# Patient Record
Sex: Male | Born: 1970 | Race: White | Hispanic: No | Marital: Married | Smoking: Never smoker
Health system: Southern US, Community
[De-identification: ages and names within clinical notes are randomized; demographics above are authoritative.]

## PROBLEM LIST (undated history)

## (undated) DIAGNOSIS — F909 Attention-deficit hyperactivity disorder, unspecified type: Secondary | ICD-10-CM

## (undated) DIAGNOSIS — I10 Essential (primary) hypertension: Secondary | ICD-10-CM

## (undated) DIAGNOSIS — I639 Cerebral infarction, unspecified: Secondary | ICD-10-CM

## (undated) DIAGNOSIS — E785 Hyperlipidemia, unspecified: Secondary | ICD-10-CM

## (undated) HISTORY — DX: Cerebral infarction, unspecified: I63.9

## (undated) HISTORY — DX: Hyperlipidemia, unspecified: E78.5

## (undated) HISTORY — PX: TONSILECTOMY/ADENOIDECTOMY WITH MYRINGOTOMY: SHX6125

---

## 2018-01-13 MED FILL — VYVANSE 50 MG CAPSULE: 50 | 30 days supply | Qty: 30 | Fill #0

## 2018-01-26 DIAGNOSIS — Z638 Other specified problems related to primary support group: Secondary | ICD-10-CM | POA: Diagnosis not present

## 2018-01-26 DIAGNOSIS — Z6911 Encounter for mental health services for victim of spousal or partner abuse: Secondary | ICD-10-CM | POA: Diagnosis not present

## 2018-01-26 DIAGNOSIS — F411 Generalized anxiety disorder: Secondary | ICD-10-CM | POA: Diagnosis not present

## 2018-02-01 DIAGNOSIS — Z6911 Encounter for mental health services for victim of spousal or partner abuse: Secondary | ICD-10-CM | POA: Diagnosis not present

## 2018-02-01 DIAGNOSIS — F411 Generalized anxiety disorder: Secondary | ICD-10-CM | POA: Diagnosis not present

## 2018-02-01 DIAGNOSIS — Z638 Other specified problems related to primary support group: Secondary | ICD-10-CM | POA: Diagnosis not present

## 2018-02-10 MED FILL — valACYclovir HCL 1 GM TABS: 1 | 10 days supply | Qty: 30 | Fill #0

## 2018-02-10 MED FILL — DOXYCYCLINE HYCLATE 100 MG: 100 | 10 days supply | Qty: 20 | Fill #0

## 2018-02-10 MED FILL — VYVANSE 50 MG CAPSULE: 50 | 30 days supply | Qty: 30 | Fill #0

## 2018-02-15 DIAGNOSIS — Z6911 Encounter for mental health services for victim of spousal or partner abuse: Secondary | ICD-10-CM | POA: Diagnosis not present

## 2018-02-15 DIAGNOSIS — Z638 Other specified problems related to primary support group: Secondary | ICD-10-CM | POA: Diagnosis not present

## 2018-02-15 DIAGNOSIS — F411 Generalized anxiety disorder: Secondary | ICD-10-CM | POA: Diagnosis not present

## 2018-02-22 DIAGNOSIS — Z638 Other specified problems related to primary support group: Secondary | ICD-10-CM | POA: Diagnosis not present

## 2018-02-22 DIAGNOSIS — Z6911 Encounter for mental health services for victim of spousal or partner abuse: Secondary | ICD-10-CM | POA: Diagnosis not present

## 2018-02-22 DIAGNOSIS — F411 Generalized anxiety disorder: Secondary | ICD-10-CM | POA: Diagnosis not present

## 2018-02-24 DIAGNOSIS — R030 Elevated blood-pressure reading, without diagnosis of hypertension: Secondary | ICD-10-CM | POA: Diagnosis not present

## 2018-02-24 DIAGNOSIS — R079 Chest pain, unspecified: Secondary | ICD-10-CM | POA: Diagnosis not present

## 2018-02-24 DIAGNOSIS — R0609 Other forms of dyspnea: Secondary | ICD-10-CM | POA: Diagnosis not present

## 2018-02-24 DIAGNOSIS — E782 Mixed hyperlipidemia: Secondary | ICD-10-CM | POA: Diagnosis not present

## 2018-03-08 DIAGNOSIS — Z6911 Encounter for mental health services for victim of spousal or partner abuse: Secondary | ICD-10-CM | POA: Diagnosis not present

## 2018-03-08 DIAGNOSIS — Z638 Other specified problems related to primary support group: Secondary | ICD-10-CM | POA: Diagnosis not present

## 2018-03-08 DIAGNOSIS — F411 Generalized anxiety disorder: Secondary | ICD-10-CM | POA: Diagnosis not present

## 2018-03-11 MED FILL — ROSUVASTATIN CALCIUM 20 MG: 20 | 30 days supply | Qty: 30 | Fill #0

## 2018-03-15 MED FILL — VYVANSE 50 MG CAPSULE: 50 | 30 days supply | Qty: 30 | Fill #0

## 2018-03-16 DIAGNOSIS — Z6911 Encounter for mental health services for victim of spousal or partner abuse: Secondary | ICD-10-CM | POA: Diagnosis not present

## 2018-03-16 DIAGNOSIS — F411 Generalized anxiety disorder: Secondary | ICD-10-CM | POA: Diagnosis not present

## 2018-03-16 DIAGNOSIS — Z638 Other specified problems related to primary support group: Secondary | ICD-10-CM | POA: Diagnosis not present

## 2018-03-22 DIAGNOSIS — R0789 Other chest pain: Secondary | ICD-10-CM | POA: Diagnosis not present

## 2018-03-22 DIAGNOSIS — Z6911 Encounter for mental health services for victim of spousal or partner abuse: Secondary | ICD-10-CM | POA: Diagnosis not present

## 2018-03-22 DIAGNOSIS — Z638 Other specified problems related to primary support group: Secondary | ICD-10-CM | POA: Diagnosis not present

## 2018-03-22 DIAGNOSIS — F411 Generalized anxiety disorder: Secondary | ICD-10-CM | POA: Diagnosis not present

## 2018-03-29 MED FILL — JORNAY PM 20 MG CP24: 20 | 30 days supply | Qty: 30 | Fill #0

## 2018-04-05 DIAGNOSIS — I10 Essential (primary) hypertension: Secondary | ICD-10-CM | POA: Diagnosis not present

## 2018-04-05 DIAGNOSIS — R079 Chest pain, unspecified: Secondary | ICD-10-CM | POA: Diagnosis not present

## 2018-04-06 DIAGNOSIS — R030 Elevated blood-pressure reading, without diagnosis of hypertension: Secondary | ICD-10-CM | POA: Diagnosis not present

## 2018-04-06 DIAGNOSIS — E782 Mixed hyperlipidemia: Secondary | ICD-10-CM | POA: Diagnosis not present

## 2018-04-08 DIAGNOSIS — R079 Chest pain, unspecified: Secondary | ICD-10-CM | POA: Diagnosis not present

## 2018-04-08 DIAGNOSIS — E782 Mixed hyperlipidemia: Secondary | ICD-10-CM | POA: Diagnosis not present

## 2018-04-08 DIAGNOSIS — R0609 Other forms of dyspnea: Secondary | ICD-10-CM | POA: Diagnosis not present

## 2018-04-08 DIAGNOSIS — I10 Essential (primary) hypertension: Secondary | ICD-10-CM | POA: Diagnosis not present

## 2018-04-08 MED FILL — VALSARTAN 160 MG TABLET: 160 | 30 days supply | Qty: 30 | Fill #0

## 2018-04-13 MED FILL — VYVANSE 50 MG CAPSULE: 50 | 30 days supply | Qty: 30 | Fill #0

## 2018-04-19 DIAGNOSIS — I10 Essential (primary) hypertension: Secondary | ICD-10-CM | POA: Diagnosis not present

## 2018-04-30 DIAGNOSIS — R079 Chest pain, unspecified: Secondary | ICD-10-CM | POA: Diagnosis not present

## 2018-04-30 DIAGNOSIS — E782 Mixed hyperlipidemia: Secondary | ICD-10-CM | POA: Diagnosis not present

## 2018-05-03 DIAGNOSIS — Z638 Other specified problems related to primary support group: Secondary | ICD-10-CM | POA: Diagnosis not present

## 2018-05-03 DIAGNOSIS — F411 Generalized anxiety disorder: Secondary | ICD-10-CM | POA: Diagnosis not present

## 2018-05-03 DIAGNOSIS — Z6911 Encounter for mental health services for victim of spousal or partner abuse: Secondary | ICD-10-CM | POA: Diagnosis not present

## 2018-05-04 MED FILL — ROSUVASTATIN CALCIUM 20 MG: 20 | 30 days supply | Qty: 30 | Fill #1

## 2018-05-05 MED FILL — VALSARTAN 160 MG TABLET: 160 | 30 days supply | Qty: 30 | Fill #1

## 2018-05-10 MED FILL — VYVANSE 50 MG CAPSULE: 50 | 30 days supply | Qty: 30 | Fill #0

## 2018-06-08 MED FILL — VYVANSE 50 MG CAPSULE: 50 | 30 days supply | Qty: 30 | Fill #0

## 2018-07-02 MED FILL — ROSUVASTATIN CALCIUM 20 MG: 20 | 90 days supply | Qty: 90 | Fill #0

## 2018-07-02 MED FILL — VALSARTAN 160 MG TABLET: 160 | 90 days supply | Qty: 90 | Fill #0

## 2018-07-06 MED FILL — VYVANSE 50 MG CAPSULE: 50 | 30 days supply | Qty: 30 | Fill #0

## 2018-07-20 DIAGNOSIS — Z6911 Encounter for mental health services for victim of spousal or partner abuse: Secondary | ICD-10-CM | POA: Diagnosis not present

## 2018-07-20 DIAGNOSIS — F411 Generalized anxiety disorder: Secondary | ICD-10-CM | POA: Diagnosis not present

## 2018-07-20 DIAGNOSIS — Z638 Other specified problems related to primary support group: Secondary | ICD-10-CM | POA: Diagnosis not present

## 2018-08-03 MED FILL — VYVANSE 50 MG CAPSULE: 50 | 30 days supply | Qty: 30 | Fill #0

## 2018-09-06 MED FILL — VYVANSE 50 MG CAPSULE: 50 | 30 days supply | Qty: 30 | Fill #0

## 2018-10-06 MED FILL — VYVANSE 50 MG CAPSULE: 50 | 30 days supply | Qty: 30 | Fill #0

## 2018-11-03 MED FILL — VYVANSE 60 MG CAPSULE: 60 | 30 days supply | Qty: 30 | Fill #0

## 2018-12-07 MED FILL — VYVANSE 60 MG CAPSULE: 60 | 30 days supply | Qty: 30 | Fill #0

## 2018-12-17 DIAGNOSIS — E785 Hyperlipidemia, unspecified: Secondary | ICD-10-CM | POA: Diagnosis not present

## 2018-12-17 DIAGNOSIS — I10 Essential (primary) hypertension: Secondary | ICD-10-CM | POA: Diagnosis not present

## 2018-12-17 DIAGNOSIS — Z0000 Encounter for general adult medical examination without abnormal findings: Secondary | ICD-10-CM | POA: Diagnosis not present

## 2018-12-24 ENCOUNTER — Other Ambulatory Visit: Payer: Self-pay

## 2018-12-24 ENCOUNTER — Other Ambulatory Visit: Payer: Self-pay | Admitting: Nurse Practitioner

## 2018-12-24 ENCOUNTER — Ambulatory Visit
Admission: RE | Admit: 2018-12-24 | Discharge: 2018-12-24 | Disposition: A | Discharge status: Home / Self Care | Payer: 59 | Source: Ambulatory Visit | Attending: Nurse Practitioner | Admitting: Nurse Practitioner

## 2018-12-24 DIAGNOSIS — M25522 Pain in left elbow: Secondary | ICD-10-CM

## 2018-12-24 DIAGNOSIS — S59902A Unspecified injury of left elbow, initial encounter: Secondary | ICD-10-CM | POA: Diagnosis not present

## 2019-01-08 MED FILL — VYVANSE 50 MG CAPSULE: 50 | 30 days supply | Qty: 30 | Fill #0

## 2019-02-08 MED FILL — VYVANSE 50 MG CAPSULE: 50 | 30 days supply | Qty: 30 | Fill #0

## 2019-03-01 DIAGNOSIS — M25522 Pain in left elbow: Secondary | ICD-10-CM | POA: Diagnosis not present

## 2019-03-01 DIAGNOSIS — M7712 Lateral epicondylitis, left elbow: Secondary | ICD-10-CM | POA: Diagnosis not present

## 2019-03-11 MED FILL — VYVANSE 50 MG CAPSULE: 50 | 30 days supply | Qty: 30 | Fill #0

## 2019-04-11 MED FILL — VYVANSE 50 MG CAPSULE: 50 | 30 days supply | Qty: 30 | Fill #0

## 2019-05-10 MED FILL — VYVANSE 50 MG CAPSULE: 50 | 30 days supply | Qty: 30 | Fill #0

## 2019-06-09 MED FILL — VYVANSE 50 MG CAPSULE: 50 | 30 days supply | Qty: 30 | Fill #0

## 2019-07-08 MED FILL — VYVANSE 50 MG CAPSULE: 50 | 30 days supply | Qty: 30 | Fill #0

## 2019-08-06 MED FILL — VYVANSE 50 MG CAPSULE: 50 | 30 days supply | Qty: 30 | Fill #0

## 2019-09-03 MED FILL — VYVANSE 50 MG CAPSULE: 50 | 30 days supply | Qty: 30 | Fill #0

## 2019-09-15 DIAGNOSIS — R079 Chest pain, unspecified: Secondary | ICD-10-CM | POA: Diagnosis not present

## 2019-09-15 DIAGNOSIS — I10 Essential (primary) hypertension: Secondary | ICD-10-CM | POA: Diagnosis not present

## 2019-09-15 DIAGNOSIS — R41840 Attention and concentration deficit: Secondary | ICD-10-CM | POA: Diagnosis not present

## 2019-09-15 DIAGNOSIS — R1084 Generalized abdominal pain: Secondary | ICD-10-CM | POA: Diagnosis not present

## 2019-09-19 ENCOUNTER — Other Ambulatory Visit: Payer: Self-pay | Admitting: Primary Care

## 2019-09-19 DIAGNOSIS — R1084 Generalized abdominal pain: Secondary | ICD-10-CM

## 2019-10-03 MED FILL — VYVANSE 50 MG CAPSULE: 50 | 30 days supply | Qty: 30 | Fill #0

## 2019-11-04 MED FILL — VYVANSE 50 MG CAPSULE: 50 | 30 days supply | Qty: 30 | Fill #0

## 2019-12-02 MED FILL — VYVANSE 50 MG CAPSULE: 50 | 30 days supply | Qty: 30 | Fill #0

## 2019-12-26 MED FILL — POLYMYXIN B/TMP EYE DROPS: 10000-0.1 | 7 days supply | Qty: 10 | Fill #0

## 2019-12-31 MED FILL — VYVANSE 50 MG CAPSULE: 50 | 30 days supply | Qty: 30 | Fill #0

## 2020-01-31 MED FILL — VYVANSE 50 MG CAPSULE: 50 | 30 days supply | Qty: 30 | Fill #0

## 2020-02-11 ENCOUNTER — Other Ambulatory Visit: Payer: Self-pay

## 2020-02-11 ENCOUNTER — Emergency Department (HOSPITAL_COMMUNITY)
Admission: EM | Admit: 2020-02-11 | Discharge: 2020-02-12 | Disposition: A | Discharge status: Home / Self Care | Payer: 59 | Attending: Emergency Medicine | Admitting: Emergency Medicine

## 2020-02-11 ENCOUNTER — Encounter (HOSPITAL_COMMUNITY): Payer: Self-pay | Admitting: Emergency Medicine

## 2020-02-11 DIAGNOSIS — Z01818 Encounter for other preprocedural examination: Secondary | ICD-10-CM | POA: Diagnosis present

## 2020-02-11 DIAGNOSIS — I10 Essential (primary) hypertension: Secondary | ICD-10-CM | POA: Insufficient documentation

## 2020-02-11 DIAGNOSIS — F4324 Adjustment disorder with disturbance of conduct: Secondary | ICD-10-CM | POA: Insufficient documentation

## 2020-02-11 DIAGNOSIS — R000 Tachycardia, unspecified: Secondary | ICD-10-CM | POA: Insufficient documentation

## 2020-02-11 DIAGNOSIS — R45851 Suicidal ideations: Secondary | ICD-10-CM

## 2020-02-11 DIAGNOSIS — Z046 Encounter for general psychiatric examination, requested by authority: Secondary | ICD-10-CM | POA: Diagnosis not present

## 2020-02-11 DIAGNOSIS — F909 Attention-deficit hyperactivity disorder, unspecified type: Secondary | ICD-10-CM | POA: Diagnosis not present

## 2020-02-11 DIAGNOSIS — R259 Unspecified abnormal involuntary movements: Secondary | ICD-10-CM | POA: Diagnosis not present

## 2020-02-11 DIAGNOSIS — F4320 Adjustment disorder, unspecified: Secondary | ICD-10-CM

## 2020-02-11 HISTORY — DX: Attention-deficit hyperactivity disorder, unspecified type: F90.9

## 2020-02-11 HISTORY — DX: Essential (primary) hypertension: I10

## 2020-02-11 LAB — RAPID URINE DRUG SCREEN, HOSP PERFORMED
Amphetamines: POSITIVE — AB
Barbiturates: NOT DETECTED
Benzodiazepines: NOT DETECTED
Cocaine: NOT DETECTED
Opiates: NOT DETECTED
Tetrahydrocannabinol: NOT DETECTED

## 2020-02-11 LAB — COMPREHENSIVE METABOLIC PANEL
ALT: 20 U/L (ref 0–44)
AST: 17 U/L (ref 15–41)
Albumin: 4.9 g/dL (ref 3.5–5.0)
Alkaline Phosphatase: 59 U/L (ref 38–126)
Anion gap: 11 (ref 5–15)
BUN: 12 mg/dL (ref 6–20)
CO2: 26 mmol/L (ref 22–32)
Calcium: 8.9 mg/dL (ref 8.9–10.3)
Chloride: 100 mmol/L (ref 98–111)
Creatinine, Ser: 1.06 mg/dL (ref 0.61–1.24)
GFR calc Af Amer: >60 mL/min (ref >60)
GFR calc non Af Amer: >60 mL/min (ref >60)
Glucose, Bld: 98 mg/dL (ref 70–99)
Potassium: 2.9 mmol/L — ABNORMAL LOW (ref 3.5–5.1)
Sodium: 137 mmol/L (ref 135–145)
Total Bilirubin: 0.9 mg/dL (ref 0.3–1.2)
Total Protein: 8 g/dL (ref 6.5–8.1)

## 2020-02-11 LAB — SALICYLATE LEVEL: Salicylate Lvl: <7 mg/dL — ABNORMAL LOW (ref 7.0–30.0)

## 2020-02-11 LAB — ETHANOL: Alcohol, Ethyl (B): <10 mg/dL (ref <10)

## 2020-02-11 LAB — ACETAMINOPHEN LEVEL: Acetaminophen (Tylenol), Serum: <10 ug/mL — ABNORMAL LOW (ref 10–30)

## 2020-02-11 MED ORDER — POTASSIUM CHLORIDE CRYS ER 20 MEQ PO TBCR
40.0000 meq | EXTENDED_RELEASE_TABLET | Freq: Once | ORAL | Status: AC
  Administered 2020-02-12: 40 meq via ORAL
  Filled 2020-02-11: qty 2

## 2020-02-11 NOTE — ED Notes (Signed)
Patient here with [employer] who states that the patient has threatened to harm himself and his wife, and has destroyed items in his home.  Per IVC paperwork patient wrote a letter to his wife " I hate you and you hate me so let's go kill each other".

## 2020-02-11 NOTE — ED Notes (Signed)
Patient states to this RN that he did not mean the things that he said to his wife, states that he he said them out of frustration.

## 2020-02-11 NOTE — ED Provider Notes (Signed)
[facility]-EMERGENCY DEPT Provider Note   CSN: [id number] Arrival date & time: 02/11/20  2153     History Chief Complaint  Patient presents with  . Medical Clearance    Charles Farley is a 49 y.o. male with past medical history significant for ADHD, hypertension.  HPI Patient presents to emergency department today in [employer] custody after his wife took out IVC orders earlier this evening.  Patient states he has been fighting with his wife frequently.  She travels for work and he is bothered that she is away from the home so much. She returned home from work tonight and started to say things that bothered patient.  He states "she knows what to say to push my buttons." He denies any drug or alcohol use.  IVC paperwork states patient wrote a letter to his wife saying I hate you and you hate me so let's go kill each other.  He denies this.  He states he sent this in a text message x4 days ago.  He denies any current suicidal or homicidal ideations currently.  It was also noted on IVC paperwork that patient had become angry in the home in turned over many items.  He had also threatened to harm family members.      Past Medical History:  Diagnosis Date  . ADHD   . Hypertension     There are no problems to display for this patient.    No family history on file.  Social History   Tobacco Use  . Smoking status: Never Smoker  . Smokeless tobacco: Never Used  Substance Use Topics  . Alcohol use: Not Currently  . Drug use: Never    Home Medications Prior to Admission medications   Not on File    Allergies    Patient has no known allergies.  Review of Systems   Review of Systems  All other systems are reviewed and are negative for acute change except as noted in the HPI.   Physical Exam Updated Vital Signs BP (!) 168/93 (BP Location: Right Arm)   Pulse (!) 110   Temp 98.2 F (36.8 C) (Oral)   Resp 18   Ht 6' (1.829 m)   Wt 129.3 kg   SpO2 98%    BMI 38.65 kg/m   Physical Exam Vitals and nursing note reviewed.  Constitutional:      General: He is not in acute distress.    Appearance: He is not ill-appearing.  HENT:     Head: Normocephalic and atraumatic.     Right Ear: Tympanic membrane and external ear normal.     Left Ear: Tympanic membrane and external ear normal.     Nose: Nose normal.     Mouth/Throat:     Mouth: Mucous membranes are moist.     Pharynx: Oropharynx is clear.  Eyes:     General: No scleral icterus.       Right eye: No discharge.        Left eye: No discharge.     Extraocular Movements: Extraocular movements intact.     Conjunctiva/sclera: Conjunctivae normal.     Pupils: Pupils are equal, round, and reactive to light.  Neck:     Vascular: No JVD.  Cardiovascular:     Rate and Rhythm: Regular rhythm. Tachycardia present.     Pulses: Normal pulses.          Radial pulses are 2+ on the right side and 2+ on the left  side.     Heart sounds: Normal heart sounds.  Pulmonary:     Comments: Lungs clear to auscultation in all fields. Symmetric chest rise. No wheezing, rales, or rhonchi. Abdominal:     Comments: Abdomen is soft, non-distended, and non-tender in all quadrants. No rigidity, no guarding. No peritoneal signs.  Musculoskeletal:        General: Normal range of motion.     Cervical back: Normal range of motion.  Skin:    General: Skin is warm and dry.     Capillary Refill: Capillary refill takes less than 2 seconds.  Neurological:     Mental Status: He is oriented to person, place, and time.     GCS: GCS eye subscore is 4. GCS verbal subscore is 5. GCS motor subscore is 6.     Comments: Fluent speech, no facial droop.  Psychiatric:        Mood and Affect: Affect is inappropriate.        Speech: Speech normal.        Behavior: Behavior normal.        Thought Content: Thought content does not include homicidal or suicidal ideation. Thought content does not include homicidal or suicidal plan.         Judgment: Judgment is impulsive.     ED Results / Procedures / Treatments   Labs (all labs ordered are listed, but only abnormal results are displayed) Labs Reviewed  COMPREHENSIVE METABOLIC PANEL - Abnormal; Notable for the following components:      Result Value   Potassium 2.9 (*)    All other components within normal limits  SALICYLATE LEVEL - Abnormal; Notable for the following components:   Salicylate Lvl <7.0 (*)    All other components within normal limits  ACETAMINOPHEN LEVEL - Abnormal; Notable for the following components:   Acetaminophen (Tylenol), Serum <10 (*)    All other components within normal limits  CBC - Abnormal; Notable for the following components:   WBC 15.7 (*)    All other components within normal limits  RAPID URINE DRUG SCREEN, HOSP PERFORMED - Abnormal; Notable for the following components:   Amphetamines POSITIVE (*)    All other components within normal limits  ETHANOL    EKG None  Radiology No results found.  Procedures Procedures (including critical care time)  Medications Ordered in ED Medications  potassium chloride SA (KLOR-CON) CR tablet 40 mEq (40 mEq Oral Given 02/12/20 0024)    ED Course  I have reviewed the triage vital signs and the nursing notes.  Pertinent labs & imaging results that were available during my care of the patient were reviewed by me and considered in my medical decision making (see chart for details).    MDM Rules/Calculators/A&P                          History provided by patient with additional history obtained from chart review.    No medical complaints today.  Labs ordered and reviewed. CMP shows hypokalemia 2.9, replaced with PO. Nonspecific leukocytosis, 15.7. He is afebrile and denies any infectious symptoms. When vitals rechecked tachycardia has resolved. UDS positive for amphetamines. First exam filed out.  Pt is medically cleared for TTS evaluation, disposition per [facility].   History  provided by patient with additional history obtained from chart review.     Final Clinical Impression(s) / ED Diagnoses Final diagnoses:  Involuntary commitment    Rx / DC  Orders ED Discharge Orders    None       Kathyrn Lass 02/12/20 Lindi Adie, MD 02/12/20 [phone]

## 2020-02-11 NOTE — ED Notes (Signed)
ED Provider at bedside. 

## 2020-02-11 NOTE — ED Triage Notes (Signed)
Patient arrives IVC'd by wife. Patient states that him and his wife have been arguing due to her traveling a lot. Patient states that they got into it today and patient wrote a letter to his wife that stated, "I hate you and you hate me so let's go kill each other." Patient admits to writing this note due to being upset and he did not mean it. Patient denies SI/HI at this time.

## 2020-02-11 NOTE — ED Notes (Signed)
Placed Pt's shirt, pants, shoes, phone, keys, and wallet in a Pt's belongings bag and put away.

## 2020-02-12 DIAGNOSIS — F4324 Adjustment disorder with disturbance of conduct: Secondary | ICD-10-CM | POA: Diagnosis not present

## 2020-02-12 DIAGNOSIS — F909 Attention-deficit hyperactivity disorder, unspecified type: Secondary | ICD-10-CM

## 2020-02-12 DIAGNOSIS — R45851 Suicidal ideations: Secondary | ICD-10-CM

## 2020-02-12 DIAGNOSIS — R000 Tachycardia, unspecified: Secondary | ICD-10-CM | POA: Diagnosis not present

## 2020-02-12 DIAGNOSIS — F4320 Adjustment disorder, unspecified: Secondary | ICD-10-CM | POA: Diagnosis not present

## 2020-02-12 DIAGNOSIS — Z046 Encounter for general psychiatric examination, requested by authority: Secondary | ICD-10-CM | POA: Diagnosis not present

## 2020-02-12 DIAGNOSIS — I10 Essential (primary) hypertension: Secondary | ICD-10-CM | POA: Diagnosis not present

## 2020-02-12 LAB — CBC
HCT: 45.4 % (ref 39.0–52.0)
Hemoglobin: 15.6 g/dL (ref 13.0–17.0)
MCH: 28.8 pg (ref 26.0–34.0)
MCHC: 34.4 g/dL (ref 30.0–36.0)
MCV: 83.9 fL (ref 80.0–100.0)
Platelets: 355 10*3/uL (ref 150–400)
RBC: 5.41 MIL/uL (ref 4.22–5.81)
RDW: 13.3 % (ref 11.5–15.5)
WBC: 15.7 10*3/uL — ABNORMAL HIGH (ref 4.0–10.5)
nRBC: 0 % (ref 0.0–0.2)

## 2020-02-12 NOTE — ED Provider Notes (Signed)
Psychiatry called report that patient has been psychiatrically cleared and they feel he safe for discharge home.  They request hours in the IVC.  I assessed the patient and he denies any complaints and is resting comfortably.  He agrees with discharge home.  Patient was given resources and we discharged after IVC is rescinded.     Charles Farley, Canary Brim, MD 02/12/20 1455

## 2020-02-12 NOTE — BH Assessment (Signed)
Tele Assessment Note   Patient Name: Charles Farley MRN: [id number] Referring Physician: Doug Sou, PA-C Location of Patient: [facility] ED Location of Provider: Behavioral Health TTS Department  Charles Farley is a 49 y.o. male who was brought involuntarily by police to [facility] under an IVC order that was filed by pt's wife. The IVC states:  "Respondent has been diagnosed with ADHD. Today he threatened to kill himself however, he doesn't have an active plan. He wrote a letter to his wife "I hate you and you hate me so let's go kill each other." He destroyed the home by knocking over items. Respondent is verbally aggressive. He threatened to inflict bodily harm to family members."  When asked why he was brought to the hospital, pt states, "My wife and I have been having some issues. She came home an dI could tell she was upset and I asked her what was going on and I started arguing with her." Pt acknowledges he sent her a text message 3-4 days ago stating "I kill you, you kill me, then we're both dead;" he states the believes she chose to go to the magistrate today with this note because they had been arguing again today.  Pt denies SI or a hx of SI. He denies any prior attempts to kill himself or any plan to kill himself. He denies he's ever been hospitalized for mental health reasons. Pt denies HI or a hx of HI. He denies AVH, NSSIB, access to guns/weapons, engagement with the legal system, or SA.  Clinician attempted to make contact w/ pt's wife at the number provided in the IVC paperwork at [phone] but there was no answer and no voicemail available to leave a HIPAA-compliant voicemail message.   Pt is oriented x5. His recent and remote memory is intact. Pt was cooperative throughout the assessment process. Pt's insight and judgement is fair - poor; his impulse control is poor at this time.   Diagnosis: F43.24, Adjustment disorder, With disturbance of conduct   Past Medical History:  Past  Medical History:  Diagnosis Date  . ADHD   . Hypertension     Family History: No family history on file.  Social History:  reports that he has never smoked. He has never used smokeless tobacco. He reports previous alcohol use. He reports that he does not use drugs.  Additional Social History:  Alcohol / Drug Use Pain Medications: Please see MAR Prescriptions: Please see MAR Over the Counter: Please see MAR History of alcohol / drug use?: No history of alcohol / drug abuse Longest period of sobriety (when/how long): Please see MAR  CIWA: CIWA-Ar BP: (!) 193/91 Pulse Rate: 86 COWS:    Allergies: No Known Allergies  Home Medications: (Not in a hospital admission)   OB/GYN Status:  No LMP for male patient.  General Assessment Data Location of Assessment: [facility] ED TTS Assessment: In system Is this a Tele or Face-to-Face Assessment?: Tele Assessment Is this an Initial Assessment or a Re-assessment for this encounter?: Initial Assessment Patient Accompanied by:: N/A Language Other than English: No Living Arrangements: Other (Comment) (Pt lives at home with his wife) What gender do you identify as?: Male Date Telepsych consult ordered in [facility]: 02/11/20 Time Telepsych consult ordered in [facility]: 2328 Marital status: Married Living Arrangements: Spouse/significant other Can pt return to current living arrangement?: Yes Admission Status: Involuntary Petitioner: Family member Is patient capable of signing voluntary admission?: Yes Referral Source: Self/Family/Friend Insurance type: [employer]  Crisis Care Plan Living Arrangements: Spouse/significant other Legal Guardian: Other: (Self) Name of Psychiatrist: None Name of Therapist: None  Education Status Is patient currently in school?: Yes Current Grade: College Highest grade of school patient has completed: College Name of school: N/A Contact person: Self IEP information if applicable: N/A  Risk to self with  the past 6 months Suicidal Ideation:  (Pt denies; pt states made empty threat re: SI/HI w/ wife) Has patient been a risk to self within the past 6 months prior to admission? : No Suicidal Intent:  (Pt denies; pt states made empty threat re: SI/HI w/ wife) Has patient had any suicidal intent within the past 6 months prior to admission? : No Is patient at risk for suicide?:  (Pt denies; pt states made empty threat re: SI/HI w/ wife) Suicidal Plan?:  (Pt denies; pt states made empty threat re: SI/HI w/ wife) Has patient had any suicidal plan within the past 6 months prior to admission? : No Access to Means: No What has been your use of drugs/alcohol within the last 12 months?: Pt denies SA Previous Attempts/Gestures: No How many times?: 0 Other Self Harm Risks: Pt has been having marital difficulties Triggers for Past Attempts: None known Intentional Self Injurious Behavior: None Family Suicide History: No Recent stressful life event(s): Conflict (Comment), Financial Problems (Marital conflict w/ wife) Persecutory voices/beliefs?: No Depression: No Depression Symptoms: Guilt, Feeling worthless/self pity, Feeling angry/irritable Substance abuse history and/or treatment for substance abuse?: No Suicide prevention information given to non-admitted patients: Not applicable  Risk to Others within the past 6 months Homicidal Ideation:  (Pt denies; pt states made empty threat re: SI/HI w/ wife) Does patient have any lifetime risk of violence toward others beyond the six months prior to admission? : No Thoughts of Harm to Others: Yes-Currently Present (Pt acknowledges he & wife have exchanged terrible words) Comment - Thoughts of Harm to Others: Pt acknowledges he and his wife have been saying terrible things to one another due to marrital discord Current Homicidal Intent:  (Pt denies; he & wife have said things they don't mean) Current Homicidal Plan: No Access to Homicidal Means:  (Pt denies  access to guns/weapons) Identified Victim: None noted History of harm to others?:  (Pt denied hx of harm to others) Assessment of Violence: On admission Violent Behavior Description: None noted Does patient have access to weapons?: No (Pt denid having access to guns/weapons) Criminal Charges Pending?: No Does patient have a court date: No Is patient on probation?: No  Psychosis Hallucinations: Auditory Delusions: None noted  Mental Status Report Appearance/Hygiene: Unable to Assess Eye Contact: Unable to Assess Motor Activity: Unable to assess Speech: Logical/coherent Level of Consciousness: Alert Mood: Anxious, Pleasant Affect: Appropriate to circumstance Anxiety Level: Minimal Thought Processes: Coherent, Relevant Judgement: Partial Orientation: Person, Place, Time, Situation Obsessive Compulsive Thoughts/Behaviors: None  Cognitive Functioning Concentration: Fair Memory: Recent Intact, Remote Intact Is patient IDD: No Insight: Fair Impulse Control: Poor Appetite: Good Have you had any weight changes? : No Change Sleep: No Change Total Hours of Sleep: 7 (6-8) Vegetative Symptoms: None  ADLScreening [facility] Assessment Services) Patient's cognitive ability adequate to safely complete daily activities?: Yes Patient able to express need for assistance with ADLs?: Yes Independently performs ADLs?: Yes (appropriate for developmental age)  Prior Inpatient Therapy Prior Inpatient Therapy: No  Prior Outpatient Therapy Prior Outpatient Therapy: Yes Prior Therapy Dates: 3-4 years ago Prior Therapy Facilty/Provider(s): Pt cannot remember; had a therapist Reason for Treatment: Unknown Does  patient have an ACCT team?: No Does patient have Intensive In-House Services?  : No Does patient have [facility] services? : No Does patient have P4CC services?: No  ADL Screening (condition at time of admission) Patient's cognitive ability adequate to safely complete daily activities?:  Yes Is the patient deaf or have difficulty hearing?: No Does the patient have difficulty seeing, even when wearing glasses/contacts?: No Does the patient have difficulty concentrating, remembering, or making decisions?: No Patient able to express need for assistance with ADLs?: Yes Does the patient have difficulty dressing or bathing?: No Independently performs ADLs?: Yes (appropriate for developmental age) Does the patient have difficulty walking or climbing stairs?: No Weakness of Legs: None Weakness of Arms/Hands: None  Home Assistive Devices/Equipment Home Assistive Devices/Equipment: None  Therapy Consults (therapy consults require a physician order) PT Evaluation Needed: No OT Evalulation Needed: No SLP Evaluation Needed: No Abuse/Neglect Assessment (Assessment to be complete while patient is alone) Abuse/Neglect Assessment Can Be Completed: Yes Physical Abuse: Denies Verbal Abuse: Denies Sexual Abuse: Denies Exploitation of patient/patient's resources: Denies Self-Neglect: Denies Values / Beliefs Cultural Requests During Hospitalization: None Spiritual Requests During Hospitalization: None Consults Spiritual Care Consult Needed: No Transition of Care Team Consult Needed: No Advance Directives (For Healthcare) Does Patient Have a Medical Advance Directive?: No Would patient like information on creating a medical advance directive?: No - Patient declined          Disposition: Elenore Paddy, NP, reviewed pt's chart and information and determined pt should be observed overnight for safety and stability and re-assessed by psychiatry in the morning. This information was given to pt's providers at 0207.   Disposition Initial Assessment Completed for this Encounter: Yes Patient referred to: Other (Comment) (Pt will be observed overnight for safety and stability)  This service was provided via telemedicine using a 2-way, interactive audio and video technology.  Names  of all persons participating in this telemedicine service and their role in this encounter. Name: Charles Farley Role: Patient  Name: Elenore Paddy Role: Nurse Practitioner  Name: Kerman Passey Role: Clinician    Ralph Dowdy 02/12/2020 5:45 AM

## 2020-02-12 NOTE — Progress Notes (Signed)
Per Ophelia Shoulder NP, pt is pscyh clear; pt's wife has no safety concerns regarding pt returning home and pt is not endorsing SI/HI. Outpatient marriage counseling, individual therapy, and psychiatry assessment recommended.  CSW provided the following resources for patient (AVS/Discharge Instructions):   Marriage and Couples Counseling Resources that accept UMR insurance:  Zoe Lan, MS, CRC, LPC - [facility] Evelene Croon is one of the leading marriage counselors in the region of [place]. [address], [address], [place], [place] [address] DIRECTIONS Phone:  [phone]  Cell: [phone]  [facility], [facility] (individual and marriage counseling) [address]  [place], [place] [address] Call [facility] [phone]  STEVEN Evelena Peat, M.ED, NCC, LPC, [place], LCAS - [facility] [address] [place], [place] [address] Phone:  [phone]  [facility] health Outpatient (psychiatric medication management AND therapy offerred) [address] [place], [place], [place] [address] Phone: [phone]    Ethel Rana. Alan Ripper, MSW, LCSW Clinical Social Worker 02/12/2020 11:36 AM

## 2020-02-12 NOTE — Discharge Instructions (Signed)
Marriage and Couples Counseling Resources that accept UMR insurance:  Charles Lan, MS, CRC, LPC - [facility] is one of the leading marriage counselors in the region of [place]. [address], [place], [place] [address] DIRECTIONS Phone:  [phone]  Cell: [phone]  [facility], [facility] (individual and marriage counseling) [address]  [place], [place] [address] Call [facility] [phone]  STEVEN Evelena Peat, M.ED, NCC, LPC, [place], LCAS - [facility] [address] [place], [place] [address] Phone:  [phone]  [facility] health Outpatient (psychiatric medication management AND therapy offerred) [address] [place], [place] [address] Phone: [phone]

## 2020-02-12 NOTE — ED Notes (Signed)
Pt DC off unit to home per provider. Pt alert, calm, cooperative, no s/s of distress. DC information and resources given to reviewed with pt, pt knowledged understanding. Pt ambulatory off the unit, escorted by RN. Pt transported by family.
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
## 2020-02-12 NOTE — ED Notes (Signed)
Pt provided with an update on plan of care.  Report given to [facility] and Pt transferred to TCU.  1 belongings bag placed in [place] #30.

## 2020-02-12 NOTE — ED Notes (Signed)
Pt to room 30. Pt cooperative, no s/s of distress.

## 2020-02-13 IMAGING — CR LEFT ELBOW - COMPLETE 3+ VIEW
4 series · 4 of 4 positions shown · non-contrast
Comparison: None.

CLINICAL DATA: 47-year-old male with fall and trauma to the left
elbow. Continued pain in the lateral epicondyle.

EXAM:
LEFT ELBOW - COMPLETE 3+ VIEW

[x elbow ap left]
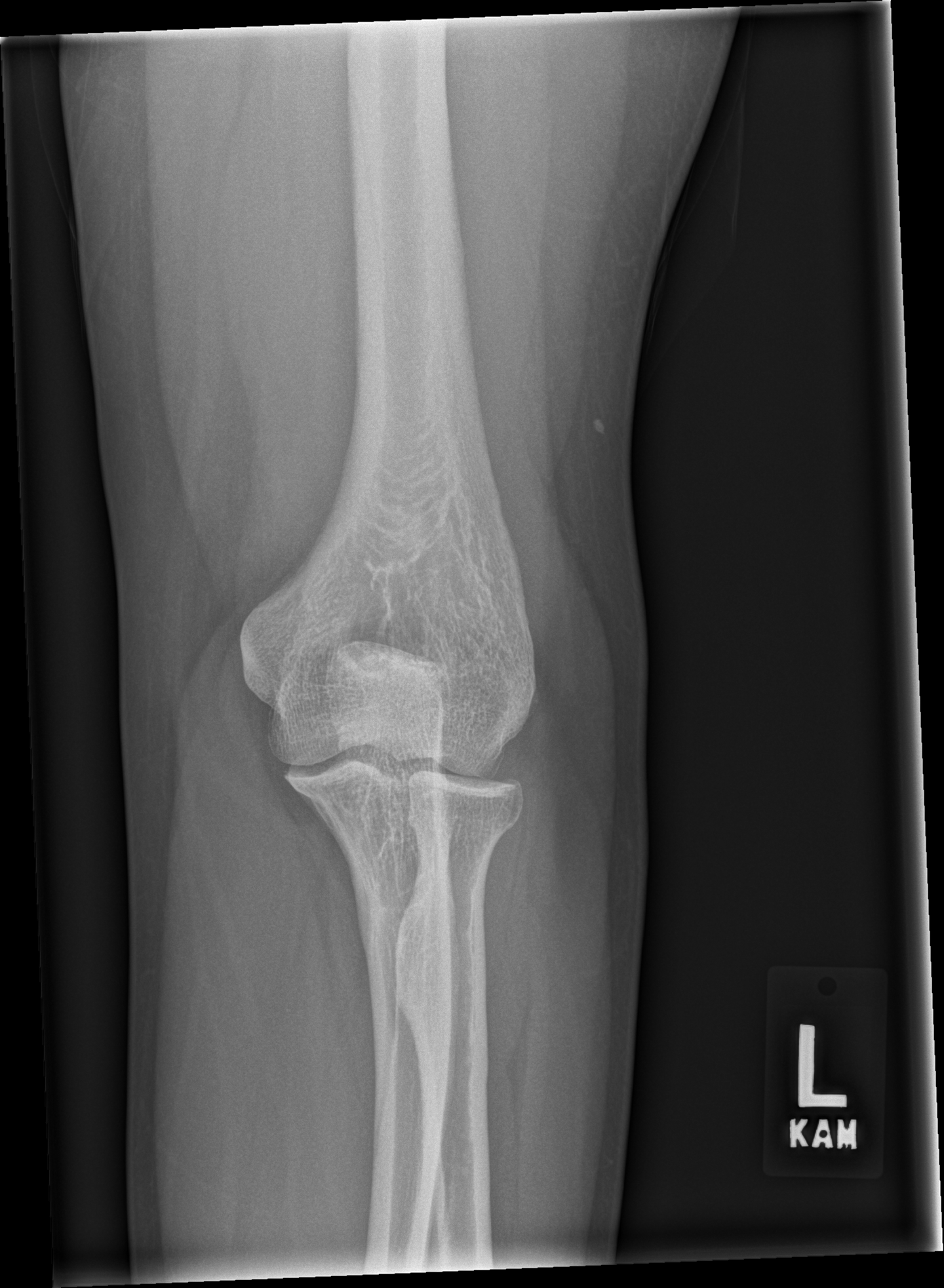

[x elbow obl left (1 of 2)]
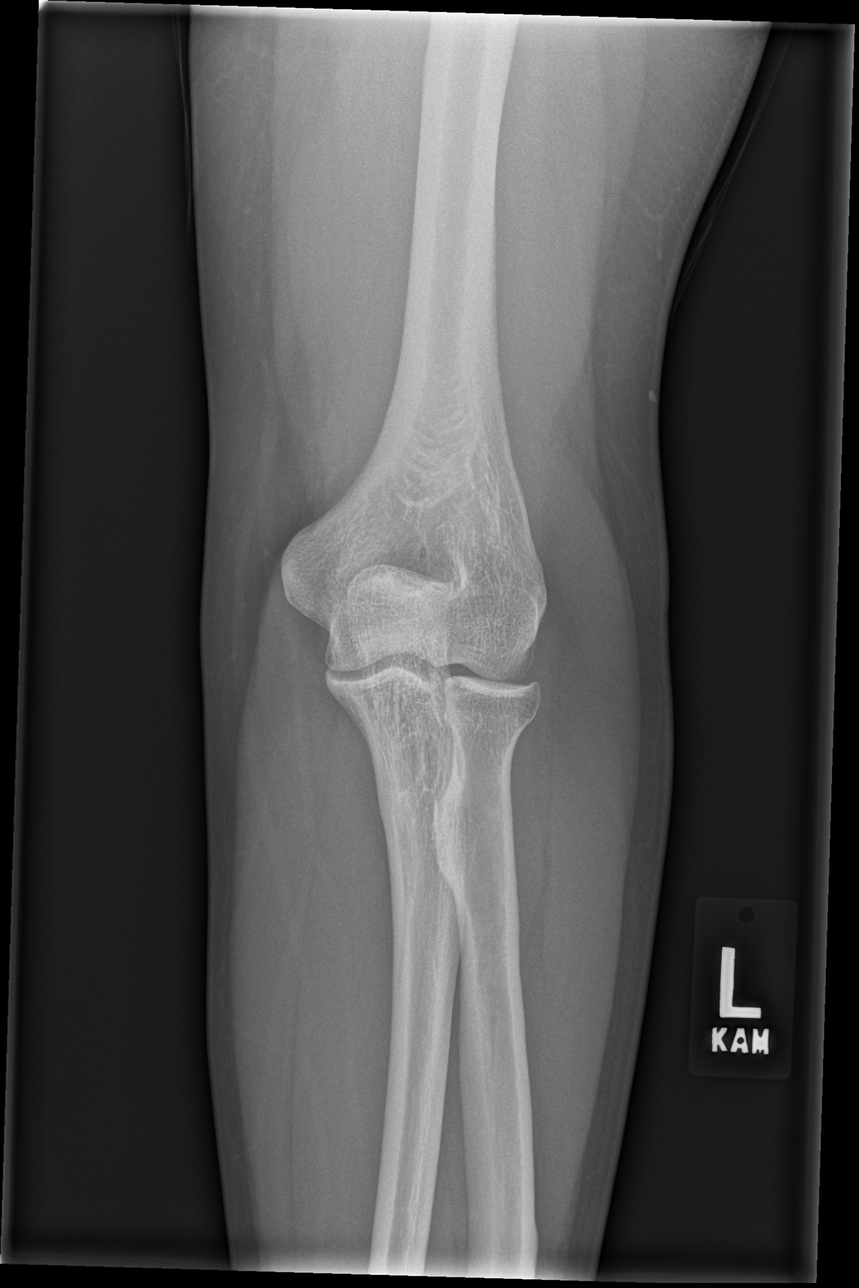

[x elbow lat left]
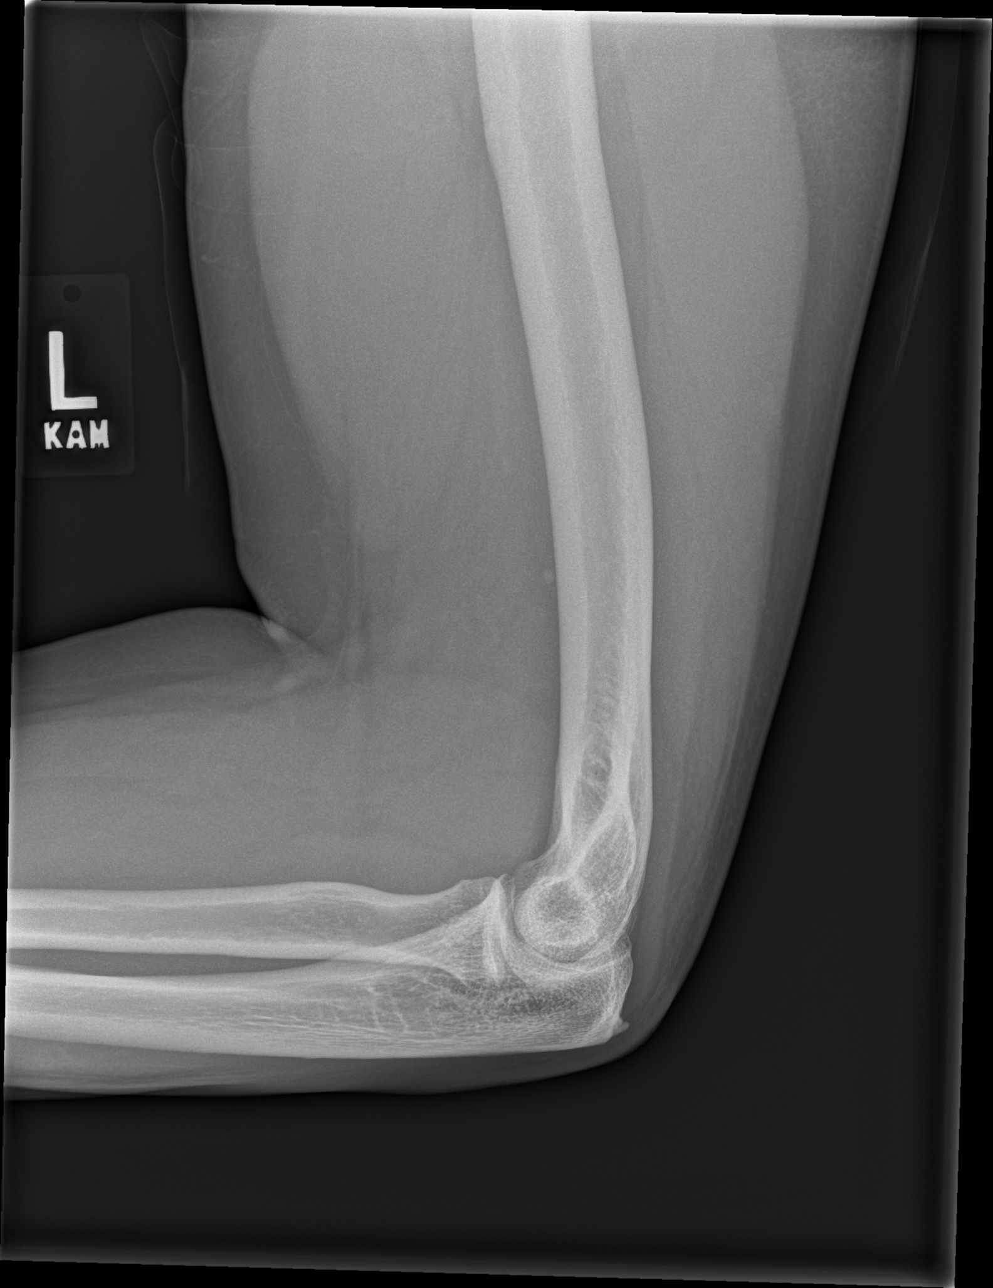

[x elbow obl left (2 of 2)]
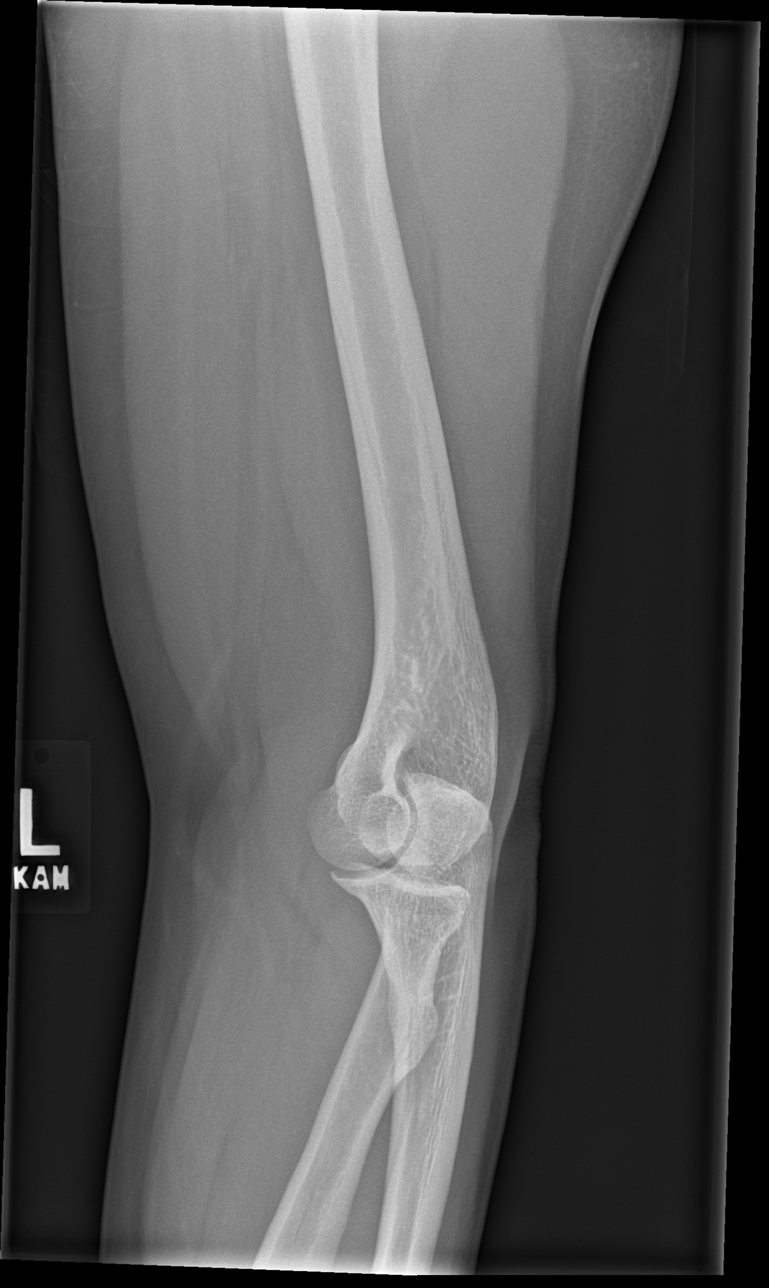

[4 of 4 positions shown; findings below may reference images not displayed]

FINDINGS: There is no acute fracture or dislocation. The bones are well
mineralized. No arthritic changes. No joint effusion. The soft
tissues are unremarkable.
IMPRESSION: Negative.

## 2020-02-16 MED FILL — CIPROFLOXACIN-DEXAMETHASONE: 0.3-0.1 | 9 days supply | Qty: 8 | Fill #0

## 2020-02-16 MED FILL — AMOXICILLIN-CLAV ER 1,000-6: 1000-62.5 | 10 days supply | Qty: 20 | Fill #0

## 2020-02-28 MED FILL — VALSARTAN-HCTZ 160-12.5 MG: 160-12.5 | 90 days supply | Qty: 90 | Fill #0

## 2020-02-28 MED FILL — ROSUVASTATIN CALCIUM 40 MG: 40 | 90 days supply | Qty: 90 | Fill #0

## 2020-03-03 ENCOUNTER — Other Ambulatory Visit (HOSPITAL_COMMUNITY): Payer: Self-pay | Admitting: Primary Care

## 2020-03-03 MED FILL — VYVANSE 50 MG CAPSULE: 50 | 30 days supply | Qty: 30 | Fill #0

## 2020-03-15 ENCOUNTER — Other Ambulatory Visit (HOSPITAL_COMMUNITY): Payer: Self-pay | Admitting: Primary Care

## 2020-03-15 MED FILL — VALSARTAN 160 MG TABS: 160 | 90 days supply | Qty: 90 | Fill #0

## 2020-03-15 MED FILL — ROSUVASTATIN CALCIUM 20 MG: 20 | 90 days supply | Qty: 90 | Fill #0

## 2020-03-28 ENCOUNTER — Other Ambulatory Visit (HOSPITAL_COMMUNITY): Payer: Self-pay

## 2020-03-31 MED FILL — VYVANSE 50 MG CAPSULE: 50 | 30 days supply | Qty: 30 | Fill #0

## 2020-04-09 ENCOUNTER — Other Ambulatory Visit (HOSPITAL_COMMUNITY): Payer: Self-pay | Admitting: Primary Care

## 2020-04-09 MED FILL — POLYMYXIN B/TMP EYE DROPS: 10000-0.1 | 7 days supply | Qty: 10 | Fill #0

## 2020-04-20 ENCOUNTER — Other Ambulatory Visit (HOSPITAL_COMMUNITY): Payer: Self-pay | Admitting: Primary Care

## 2020-04-20 MED FILL — AZITHROMYCIN 250 MG TABLET: 250 | 5 days supply | Qty: 6 | Fill #0

## 2020-04-20 MED FILL — NOREL AD TABLET: 4-10-325 | 5 days supply | Qty: 30 | Fill #0

## 2020-05-01 ENCOUNTER — Other Ambulatory Visit (HOSPITAL_COMMUNITY): Payer: Self-pay | Admitting: Primary Care

## 2020-05-02 MED FILL — VYVANSE 50 MG CAPSULE: 50 | 30 days supply | Qty: 30 | Fill #0

## 2020-06-01 ENCOUNTER — Other Ambulatory Visit (HOSPITAL_COMMUNITY): Payer: Self-pay | Admitting: Primary Care

## 2020-06-01 MED FILL — VYVANSE 50 MG CAPSULE: 50 | 30 days supply | Qty: 30 | Fill #0

## 2020-07-04 MED FILL — VYVANSE 50 MG CAPSULE: 50 | 30 days supply | Qty: 30 | Fill #0

## 2020-08-17 ENCOUNTER — Other Ambulatory Visit (HOSPITAL_COMMUNITY): Payer: Self-pay | Admitting: Unknown Physician Specialty

## 2020-08-18 ENCOUNTER — Other Ambulatory Visit (HOSPITAL_COMMUNITY): Payer: Self-pay

## 2020-08-18 MED FILL — Lisdexamfetamine Dimesylate Cap 50 MG: ORAL | 30 days supply | Qty: 30 | Fill #0 | Status: CN

## 2020-08-21 ENCOUNTER — Other Ambulatory Visit (HOSPITAL_COMMUNITY): Payer: Self-pay

## 2020-08-23 ENCOUNTER — Other Ambulatory Visit (HOSPITAL_COMMUNITY): Payer: Self-pay

## 2020-08-23 MED FILL — Lisdexamfetamine Dimesylate Cap 50 MG: ORAL | 30 days supply | Qty: 30 | Fill #0 | Status: CN

## 2020-08-24 ENCOUNTER — Other Ambulatory Visit (HOSPITAL_COMMUNITY): Payer: Self-pay

## 2020-08-27 ENCOUNTER — Other Ambulatory Visit (HOSPITAL_COMMUNITY): Payer: Self-pay

## 2021-01-17 ENCOUNTER — Other Ambulatory Visit (HOSPITAL_COMMUNITY): Payer: Self-pay

## 2021-01-25 DIAGNOSIS — Z23 Encounter for immunization: Secondary | ICD-10-CM | POA: Diagnosis not present

## 2021-01-25 DIAGNOSIS — Z0000 Encounter for general adult medical examination without abnormal findings: Secondary | ICD-10-CM | POA: Diagnosis not present

## 2021-01-25 DIAGNOSIS — Z1159 Encounter for screening for other viral diseases: Secondary | ICD-10-CM | POA: Diagnosis not present

## 2021-01-25 DIAGNOSIS — Z114 Encounter for screening for human immunodeficiency virus [HIV]: Secondary | ICD-10-CM | POA: Diagnosis not present

## 2021-01-25 DIAGNOSIS — E785 Hyperlipidemia, unspecified: Secondary | ICD-10-CM | POA: Diagnosis not present

## 2021-01-25 DIAGNOSIS — E669 Obesity, unspecified: Secondary | ICD-10-CM | POA: Diagnosis not present

## 2021-01-25 DIAGNOSIS — I10 Essential (primary) hypertension: Secondary | ICD-10-CM | POA: Diagnosis not present

## 2021-01-29 ENCOUNTER — Other Ambulatory Visit (HOSPITAL_COMMUNITY): Payer: Self-pay

## 2021-01-29 MED ORDER — ROSUVASTATIN CALCIUM 40 MG PO TABS
ORAL_TABLET | ORAL | 3 refills | Status: DC
Start: 2021-01-29 — End: 2022-03-05
  Filled 2021-01-29 – 2021-06-21 (×2): qty 90, 90d supply, fill #0

## 2021-01-29 MED ORDER — VALSARTAN 320 MG PO TABS
ORAL_TABLET | ORAL | 3 refills | Status: DC
Start: 2021-01-29 — End: 2022-03-05
  Filled 2021-01-29 – 2021-06-21 (×2): qty 90, 90d supply, fill #0

## 2021-02-06 ENCOUNTER — Other Ambulatory Visit (HOSPITAL_COMMUNITY): Payer: Self-pay

## 2021-02-17 ENCOUNTER — Other Ambulatory Visit: Payer: Self-pay

## 2021-02-17 ENCOUNTER — Ambulatory Visit
Admission: EM | Admit: 2021-02-17 | Discharge: 2021-02-17 | Disposition: A | Discharge status: Home / Self Care | Payer: 59 | Attending: Urgent Care | Admitting: Urgent Care

## 2021-02-17 ENCOUNTER — Encounter: Payer: Self-pay | Admitting: *Deleted

## 2021-02-17 DIAGNOSIS — M62838 Other muscle spasm: Secondary | ICD-10-CM

## 2021-02-17 DIAGNOSIS — M25512 Pain in left shoulder: Secondary | ICD-10-CM

## 2021-02-17 MED ORDER — TIZANIDINE HCL 4 MG PO TABS: 4.0000 mg | ORAL_TABLET | Freq: Every day | ORAL | 0 refills | Status: DC

## 2021-02-17 MED ORDER — NAPROXEN 375 MG PO TABS: 375.0000 mg | ORAL_TABLET | Freq: Two times a day (BID) | ORAL | 0 refills | Status: DC

## 2021-02-17 NOTE — ED Provider Notes (Signed)
[facility]   MRN: [id number] DOB: [birth year]  Subjective:   Charles Farley is a 50 y.o. male presenting for 1 day history of acute onset of left shoulder pain.  States that he woke up with the symptoms.  He can move his shoulder but hurts to rotate over the left hip.  No swelling.  He does feel like the pain can move upwards into his trapezius and base of the neck.  No fall, trauma, weakness, numbness or tingling, rashes.  No history of shoulder issues.  Has previously had a similar problem of the right shoulder and resolved with muscle relaxant.  He did take Aleve yesterday with some relief.  No history of heart disease, MI.  No current facility-administered medications for this encounter.  Current Outpatient Medications:    azithromycin (ZITHROMAX) 250 MG tablet, TAKE 2 TABLETS BY MOUTH ON DAY 1 THEN TAKE 1 TABLET DAILY FOR THE NEXT 4 DAYS, Disp: 6 tablet, Rfl: 0   Chlorphen-PE-Acetaminophen 4-10-325 MG TABS, TAKE 1 TABLET BY MOUTH EVERY 4 TO 6 HOURS AS NEEDED FOR 10 DAYS, Disp: 30 tablet, Rfl: 0   lisdexamfetamine (VYVANSE) 50 MG capsule, TAKE 1 CAPSULE BY MOUTH ONCE A DAY, Disp: 30 capsule, Rfl: 0   lisdexamfetamine (VYVANSE) 50 MG capsule, TAKE 1 CAPSULE BY MOUTH EVERY MORNING FOR ADD, Disp: 30 capsule, Rfl: 0   lisdexamfetamine (VYVANSE) 50 MG capsule, TAKE 1 CAPSULE BY MOUTH EVERY MORNING FOR ADD, Disp: 30 capsule, Rfl: 0   lisdexamfetamine (VYVANSE) 50 MG capsule, TAKE 1 CAPSULE BY MOUTH EVERY MORNING FOR ADD, Disp: 30 capsule, Rfl: 0   lisdexamfetamine (VYVANSE) 50 MG capsule, TAKE 1 CAPSULE BY MOUTH IN THE MORNING FOR ADD FOR 30 DAYS., Disp: 30 capsule, Rfl: 0   rosuvastatin (CRESTOR) 20 MG tablet, TAKE 1 TABLET BY MOUTH ONCE A DAY, Disp: 90 tablet, Rfl: 1   rosuvastatin (CRESTOR) 40 MG tablet, TAKE 1 TABLET BY MOUTH DAILY FOR CHOLESTEROL, Disp: 90 tablet, Rfl: 3   trimethoprim-polymyxin b (POLYTRIM) ophthalmic solution, PLACE 1 DROP INTO EACH EYE FOUR TIMES DAILY FOR 7  DAYS, Disp: 60 mL, Rfl: 1   valsartan (DIOVAN) 160 MG tablet, TAKE 1 TABLET BY MOUTH ONCE A DAY, Disp: 90 tablet, Rfl: 1   valsartan (DIOVAN) 320 MG tablet, TAKE ONE TABLET BY MOUTH DAILY FOR BLOOD PRESSURE., Disp: 90 tablet, Rfl: 3   VYVANSE 50 MG capsule, Take 50 mg by mouth every morning., Disp: , Rfl:    No Known Allergies  Past Medical History:  Diagnosis Date   ADHD    Hypertension      History reviewed. No pertinent surgical history.  History reviewed. No pertinent family history.  Social History   Tobacco Use   Smoking status: Never   Smokeless tobacco: Never  Substance Use Topics   Alcohol use: Not Currently   Drug use: Never    ROS   Objective:   Vitals: BP (!) 156/91   Pulse 71   Temp 98 F (36.7 C)   Resp (!) 185   SpO2 95%   Physical Exam Constitutional:      General: He is not in acute distress.    Appearance: Normal appearance. He is well-developed and normal weight. He is not ill-appearing, toxic-appearing or diaphoretic.  HENT:     Head: Normocephalic and atraumatic.     Right Ear: External ear normal.     Left Ear: External ear normal.     Nose: Nose normal.  Mouth/Throat:     Pharynx: Oropharynx is clear.  Eyes:     General: No scleral icterus.       Right eye: No discharge.        Left eye: No discharge.     Extraocular Movements: Extraocular movements intact.     Pupils: Pupils are equal, round, and reactive to light.  Cardiovascular:     Rate and Rhythm: Normal rate.  Pulmonary:     Effort: Pulmonary effort is normal.  Musculoskeletal:     Left shoulder: Tenderness (+ Hawkins and Neer test, movement pain at the extremes of range of motion) present. No swelling, deformity, effusion, laceration, bony tenderness or crepitus. Normal range of motion. Normal strength.     Cervical back: Normal range of motion. Spasms (left trapezius) present. No swelling, edema, deformity, erythema, signs of trauma, lacerations, rigidity, torticollis,  tenderness, bony tenderness or crepitus. No pain with movement. Normal range of motion.  Neurological:     Mental Status: He is alert and oriented to person, place, and time.  Psychiatric:        Mood and Affect: Mood normal.        Behavior: Behavior normal.        Thought Content: Thought content normal.        Judgment: Judgment normal.    Assessment and Plan :   PDMP not reviewed this encounter.  1. Acute pain of left shoulder   2. Trapezius muscle spasm     Deferred imaging as physical exam findings do not support use of this.  Recommended conservative management with naproxen, tizanidine.  Also discussed shoulder rehab.  Follow-up with orthopedist if symptoms persist. Counseled patient on potential for adverse effects with medications prescribed/recommended today, ER and return-to-clinic precautions discussed, patient verbalized understanding.    Wallis Bamberg, [place] 02/17/21 [phone]

## 2021-02-17 NOTE — ED Triage Notes (Signed)
Lt shoulder pain started yesterday. Pt reports he has had the same pain in past.

## 2021-06-21 ENCOUNTER — Other Ambulatory Visit (HOSPITAL_COMMUNITY): Payer: Self-pay

## 2022-02-20 DIAGNOSIS — R6889 Other general symptoms and signs: Secondary | ICD-10-CM | POA: Diagnosis not present

## 2022-02-20 DIAGNOSIS — U071 COVID-19: Secondary | ICD-10-CM | POA: Diagnosis not present

## 2022-03-01 ENCOUNTER — Encounter (HOSPITAL_BASED_OUTPATIENT_CLINIC_OR_DEPARTMENT_OTHER): Payer: Self-pay

## 2022-03-01 ENCOUNTER — Encounter (HOSPITAL_COMMUNITY): Payer: Self-pay

## 2022-03-01 ENCOUNTER — Emergency Department (HOSPITAL_BASED_OUTPATIENT_CLINIC_OR_DEPARTMENT_OTHER): Payer: 59

## 2022-03-01 ENCOUNTER — Inpatient Hospital Stay (HOSPITAL_BASED_OUTPATIENT_CLINIC_OR_DEPARTMENT_OTHER)
Admission: EM | Admit: 2022-03-01 | Discharge: 2022-03-05 | DRG: 065 | Disposition: A | Discharge status: Rehabilitation Facility | Payer: 59 | Attending: Internal Medicine | Admitting: Internal Medicine

## 2022-03-01 ENCOUNTER — Other Ambulatory Visit: Payer: Self-pay

## 2022-03-01 DIAGNOSIS — G8194 Hemiplegia, unspecified affecting left nondominant side: Secondary | ICD-10-CM | POA: Diagnosis present

## 2022-03-01 DIAGNOSIS — T466X6A Underdosing of antihyperlipidemic and antiarteriosclerotic drugs, initial encounter: Secondary | ICD-10-CM | POA: Diagnosis present

## 2022-03-01 DIAGNOSIS — R4701 Aphasia: Secondary | ICD-10-CM | POA: Diagnosis not present

## 2022-03-01 DIAGNOSIS — Z8616 Personal history of COVID-19: Secondary | ICD-10-CM

## 2022-03-01 DIAGNOSIS — I10 Essential (primary) hypertension: Secondary | ICD-10-CM | POA: Diagnosis not present

## 2022-03-01 DIAGNOSIS — E785 Hyperlipidemia, unspecified: Secondary | ICD-10-CM | POA: Diagnosis present

## 2022-03-01 DIAGNOSIS — I6381 Other cerebral infarction due to occlusion or stenosis of small artery: Principal | ICD-10-CM | POA: Diagnosis present

## 2022-03-01 DIAGNOSIS — Z91128 Patient's intentional underdosing of medication regimen for other reason: Secondary | ICD-10-CM | POA: Diagnosis not present

## 2022-03-01 DIAGNOSIS — T39316A Underdosing of propionic acid derivatives, initial encounter: Secondary | ICD-10-CM | POA: Diagnosis present

## 2022-03-01 DIAGNOSIS — E876 Hypokalemia: Secondary | ICD-10-CM | POA: Diagnosis not present

## 2022-03-01 DIAGNOSIS — R29818 Other symptoms and signs involving the nervous system: Secondary | ICD-10-CM | POA: Diagnosis not present

## 2022-03-01 DIAGNOSIS — I6350 Cerebral infarction due to unspecified occlusion or stenosis of unspecified cerebral artery: Secondary | ICD-10-CM | POA: Diagnosis not present

## 2022-03-01 DIAGNOSIS — T502X6A Underdosing of carbonic-anhydrase inhibitors, benzothiadiazides and other diuretics, initial encounter: Secondary | ICD-10-CM | POA: Diagnosis present

## 2022-03-01 DIAGNOSIS — R29810 Facial weakness: Secondary | ICD-10-CM | POA: Diagnosis present

## 2022-03-01 DIAGNOSIS — Z6841 Body Mass Index (BMI) 40.0 and over, adult: Secondary | ICD-10-CM

## 2022-03-01 DIAGNOSIS — R531 Weakness: Secondary | ICD-10-CM | POA: Diagnosis not present

## 2022-03-01 DIAGNOSIS — R29898 Other symptoms and signs involving the musculoskeletal system: Secondary | ICD-10-CM | POA: Diagnosis not present

## 2022-03-01 DIAGNOSIS — I639 Cerebral infarction, unspecified: Secondary | ICD-10-CM

## 2022-03-01 DIAGNOSIS — T465X6A Underdosing of other antihypertensive drugs, initial encounter: Secondary | ICD-10-CM | POA: Diagnosis present

## 2022-03-01 DIAGNOSIS — F909 Attention-deficit hyperactivity disorder, unspecified type: Secondary | ICD-10-CM | POA: Diagnosis present

## 2022-03-01 DIAGNOSIS — U099 Post covid-19 condition, unspecified: Secondary | ICD-10-CM | POA: Diagnosis not present

## 2022-03-01 DIAGNOSIS — E782 Mixed hyperlipidemia: Secondary | ICD-10-CM | POA: Diagnosis not present

## 2022-03-01 DIAGNOSIS — T428X6A Underdosing of antiparkinsonism drugs and other central muscle-tone depressants, initial encounter: Secondary | ICD-10-CM | POA: Diagnosis present

## 2022-03-01 DIAGNOSIS — R29700 NIHSS score 0: Secondary | ICD-10-CM | POA: Diagnosis present

## 2022-03-01 DIAGNOSIS — I6389 Other cerebral infarction: Secondary | ICD-10-CM | POA: Diagnosis not present

## 2022-03-01 DIAGNOSIS — R2990 Unspecified symptoms and signs involving the nervous system: Secondary | ICD-10-CM | POA: Diagnosis present

## 2022-03-01 DIAGNOSIS — Z79899 Other long term (current) drug therapy: Secondary | ICD-10-CM

## 2022-03-01 LAB — DIFFERENTIAL
Abs Immature Granulocytes: 0.07 10*3/uL (ref 0.00–0.07)
Basophils Absolute: 0.1 10*3/uL (ref 0.0–0.1)
Basophils Relative: 1 %
Eosinophils Absolute: 0.3 10*3/uL (ref 0.0–0.5)
Eosinophils Relative: 4 %
Immature Granulocytes: 1 %
Lymphocytes Relative: 25 %
Lymphs Abs: 2.3 10*3/uL (ref 0.7–4.0)
Monocytes Absolute: 0.9 10*3/uL (ref 0.1–1.0)
Monocytes Relative: 10 %
Neutro Abs: 5.6 10*3/uL (ref 1.7–7.7)
Neutrophils Relative %: 59 %

## 2022-03-01 LAB — COMPREHENSIVE METABOLIC PANEL
ALT: 23 U/L (ref 0–44)
AST: 18 U/L (ref 15–41)
Albumin: 3.9 g/dL (ref 3.5–5.0)
Alkaline Phosphatase: 52 U/L (ref 38–126)
Anion gap: 7 (ref 5–15)
BUN: 16 mg/dL (ref 6–20)
CO2: 27 mmol/L (ref 22–32)
Calcium: 8.4 mg/dL — ABNORMAL LOW (ref 8.9–10.3)
Chloride: 106 mmol/L (ref 98–111)
Creatinine, Ser: 0.98 mg/dL (ref 0.61–1.24)
GFR, Estimated: >60 mL/min (ref >60)
Glucose, Bld: 113 mg/dL — ABNORMAL HIGH (ref 70–99)
Potassium: 3.3 mmol/L — ABNORMAL LOW (ref 3.5–5.1)
Sodium: 140 mmol/L (ref 135–145)
Total Bilirubin: 0.6 mg/dL (ref 0.3–1.2)
Total Protein: 7 g/dL (ref 6.5–8.1)

## 2022-03-01 LAB — PROTIME-INR
INR: 1 (ref 0.8–1.2)
Prothrombin Time: 13.5 seconds (ref 11.4–15.2)

## 2022-03-01 LAB — CBC
HCT: 42.1 % (ref 39.0–52.0)
Hemoglobin: 14.6 g/dL (ref 13.0–17.0)
MCH: 28.8 pg (ref 26.0–34.0)
MCHC: 34.7 g/dL (ref 30.0–36.0)
MCV: 83 fL (ref 80.0–100.0)
Platelets: 297 10*3/uL (ref 150–400)
RBC: 5.07 MIL/uL (ref 4.22–5.81)
RDW: 13.2 % (ref 11.5–15.5)
WBC: 9.2 10*3/uL (ref 4.0–10.5)
nRBC: 0 % (ref 0.0–0.2)

## 2022-03-01 LAB — ETHANOL: Alcohol, Ethyl (B): <10 mg/dL (ref <10)

## 2022-03-01 MED ORDER — CLOPIDOGREL BISULFATE 300 MG PO TABS
300.0000 mg | ORAL_TABLET | Freq: Once | ORAL | Status: AC
  Administered 2022-03-01: 300 mg via ORAL
  Filled 2022-03-01: qty 1

## 2022-03-01 MED ORDER — SODIUM CHLORIDE 0.9 % IV BOLUS
500.0000 mL | Freq: Once | INTRAVENOUS | Status: AC
  Administered 2022-03-01: 500 mL via INTRAVENOUS

## 2022-03-01 MED ORDER — SODIUM CHLORIDE 0.9 % IV SOLN
100.0000 mL/h | INTRAVENOUS | Status: DC
  Administered 2022-03-01 – 2022-03-02 (×2): 100 mL/h via INTRAVENOUS

## 2022-03-01 MED ORDER — ASPIRIN 81 MG PO TBEC
81.0000 mg | DELAYED_RELEASE_TABLET | Freq: Once | ORAL | Status: AC
  Administered 2022-03-01: 81 mg via ORAL
  Filled 2022-03-01: qty 1

## 2022-03-01 MED ORDER — IOHEXOL 350 MG/ML SOLN
75.0000 mL | Freq: Once | INTRAVENOUS | Status: AC | PRN
  Administered 2022-03-01: 75 mL via INTRAVENOUS

## 2022-03-01 NOTE — Progress Notes (Signed)
  [facility] will assume care on arrival to accepting facility. Until arrival, care as per EDP. However, [facility] available 24/7 for questions and assistance.   Nursing staff please page [facility] Admits and Consults ([phone]) as soon as the patient arrives to the hospital.  Catherine Oak, DO [facility]  

## 2022-03-01 NOTE — ED Triage Notes (Signed)
LKW noon today. Pt reports heaviness in left arm and leg, spouse reports patient had expressive aphasia earlier that has since resolved. Pt A&Ox4. (+) Covid 11 days ago. Pt reports having issues with balance since noon today at well.

## 2022-03-01 NOTE — Consult Note (Signed)
NEUROLOGY ADDENDUM;  CTA head/neck showed no LVO or significant stenosis.  No change in recommendations.     Sherri Rad, M.D.  Neurology

## 2022-03-01 NOTE — Consult Note (Signed)
[facility] Tele[facility]urology Consult Services  Stat Consult  Patient Name:   Enos, Muhl Date of Birth:   12-Aug-1970 Identification Number:   MRN - [id number] Date of Service:   03/01/2022 21:04:23  Diagnosis:       R44.8 - Other and unspecified symptoms and signs involving general sensations and perceptions  Impression PT is a 51 yo M with HTN, HLP who presented to the ED with left sided weakness and numbness and word-finding difficulty. CT head was unremarkable. PT was outside the window for iv-thrombolytics. CTA head/neck ordered, will follow-up with results. If no stroke, recommend admission with MRI brain without contrast, TTE, lipid panel hemoglobin AIC. Recommend starting dual antiplatelet therapy with Plavix 300 mg x 1 and ASA 81 mg daily x 1 then ASA 81 mg daily and Plavix 75 mg daily starting tomorrow.   Recommendations: Our recommendations are outlined below.  Diagnostic Studies : MRI head without contrast Please order  Laboratory Studies : Lipid panel I ordered Hemoglobin A1c  Antithrombotic Medication : Bolus with Clopidogrel 300 mg bolus x1 and initiate dual antiplatelet therapy with Aspirin 81 mg daily and Clopidogrel 75 mg daily Please order  Anticoagulant Medication : Not indicated  Nursing Recommendations : IV Fluids, avoid dextrose containing fluids, Maintain euglycemiaNeuro checks q4 hrs x 24 hrs and then per shiftHead of bed 30 degreesContinue with Telemetry  Consultations : Recommend Speech therapy if failed dysphagia screenPhysical therapy/Occupational therapy  DVT Prophylaxis : Lovenox or LMW Heparin  Disposition : Neurology will follow   ----------------------------------------------------------------------------------------------------   Advanced Imaging: Advanced imaging has been ordered. Results pending.    Metrics: [facility] Notification Time: 03/01/2022 21:02:53 Stamp Time: 03/01/2022 21:04:23 Callback  Response Time: 03/01/2022 21:05:05  Primary Provider Notified of Diagnostic Impression and Management Plan on: 03/01/2022 21:39:26   CT HEAD: As Per Radiologist CT Head Showed No Acute Hemorrhage or Acute Core Infarct Reviewed    ----------------------------------------------------------------------------------------------------  Chief Complaint: Left sided numbness.  History of Present Illness: Patient is a 51 year old Male. PT is a 51 yo M with HTN, HLP who presented to the ED with left sided weakness and numbness PT woke up this morning with left arm numbness. At first he did not think much of it because it came and went, but then since noon, it persisted. He later developed some left leg numbness. His wife reports that around noon, he seemed to have some word-finding difficulty and seemed slightly off balance. PT denies any headache, neck pain, dizziness. HE was diagnosed with COVID recently and took Paxlovid and stopped his BP medications.    Past Medical History:      Hypertension      Hyperlipidemia      There is no history of Diabetes Mellitus      There is no history of Atrial Fibrillation      There is no history of Coronary Artery Disease      There is no history of Stroke Other PMH:  Left sided weakness and numbness.  Medications:  No Anticoagulant use  No Antiplatelet use Reviewed EMR for current medications  Allergies:  NKDA  Social History: Smoking: No  Family History:  There is no family history of premature cerebrovascular disease pertinent to this consultation  ROS : 14 Points Review of Systems was performed and was negative except mentioned in HPI.  Past Surgical History: There Is No Surgical History Contributory To Today's Visit    Examination: BP(192/90), Pulse(71), 1A: Level of Consciousness - Alert; keenly responsive +  0 1B: Ask Month and Age - Both Questions Right + 0 1C: Blink Eyes & Squeeze Hands - Performs Both Tasks + 0 2: Test  Horizontal Extraocular Movements - Normal + 0 3: Test Visual Fields - No Visual Loss + 0 4: Test Facial Palsy (Use Grimace if Obtunded) - Normal symmetry + 0 5A: Test Left Arm Motor Drift - No Drift for 10 Seconds + 0 5B: Test Right Arm Motor Drift - No Drift for 10 Seconds + 0 6A: Test Left Leg Motor Drift - No Drift for 5 Seconds + 0 6B: Test Right Leg Motor Drift - No Drift for 5 Seconds + 0 7: Test Limb Ataxia (FNF/Heel-Shin) - No Ataxia + 0 8: Test Sensation - Normal; No sensory loss + 0 9: Test Language/Aphasia - Normal; No aphasia + 0 10: Test Dysarthria - Normal + 0 11: Test Extinction/Inattention - No abnormality + 0  NIHSS Score: 0  Spoke with : Dr. Jacalyn Lefevre    Patient / Family was informed the Neurology Consult would occur via TeleHealth consult by way of interactive audio and video telecommunications and consented to receiving care in this manner.  Patient is being evaluated for possible acute neurologic impairment and high probability of imminent or life - threatening deterioration.I spent total of 35 minutes providing care to this patient, including time for face to face visit via telemedicine, review of medical records, imaging studies and discussion of findings with providers, the patient and / or family.   Dr Jacky Kindle   [facility] For Inpatient follow-up with [facility] physician please call [facility] [phone]. This is not an outpatient service. Post hospital discharge, please contact hospital directly.

## 2022-03-01 NOTE — ED Provider Notes (Signed)
[facility] EMERGENCY DEPARTMENT Provider Note   CSN: [id number] Arrival date & time: 03/01/22  1951     History  Chief Complaint  Patient presents with   Extremity Weakness    Charles Farley is a 51 y.o. male.  Pt is a 51 yo male with a pmhx significant for HTN and ADHD.  Pt said he had some left arm weakness and numbness when he woke up this am.  Pt said it seemed to come and go until about noon when it seems to have stuck around.  His wife said he had some expressive aphasia around noon, but that has resolved.  Pt now has some heaviness in his left leg.  Pt has been having balance issues as well since noon.  Pt did test + for Covid 11 days ago.  Those sx are gone.  He has just moved and his bp meds are in a box somewhere, so he has not taken his bp meds in a few days.  He later admits that it's been months since he's taken any meds.  He is not on blood thinners.       Home Medications Prior to Admission medications   Medication Sig Start Date End Date Taking? Authorizing Provider  lisdexamfetamine (VYVANSE) 50 MG capsule TAKE 1 CAPSULE BY MOUTH ONCE A DAY 08/17/20 02/13/21    lisdexamfetamine (VYVANSE) 50 MG capsule TAKE 1 CAPSULE BY MOUTH EVERY MORNING FOR ADD 06/01/20 11/28/20  Jeronimo Norma, NP  lisdexamfetamine (VYVANSE) 50 MG capsule TAKE 1 CAPSULE BY MOUTH EVERY MORNING FOR ADD 05/01/20 10/28/20  Jeronimo Norma, NP  lisdexamfetamine (VYVANSE) 50 MG capsule TAKE 1 CAPSULE BY MOUTH EVERY MORNING FOR ADD 03/28/20 09/24/20    lisdexamfetamine (VYVANSE) 50 MG capsule TAKE 1 CAPSULE BY MOUTH IN THE MORNING FOR ADD FOR 30 DAYS. 03/03/20 08/30/20  Jeronimo Norma, NP  naproxen (NAPROSYN) 375 MG tablet Take 1 tablet (375 mg total) by mouth 2 (two) times daily with a meal. 02/17/21   Jaynee Eagles, PA-C  rosuvastatin (CRESTOR) 20 MG tablet TAKE 1 TABLET BY MOUTH ONCE A DAY 03/15/20 03/15/21  Jeronimo Norma, NP  rosuvastatin (CRESTOR) 40 MG tablet TAKE 1 TABLET BY MOUTH DAILY FOR  CHOLESTEROL 01/29/21     tiZANidine (ZANAFLEX) 4 MG tablet Take 1 tablet (4 mg total) by mouth at bedtime. 02/17/21   Jaynee Eagles, PA-C  valsartan (DIOVAN) 160 MG tablet TAKE 1 TABLET BY MOUTH ONCE A DAY 03/15/20 03/15/21  Jeronimo Norma, NP  valsartan (DIOVAN) 320 MG tablet TAKE ONE TABLET BY MOUTH DAILY FOR BLOOD PRESSURE. 01/29/21     VYVANSE 50 MG capsule Take 50 mg by mouth every morning. 01/31/20   [provider]      Allergies    Patient has no known allergies.    Review of Systems   Review of Systems  Neurological:  Positive for weakness and numbness.  All other systems reviewed and are negative.   Physical Exam Updated Vital Signs BP (!) 172/79   Pulse 80   Temp 98.5 F (36.9 C) (Oral)   Resp 19   Ht 6' (1.829 m)   Wt (!) 142.9 kg   SpO2 99%   BMI 42.72 kg/m  Physical Exam Vitals and nursing note reviewed.  Constitutional:      Appearance: Normal appearance.  HENT:     Head: Normocephalic and atraumatic.     Right Ear: External ear normal.     Left  Ear: External ear normal.     Nose: Nose normal.     Mouth/Throat:     Mouth: Mucous membranes are moist.     Pharynx: Oropharynx is clear.  Eyes:     Extraocular Movements: Extraocular movements intact.     Conjunctiva/sclera: Conjunctivae normal.     Pupils: Pupils are equal, round, and reactive to light.  Cardiovascular:     Rate and Rhythm: Normal rate and regular rhythm.     Pulses: Normal pulses.     Heart sounds: Normal heart sounds.  Pulmonary:     Effort: Pulmonary effort is normal.     Breath sounds: Normal breath sounds.  Abdominal:     General: Abdomen is flat. Bowel sounds are normal.     Palpations: Abdomen is soft.  Musculoskeletal:        General: Normal range of motion.     Cervical back: Normal range of motion and neck supple.  Skin:    General: Skin is warm.     Capillary Refill: Capillary refill takes less than 2 seconds.  Neurological:     General: No focal deficit present.      Mental Status: He is alert and oriented to person, place, and time.     Comments: Left arm and leg "feel heavy" to patient.  He has good motor strength.  Psychiatric:        Attention and Perception: Attention and perception normal.        Mood and Affect: Mood normal.        Speech: Speech normal.        Behavior: Behavior normal.        Thought Content: Thought content normal.     ED Results / Procedures / Treatments   Labs (all labs ordered are listed, but only abnormal results are displayed) Labs Reviewed  COMPREHENSIVE METABOLIC PANEL - Abnormal; Notable for the following components:      Result Value   Potassium 3.3 (*)    Glucose, Bld 113 (*)    Calcium 8.4 (*)    All other components within normal limits  ETHANOL  PROTIME-INR  CBC  DIFFERENTIAL  RAPID URINE DRUG SCREEN, HOSP PERFORMED  URINALYSIS, ROUTINE W REFLEX MICROSCOPIC  LIPID PANEL    EKG EKG Interpretation  Date/Time:  Saturday March 01 2022 20:06:34 EDT Ventricular Rate:  87 PR Interval:  140 QRS Duration: 102 QT Interval:  374 QTC Calculation: 450 R Axis:   63 Text Interpretation: Sinus rhythm Minimal ST depression, inferior leads No old tracing to compare Confirmed by Isla Pence [phone]) on 03/01/2022 8:36:00 PM  Radiology CT ANGIO HEAD NECK W WO CM  Result Date: 03/01/2022 : Ordering Physician: Sherri Rad, MD. At time of dictation, Ulyses Jarred is erroneously listed in the exam header as the ordering physician. The ordering provider information is correct in Epic. CLINICAL DATA:  Left-sided weakness with expressive aphasia EXAM: CT ANGIOGRAPHY HEAD AND NECK TECHNIQUE: Multidetector CT imaging of the head and neck was performed using the standard protocol during bolus administration of intravenous contrast. Multiplanar CT image reconstructions and MIPs were obtained to evaluate the vascular anatomy. Carotid stenosis measurements (when applicable) are obtained utilizing NASCET criteria,  using the distal internal carotid diameter as the denominator. RADIATION DOSE REDUCTION: This exam was performed according to the departmental dose-optimization program which includes automated exposure control, adjustment of the mA and/or kV according to patient size and/or use of iterative reconstruction technique. CONTRAST:  15mL OMNIPAQUE IOHEXOL 350  MG/ML SOLN COMPARISON:  None Available. FINDINGS: CTA NECK FINDINGS SKELETON: There is no bony spinal canal stenosis. No lytic or blastic lesion. OTHER NECK: Normal pharynx, larynx and major salivary glands. No cervical lymphadenopathy. Unremarkable thyroid gland. UPPER CHEST: No pneumothorax or pleural effusion. No nodules or masses. AORTIC ARCH: There is no calcific atherosclerosis of the aortic arch. There is no aneurysm, dissection or hemodynamically significant stenosis of the visualized portion of the aorta. Conventional 3 vessel aortic branching pattern. The visualized proximal subclavian arteries are widely patent. RIGHT CAROTID SYSTEM: Normal without aneurysm, dissection or stenosis. LEFT CAROTID SYSTEM: Normal without aneurysm, dissection or stenosis. VERTEBRAL ARTERIES: Right dominant configuration. Both origins are clearly patent. There is no dissection, occlusion or flow-limiting stenosis to the skull base (V1-V3 segments). CTA HEAD FINDINGS POSTERIOR CIRCULATION: --Vertebral arteries: Normal V4 segments. --Inferior cerebellar arteries: Normal. --Basilar artery: Normal. --Superior cerebellar arteries: Normal. --Posterior cerebral arteries (PCA): Normal. ANTERIOR CIRCULATION: --Intracranial internal carotid arteries: Normal. --Anterior cerebral arteries (ACA): Normal. Both A1 segments are present. Patent anterior communicating artery (a-comm). --Middle cerebral arteries (MCA): Normal. VENOUS SINUSES: As permitted by contrast timing, patent. ANATOMIC VARIANTS: None Review of the MIP images confirms the above findings. IMPRESSION: Normal CTA of the head  and neck Electronically Signed   By: Ulyses Jarred M.D.   On: 03/01/2022 23:04   CT HEAD WO CONTRAST  Result Date: 03/01/2022 CLINICAL DATA:  Neuro deficit, acute stroke suspected. Last known well noon today. Heaviness in the left arm and leg. EXAM: CT HEAD WITHOUT CONTRAST TECHNIQUE: Contiguous axial images were obtained from the base of the skull through the vertex without intravenous contrast. RADIATION DOSE REDUCTION: This exam was performed according to the departmental dose-optimization program which includes automated exposure control, adjustment of the mA and/or kV according to patient size and/or use of iterative reconstruction technique. COMPARISON:  None Available. FINDINGS: Brain: No evidence of acute infarction, hemorrhage, hydrocephalus, extra-axial collection or mass lesion/mass effect. Vascular: No hyperdense vessel or unexpected calcification. Skull: Normal. Negative for fracture or focal lesion. Sinuses/Orbits: No acute finding. Other: None. IMPRESSION: No acute intracranial pathology. Aspects score of 10. Electronically Signed   By: Keane Police D.O.   On: 03/01/2022 20:35    Procedures Procedures    Medications Ordered in ED Medications  sodium chloride 0.9 % bolus 500 mL (0 mLs Intravenous Stopped 03/01/22 2137)    Followed by  0.9 %  sodium chloride infusion (100 mL/hr Intravenous New Bag/Given 03/01/22 2143)  iohexol (OMNIPAQUE) 350 MG/ML injection 75 mL (75 mLs Intravenous Contrast Given 03/01/22 2212)  clopidogrel (PLAVIX) tablet 300 mg (300 mg Oral Given 03/01/22 2225)  aspirin EC tablet 81 mg (81 mg Oral Given 03/01/22 2225)    ED Course/ Medical Decision Making/ A&P                           Medical Decision Making Amount and/or Complexity of Data Reviewed Labs: ordered. Radiology: ordered.  Risk OTC drugs. Prescription drug management. Decision regarding hospitalization.   This patient presents to the ED for concern of left arm/leg numbness, this  involves an extensive number of treatment options, and is a complaint that carries with it a high risk of complications and morbidity.  The differential diagnosis includes cva, tia,    Co morbidities that complicate the patient evaluation  HTN and ADHD   Additional history obtained:  Additional history obtained from epic chart review External records from outside source obtained and  reviewed including wife   Lab Tests:  I Ordered, and personally interpreted labs.  The pertinent results include:  cbc nl, cmp nl, inr 1.0; etoh <10;    Imaging Studies ordered:  I ordered imaging studies including ct head/cta head/neck I independently visualized and interpreted imaging which showed  CT head: IMPRESSION:  No acute intracranial pathology. Aspects score of 10.    CTA head/neck: IMPRESSION:  Normal CTA of the head and neck    I agree with the radiologist interpretation   Cardiac Monitoring:  The patient was maintained on a cardiac monitor.  I personally viewed and interpreted the cardiac monitored which showed an underlying rhythm of: nsr   Medicines ordered and prescription drug management:  I ordered medication including asa  for cva  Reevaluation of the patient after these medicines showed that the patient improved I have reviewed the patients home medicines and have made adjustments as needed   Test Considered:  Cta/mri   Critical Interventions:  Teleneurology consult   Consultations Obtained:  I requested consultation with the teleneurologist (Dr. Sherri Rad),  and discussed lab and imaging findings as well as pertinent plan - she recommends admission for stroke work up, cta head/neck and asa and plavix. Pt d/w Dr. Bridgett Larsson ([facility]) who will accept him for admission.  Problem List / ED Course:  Left sided weakness + word finding difficulty:  pt is out of the window for tpa, so code stroke was not called.  Teleneurology consulted.  She recommends a CTA and  admission for MRI and stroke work up.  She recommends giving 300 mg plavix and 81 mg asa.   Reevaluation:  After the interventions noted above, I reevaluated the patient and found that they have :improved   Social Determinants of Health:  Lives at home   Dispostion:  After consideration of the diagnostic results and the patients response to treatment, I feel that the patent would benefit from admission.          Final Clinical Impression(s) / ED Diagnoses Final diagnoses:  Left arm weakness    Rx / DC Orders ED Discharge Orders     None         Isla Pence, MD 03/01/22 2319

## 2022-03-02 ENCOUNTER — Encounter (HOSPITAL_COMMUNITY): Payer: Self-pay | Admitting: Internal Medicine

## 2022-03-02 DIAGNOSIS — I6381 Other cerebral infarction due to occlusion or stenosis of small artery: Secondary | ICD-10-CM | POA: Diagnosis present

## 2022-03-02 DIAGNOSIS — E785 Hyperlipidemia, unspecified: Secondary | ICD-10-CM | POA: Diagnosis not present

## 2022-03-02 DIAGNOSIS — T465X6A Underdosing of other antihypertensive drugs, initial encounter: Secondary | ICD-10-CM | POA: Diagnosis not present

## 2022-03-02 DIAGNOSIS — Z8616 Personal history of COVID-19: Secondary | ICD-10-CM | POA: Diagnosis not present

## 2022-03-02 DIAGNOSIS — R2990 Unspecified symptoms and signs involving the nervous system: Secondary | ICD-10-CM

## 2022-03-02 DIAGNOSIS — Z79899 Other long term (current) drug therapy: Secondary | ICD-10-CM | POA: Diagnosis not present

## 2022-03-02 DIAGNOSIS — I10 Essential (primary) hypertension: Secondary | ICD-10-CM | POA: Diagnosis present

## 2022-03-02 DIAGNOSIS — T466X6A Underdosing of antihyperlipidemic and antiarteriosclerotic drugs, initial encounter: Secondary | ICD-10-CM | POA: Diagnosis not present

## 2022-03-02 DIAGNOSIS — T39316A Underdosing of propionic acid derivatives, initial encounter: Secondary | ICD-10-CM | POA: Diagnosis not present

## 2022-03-02 DIAGNOSIS — R29898 Other symptoms and signs involving the musculoskeletal system: Secondary | ICD-10-CM

## 2022-03-02 DIAGNOSIS — E876 Hypokalemia: Secondary | ICD-10-CM | POA: Diagnosis not present

## 2022-03-02 DIAGNOSIS — R29810 Facial weakness: Secondary | ICD-10-CM | POA: Diagnosis not present

## 2022-03-02 DIAGNOSIS — R29700 NIHSS score 0: Secondary | ICD-10-CM | POA: Diagnosis not present

## 2022-03-02 DIAGNOSIS — F909 Attention-deficit hyperactivity disorder, unspecified type: Secondary | ICD-10-CM | POA: Diagnosis not present

## 2022-03-02 DIAGNOSIS — T428X6A Underdosing of antiparkinsonism drugs and other central muscle-tone depressants, initial encounter: Secondary | ICD-10-CM | POA: Diagnosis not present

## 2022-03-02 DIAGNOSIS — Z6841 Body Mass Index (BMI) 40.0 and over, adult: Secondary | ICD-10-CM | POA: Diagnosis not present

## 2022-03-02 DIAGNOSIS — Z91128 Patient's intentional underdosing of medication regimen for other reason: Secondary | ICD-10-CM | POA: Diagnosis not present

## 2022-03-02 DIAGNOSIS — T502X6A Underdosing of carbonic-anhydrase inhibitors, benzothiadiazides and other diuretics, initial encounter: Secondary | ICD-10-CM | POA: Diagnosis not present

## 2022-03-02 DIAGNOSIS — G8194 Hemiplegia, unspecified affecting left nondominant side: Secondary | ICD-10-CM | POA: Diagnosis not present

## 2022-03-02 DIAGNOSIS — R4701 Aphasia: Secondary | ICD-10-CM | POA: Diagnosis not present

## 2022-03-02 LAB — RAPID URINE DRUG SCREEN, HOSP PERFORMED
Amphetamines: NOT DETECTED
Barbiturates: NOT DETECTED
Benzodiazepines: NOT DETECTED
Cocaine: NOT DETECTED
Opiates: NOT DETECTED
Tetrahydrocannabinol: NOT DETECTED

## 2022-03-02 LAB — LIPID PANEL
Cholesterol: 223 mg/dL — ABNORMAL HIGH (ref 0–200)
HDL: 35 mg/dL — ABNORMAL LOW (ref >40)
LDL Cholesterol: 142 mg/dL — ABNORMAL HIGH (ref 0–99)
Total CHOL/HDL Ratio: 6.4 RATIO
Triglycerides: 232 mg/dL — ABNORMAL HIGH (ref <150)
VLDL: 46 mg/dL — ABNORMAL HIGH (ref 0–40)

## 2022-03-02 LAB — URINALYSIS, ROUTINE W REFLEX MICROSCOPIC
Bilirubin Urine: NEGATIVE
Glucose, UA: NEGATIVE mg/dL
Hgb urine dipstick: NEGATIVE
Ketones, ur: NEGATIVE mg/dL
Leukocytes,Ua: NEGATIVE
Nitrite: NEGATIVE
Protein, ur: NEGATIVE mg/dL
Specific Gravity, Urine: 1.015 (ref 1.005–1.030)
pH: 7 (ref 5.0–8.0)

## 2022-03-02 MED ORDER — ACETAMINOPHEN 325 MG PO TABS: 650.0000 mg | ORAL_TABLET | ORAL | Status: DC | PRN

## 2022-03-02 MED ORDER — HYDROCHLOROTHIAZIDE 12.5 MG PO TABS
12.5000 mg | ORAL_TABLET | Freq: Every day | ORAL | Status: DC
  Administered 2022-03-02: 12.5 mg via ORAL
  Filled 2022-03-02: qty 1

## 2022-03-02 MED ORDER — LORAZEPAM 2 MG/ML IJ SOLN: 1.0000 mg | Freq: Once | INTRAMUSCULAR | Status: DC | PRN

## 2022-03-02 MED ORDER — ENOXAPARIN SODIUM 40 MG/0.4ML IJ SOSY
40.0000 mg | PREFILLED_SYRINGE | INTRAMUSCULAR | Status: DC
  Administered 2022-03-03 – 2022-03-05 (×3): 40 mg via SUBCUTANEOUS
  Filled 2022-03-02 (×3): qty 0.4

## 2022-03-02 MED ORDER — ACETAMINOPHEN 650 MG RE SUPP: 650.0000 mg | RECTAL | Status: DC | PRN

## 2022-03-02 MED ORDER — IRBESARTAN 300 MG PO TABS
300.0000 mg | ORAL_TABLET | Freq: Every day | ORAL | Status: DC
  Administered 2022-03-02: 300 mg via ORAL
  Filled 2022-03-02: qty 2

## 2022-03-02 MED ORDER — ACETAMINOPHEN 160 MG/5ML PO SOLN: 650.0000 mg | ORAL | Status: DC | PRN

## 2022-03-02 MED ORDER — ATORVASTATIN CALCIUM 40 MG PO TABS
40.0000 mg | ORAL_TABLET | Freq: Every day | ORAL | Status: DC
  Administered 2022-03-02 – 2022-03-03 (×2): 40 mg via ORAL
  Filled 2022-03-02 (×2): qty 1

## 2022-03-02 MED ORDER — STROKE: EARLY STAGES OF RECOVERY BOOK: Freq: Once | Status: DC

## 2022-03-02 MED ORDER — POTASSIUM CHLORIDE CRYS ER 20 MEQ PO TBCR
40.0000 meq | EXTENDED_RELEASE_TABLET | Freq: Once | ORAL | Status: AC
  Administered 2022-03-02: 40 meq via ORAL
  Filled 2022-03-02: qty 2

## 2022-03-02 MED ORDER — ASPIRIN 81 MG PO TBEC
81.0000 mg | DELAYED_RELEASE_TABLET | Freq: Every day | ORAL | Status: DC
  Administered 2022-03-02 – 2022-03-05 (×4): 81 mg via ORAL
  Filled 2022-03-02 (×4): qty 1

## 2022-03-02 NOTE — Progress Notes (Signed)
Patient received from [facility] to room 31. Patient alert and oriented x 4, ambulates from stretcher to bed with standby assistance. Telemetry applied to patient and verified.

## 2022-03-02 NOTE — ED Provider Notes (Signed)
Charge nurse at [facility] is agreed to EDD ED transfer for this patient that was admitted by hospitalist service and also consulted on by neurology admission was for left-sided weakness possible CVA.  Discussed with Dr. Gilford Raid that she saw the patient here yesterday she is currently at [facility] she will accept the patient in transfer from [place] to the emergency department at [facility].  Patient will need to go by [facility].   Fredia Sorrow, MD 03/02/22 1728

## 2022-03-02 NOTE — Assessment & Plan Note (Signed)
Hasnt taken BP meds in months. Hold BP meds in setting of suspected acute stroke.

## 2022-03-02 NOTE — Plan of Care (Signed)
  Problem: Education: Goal: Knowledge of General Education information will improve Description: Including pain rating scale, medication(s)/side effects and non-pharmacologic comfort measures Outcome: Progressing   Problem: Health Behavior/Discharge Planning: Goal: Ability to manage health-related needs will improve Outcome: Progressing   Problem: Clinical Measurements: Goal: Ability to maintain clinical measurements within normal limits will improve Outcome: Progressing Goal: Will remain free from infection Outcome: Progressing Goal: Diagnostic test results will improve Outcome: Progressing Goal: Respiratory complications will improve Outcome: Progressing Goal: Cardiovascular complication will be avoided Outcome: Progressing   Problem: Activity: Goal: Risk for activity intolerance will decrease Outcome: Progressing   Problem: Nutrition: Goal: Adequate nutrition will be maintained Outcome: Progressing   Problem: Coping: Goal: Level of anxiety will decrease Outcome: Progressing   Problem: Elimination: Goal: Will not experience complications related to bowel motility Outcome: Progressing Goal: Will not experience complications related to urinary retention Outcome: Progressing   Problem: Pain Managment: Goal: General experience of comfort will improve Outcome: Progressing   Problem: Safety: Goal: Ability to remain free from injury will improve Outcome: Progressing   Problem: Skin Integrity: Goal: Risk for impaired skin integrity will decrease Outcome: Progressing   Problem: Education: Goal: Knowledge of disease or condition will improve Outcome: Progressing Goal: Knowledge of secondary prevention will improve (SELECT ALL) Outcome: Progressing Goal: Knowledge of patient specific risk factors will improve (INDIVIDUALIZE FOR PATIENT) Outcome: Progressing Goal: Individualized Educational Video(s) Outcome: Progressing   Problem: Coping: Goal: Will verbalize  positive feelings about self Outcome: Progressing   Problem: Health Behavior/Discharge Planning: Goal: Ability to manage health-related needs will improve Outcome: Progressing   Problem: Self-Care: Goal: Ability to participate in self-care as condition permits will improve Outcome: Progressing   Problem: Nutrition: Goal: Risk of aspiration will decrease Outcome: Progressing

## 2022-03-02 NOTE — H&P (Signed)
History and Physical    Patient: Charles Farley UXL:[id number] DOB: 1970/09/28 DOA: 03/01/2022 DOS: the patient was seen and examined on 03/02/2022 PCP: Lance Bosch, NP  Patient coming from: Home  Chief Complaint:  Chief Complaint  Patient presents with   Extremity Weakness   HPI: Charles Farley is a 51 y.o. male with medical history significant of HTN, obesity.  Pt presents to ED with c/o L arm weakness onset when he woke up in AM 10/14.  Seemed to come and go until noon then became constant.   His wife said he had some expressive aphasia around noon, but that has resolved.  Pt in to ED.  Having heaviness of L leg.  Pt did test + for Covid 11 days ago.  Those sx are gone.  Admits to not taking any meds for several months.  Pt accepted for transfer last evening, unfortunately transfer delayed due to lack of beds at [facility] until this evening.  Review of Systems: As mentioned in the history of present illness. All other systems reviewed and are negative. Past Medical History:  Diagnosis Date   ADHD    Hypertension    No past surgical history on file. Social History:  reports that he has never smoked. He has never used smokeless tobacco. He reports that he does not currently use alcohol. He reports that he does not use drugs.  No Known Allergies  No family history on file.  Prior to Admission medications   Medication Sig Start Date End Date Taking? Authorizing Provider  hydrochlorothiazide (HYDRODIURIL) 12.5 MG tablet Take 12.5 mg by mouth daily.   Yes [provider]  rosuvastatin (CRESTOR) 40 MG tablet TAKE 1 TABLET BY MOUTH DAILY FOR CHOLESTEROL 01/29/21  Yes   valsartan (DIOVAN) 320 MG tablet TAKE ONE TABLET BY MOUTH DAILY FOR BLOOD PRESSURE. 01/29/21  Yes   lisdexamfetamine (VYVANSE) 50 MG capsule TAKE 1 CAPSULE BY MOUTH ONCE A DAY 08/17/20 02/13/21    lisdexamfetamine (VYVANSE) 50 MG capsule TAKE 1 CAPSULE BY MOUTH EVERY MORNING FOR ADD 06/01/20 11/28/20  Dewaine Conger, NP  lisdexamfetamine (VYVANSE) 50 MG capsule TAKE 1 CAPSULE BY MOUTH EVERY MORNING FOR ADD 05/01/20 10/28/20  Dewaine Conger, NP  lisdexamfetamine (VYVANSE) 50 MG capsule TAKE 1 CAPSULE BY MOUTH EVERY MORNING FOR ADD 03/28/20 09/24/20    lisdexamfetamine (VYVANSE) 50 MG capsule TAKE 1 CAPSULE BY MOUTH IN THE MORNING FOR ADD FOR 30 DAYS. 03/03/20 08/30/20  Dewaine Conger, NP  naproxen (NAPROSYN) 375 MG tablet Take 1 tablet (375 mg total) by mouth 2 (two) times daily with a meal. 02/17/21   Wallis Bamberg, PA-C  rosuvastatin (CRESTOR) 20 MG tablet TAKE 1 TABLET BY MOUTH ONCE A DAY 03/15/20 03/15/21  Dewaine Conger, NP  tiZANidine (ZANAFLEX) 4 MG tablet Take 1 tablet (4 mg total) by mouth at bedtime. 02/17/21   Wallis Bamberg, PA-C  valsartan (DIOVAN) 160 MG tablet TAKE 1 TABLET BY MOUTH ONCE A DAY 03/15/20 03/15/21  Dewaine Conger, NP  VYVANSE 50 MG capsule Take 50 mg by mouth every morning. 01/31/20   [provider]    Physical Exam: Vitals:   03/02/22 1607 03/02/22 1803 03/02/22 1852 03/02/22 1940  BP:  (!) 183/77 (!) 168/90 130/79  Pulse:  82 76 73  Resp:  20 19 (!) 22  Temp: 98.4 F (36.9 C) 98.5 F (36.9 C) 98.3 F (36.8 C) 99.2 F (37.3 C)  TempSrc: Oral Oral Oral Axillary  SpO2:  99% 100% 94%  Weight:      Height:       Constitutional: NAD, calm, comfortable Eyes: PERRL, lids and conjunctivae normal ENMT: Mucous membranes are moist. Posterior pharynx clear of any exudate or lesions.Normal dentition.  Neck: normal, supple, no masses, no thyromegaly Respiratory: clear to auscultation bilaterally, no wheezing, no crackles. Normal respiratory effort. No accessory muscle use.  Cardiovascular: Regular rate and rhythm, no murmurs / rubs / gallops. No extremity edema. 2+ pedal pulses. No carotid bruits.  Abdomen: no tenderness, no masses palpated. No hepatosplenomegaly. Bowel sounds positive.  Musculoskeletal: no clubbing / cyanosis. No joint deformity upper and lower  extremities. Good ROM, no contractures. Normal muscle tone.  Skin: no rashes, lesions, ulcers. No induration Neurologic: 4+/5 on LUE, LLE, 5/5 on R. Psychiatric: Normal judgment and insight. Alert and oriented x 3. Normal mood.   Data Reviewed:    CT head neg CTA head and neck: IMPRESSION: Normal CTA of the head and neck   Lipid Panel     Component Value Date/Time   CHOL 223 (H) 03/01/2022 2013   TRIG 232 (H) 03/01/2022 2013   HDL 35 (L) 03/01/2022 2013   CHOLHDL 6.4 03/01/2022 2013   VLDL 46 (H) 03/01/2022 2013   [place] 142 (H) 03/01/2022 2013     Assessment and Plan: * Stroke-like symptoms Stroke pathway MRI brain Consult neurology if positive for stroke Tele monitor 2d echo PT/OT/SLP ASA 81 for the moment, add plavix for 21 days if MRI positive for stroke Starting statin for HLD Check A1C  HLD (hyperlipidemia) Start high intensity statin.  HTN (hypertension) Hasnt taken BP meds in months. Hold BP meds in setting of suspected acute stroke.      Advance Care Planning:   Code Status: Full Code  Consults: None  Family Communication: No family in room  Severity of Illness: The appropriate patient status for this patient is OBSERVATION. Observation status is judged to be reasonable and necessary in order to provide the required intensity of service to ensure the patient's safety. The patient's presenting symptoms, physical exam findings, and initial radiographic and laboratory data in the context of their medical condition is felt to place them at decreased risk for further clinical deterioration. Furthermore, it is anticipated that the patient will be medically stable for discharge from the hospital within 2 midnights of admission.   Author: Etta Quill., DO 03/02/2022 7:49 PM  For on call review [email or web address].

## 2022-03-02 NOTE — Assessment & Plan Note (Signed)
Start high intensity statin.

## 2022-03-02 NOTE — ED Notes (Signed)
Per Reginia Forts, charge at [facility] ED, pt will be going to 3W Rm 8. She states bed is ready, taht she gave up rm so this pt could go directly to a rm instead of having to wait t [place].

## 2022-03-02 NOTE — Assessment & Plan Note (Signed)
1. Stroke pathway 2. MRI brain 1. Consult neurology if positive for stroke 3. Tele monitor 4. 2d echo 5. PT/OT/SLP 6. ASA 81 for the moment, add plavix for 21 days if MRI positive for stroke 7. Starting statin for HLD 8. Check A1C

## 2022-03-03 ENCOUNTER — Observation Stay (HOSPITAL_COMMUNITY): Payer: 59

## 2022-03-03 DIAGNOSIS — F909 Attention-deficit hyperactivity disorder, unspecified type: Secondary | ICD-10-CM | POA: Diagnosis present

## 2022-03-03 DIAGNOSIS — I6381 Other cerebral infarction due to occlusion or stenosis of small artery: Secondary | ICD-10-CM | POA: Diagnosis present

## 2022-03-03 DIAGNOSIS — G8194 Hemiplegia, unspecified affecting left nondominant side: Secondary | ICD-10-CM | POA: Diagnosis present

## 2022-03-03 DIAGNOSIS — U099 Post covid-19 condition, unspecified: Secondary | ICD-10-CM | POA: Diagnosis not present

## 2022-03-03 DIAGNOSIS — I639 Cerebral infarction, unspecified: Secondary | ICD-10-CM | POA: Diagnosis not present

## 2022-03-03 DIAGNOSIS — Z8616 Personal history of COVID-19: Secondary | ICD-10-CM | POA: Diagnosis not present

## 2022-03-03 DIAGNOSIS — T465X6A Underdosing of other antihypertensive drugs, initial encounter: Secondary | ICD-10-CM | POA: Diagnosis present

## 2022-03-03 DIAGNOSIS — T39316A Underdosing of propionic acid derivatives, initial encounter: Secondary | ICD-10-CM | POA: Diagnosis present

## 2022-03-03 DIAGNOSIS — E782 Mixed hyperlipidemia: Secondary | ICD-10-CM

## 2022-03-03 DIAGNOSIS — E785 Hyperlipidemia, unspecified: Secondary | ICD-10-CM | POA: Diagnosis not present

## 2022-03-03 DIAGNOSIS — I10 Essential (primary) hypertension: Secondary | ICD-10-CM | POA: Diagnosis not present

## 2022-03-03 DIAGNOSIS — R4701 Aphasia: Secondary | ICD-10-CM | POA: Diagnosis present

## 2022-03-03 DIAGNOSIS — T428X6A Underdosing of antiparkinsonism drugs and other central muscle-tone depressants, initial encounter: Secondary | ICD-10-CM | POA: Diagnosis present

## 2022-03-03 DIAGNOSIS — E876 Hypokalemia: Secondary | ICD-10-CM | POA: Diagnosis not present

## 2022-03-03 DIAGNOSIS — I6350 Cerebral infarction due to unspecified occlusion or stenosis of unspecified cerebral artery: Secondary | ICD-10-CM | POA: Diagnosis not present

## 2022-03-03 DIAGNOSIS — Z6841 Body Mass Index (BMI) 40.0 and over, adult: Secondary | ICD-10-CM | POA: Diagnosis not present

## 2022-03-03 DIAGNOSIS — T466X6A Underdosing of antihyperlipidemic and antiarteriosclerotic drugs, initial encounter: Secondary | ICD-10-CM | POA: Diagnosis present

## 2022-03-03 DIAGNOSIS — R29700 NIHSS score 0: Secondary | ICD-10-CM | POA: Diagnosis present

## 2022-03-03 DIAGNOSIS — I6389 Other cerebral infarction: Secondary | ICD-10-CM

## 2022-03-03 DIAGNOSIS — Z79899 Other long term (current) drug therapy: Secondary | ICD-10-CM | POA: Diagnosis not present

## 2022-03-03 DIAGNOSIS — T502X6A Underdosing of carbonic-anhydrase inhibitors, benzothiadiazides and other diuretics, initial encounter: Secondary | ICD-10-CM | POA: Diagnosis present

## 2022-03-03 DIAGNOSIS — R29898 Other symptoms and signs involving the musculoskeletal system: Secondary | ICD-10-CM | POA: Diagnosis not present

## 2022-03-03 DIAGNOSIS — R29810 Facial weakness: Secondary | ICD-10-CM | POA: Diagnosis present

## 2022-03-03 DIAGNOSIS — Z91128 Patient's intentional underdosing of medication regimen for other reason: Secondary | ICD-10-CM | POA: Diagnosis not present

## 2022-03-03 LAB — HIV ANTIBODY (ROUTINE TESTING W REFLEX): HIV Screen 4th Generation wRfx: NONREACTIVE

## 2022-03-03 LAB — ECHOCARDIOGRAM COMPLETE
Area-P 1/2: 2.87 cm2
Height: 72 in
S' Lateral: 4 cm
Weight: 5040 oz

## 2022-03-03 LAB — HEMOGLOBIN A1C
Hgb A1c MFr Bld: 5.1 % (ref 4.8–5.6)
Mean Plasma Glucose: 99.67 mg/dL

## 2022-03-03 MED ORDER — ROSUVASTATIN CALCIUM 20 MG PO TABS
40.0000 mg | ORAL_TABLET | Freq: Every day | ORAL | Status: DC
  Administered 2022-03-04 – 2022-03-05 (×2): 40 mg via ORAL
  Filled 2022-03-03 (×2): qty 2

## 2022-03-03 MED ORDER — ROSUVASTATIN CALCIUM 20 MG PO TABS: 40.0000 mg | ORAL_TABLET | Freq: Every day | ORAL | Status: DC

## 2022-03-03 MED ORDER — VALSARTAN-HYDROCHLOROTHIAZIDE 320-12.5 MG PO TABS: 1.0000 | ORAL_TABLET | Freq: Every day | ORAL | Status: DC

## 2022-03-03 MED ORDER — PERFLUTREN LIPID MICROSPHERE
1.0000 mL | INTRAVENOUS | Status: AC | PRN
  Administered 2022-03-03: 8 mL via INTRAVENOUS

## 2022-03-03 MED ORDER — HYDROCHLOROTHIAZIDE 12.5 MG PO TABS
12.5000 mg | ORAL_TABLET | Freq: Every day | ORAL | Status: DC
  Administered 2022-03-03 – 2022-03-04 (×2): 12.5 mg via ORAL
  Filled 2022-03-03 (×2): qty 1

## 2022-03-03 MED ORDER — IRBESARTAN 300 MG PO TABS
300.0000 mg | ORAL_TABLET | Freq: Every day | ORAL | Status: DC
  Administered 2022-03-03 – 2022-03-04 (×2): 300 mg via ORAL
  Filled 2022-03-03 (×2): qty 1

## 2022-03-03 MED ORDER — CLOPIDOGREL BISULFATE 75 MG PO TABS
75.0000 mg | ORAL_TABLET | Freq: Every day | ORAL | Status: DC
  Administered 2022-03-03 – 2022-03-05 (×3): 75 mg via ORAL
  Filled 2022-03-03 (×3): qty 1

## 2022-03-03 MED ORDER — STROKE: EARLY STAGES OF RECOVERY BOOK
Status: AC
  Filled 2022-03-03: qty 1

## 2022-03-03 NOTE — Evaluation (Signed)
Speech Language Pathology Evaluation Patient Details Name: Charles Farley MRN: [id number] DOB: 1970-12-08 Today's Date: 03/03/2022 Time: 1443-1540 SLP Time Calculation (min) (ACUTE ONLY): 18 min  Problem List:  Patient Active Problem List   Diagnosis Date Noted   Acute CVA (cerebrovascular accident) ([place]) 03/03/2022   HTN (hypertension) 03/02/2022   HLD (hyperlipidemia) 03/02/2022   Stroke-like symptoms 03/01/2022   Persistent adjustment disorder 02/12/2020   ADHD (attention deficit hyperactivity disorder) 02/12/2020   Passive suicidal ideations 02/12/2020   Past Medical History:  Past Medical History:  Diagnosis Date   ADHD    Hypertension    Past Surgical History: No past surgical history on file. HPI:  Pt is a 51 y.o. male who presented to ED with c/o L UE/LE weakness and heaviness sensation with some expressive aphasia. Stroke work up underway.PMH: HTN, obesity, had COVID 11 days ago   Assessment / Plan / Recommendation Clinical Impression   Pt seen for cognitive-linguistic evaluation s/p stroke. Wife and pt state that he experienced a brief period of expressive aphasia on 03/01/2022, but he has not experienced issues in language production since. Pt easily followed commands examining oral motor function; oral motor function appeared grossly WFL. Pt's speech is intelligible through the conversational level and no vocal abnormalities were noted. [facility] Mental Status evaluation was administered to further evaluate cognitive linguistic function. He scored a 27/30 indicating the absence of cognitive linguistic deficits. The pt accurately answered all verbal comprehension, problem solving, naming and orientation questions. He made errors in recall of previously heard objects, naming 2/5 independently, however, provided semantic cues he accurately recalled 5/5. No SLP treatment required as pt is at or near baseline function.     SLP Assessment  SLP  Recommendation/Assessment: Patient does not need any further [facility] SLP Visit Diagnosis: Cognitive communication deficit (R41.841)    Recommendations for follow up therapy are one component of a multi-disciplinary discharge planning process, led by the attending physician.  Recommendations may be updated based on patient status, additional functional criteria and insurance authorization.    Follow Up Recommendations  No SLP follow up    Assistance Recommended at Discharge  None  Functional Status Assessment Patient has not had a recent decline in their functional status  Frequency and Duration           SLP Evaluation Cognition  Overall Cognitive Status: Within Functional Limits for tasks assessed Arousal/Alertness: Awake/alert Orientation Level: Oriented to person;Oriented to time Year: 2023 Day of Week: Correct Attention: Focused Focused Attention: Appears intact Memory: Appears intact Awareness: Appears intact Problem Solving: Appears intact       Comprehension  Auditory Comprehension Overall Auditory Comprehension: Appears within functional limits for tasks assessed Yes/No Questions: Not tested Commands: Within Functional Limits Conversation: Simple Visual Recognition/Discrimination Discrimination: Not tested Reading Comprehension Reading Status: Not tested    Expression Expression Primary Mode of Expression: Verbal Verbal Expression Overall Verbal Expression: Appears within functional limits for tasks assessed Initiation: No impairment Level of Generative/Spontaneous Verbalization: Conversation Repetition: No impairment Naming: No impairment Pragmatics: No impairment Non-Verbal Means of Communication: Not applicable Written Expression Dominant Hand: Right Written Expression: Not tested   Oral / Motor  Oral Motor/Sensory Function Overall Oral Motor/Sensory Function: Within functional limits Motor Speech Overall Motor Speech:  Appears within functional limits for tasks assessed Respiration: Within functional limits Phonation: Normal Resonance: Within functional limits Articulation: Within functional limitis Intelligibility: Intelligible Motor Planning: Witnin functional limits Motor Speech Errors: Not applicable  [facility] Student SLP  03/03/2022, 3:36 PM

## 2022-03-03 NOTE — TOC Initial Note (Signed)
Transition of Care [facility]) - Initial/Assessment Note    Patient Details  Name: Charles Farley MRN: [id number] Date of Birth: 19-Mar-1971  [facility] CM/SW Contact:    Pollie Friar, RN Phone Number: 03/03/2022, 3:23 PM  Clinical Narrative:                 Pt is from home with spouse and adult sons. Per spouse and pt they will have 24 hour supervision if not extremely close to after a rehab stay.  Pt was not taking any medications prior to admission and was driving self as needed.  Awaiting CIR eval.  TOC following.  Expected Discharge Plan: IP Rehab Facility Barriers to Discharge: Continued Medical Work up   Patient Goals and CMS Choice   CMS Medicare.gov Compare Post Acute Care list provided to:: Patient Choice offered to / list presented to : Patient, Spouse  Expected Discharge Plan and Services Expected Discharge Plan: [place]   Discharge Planning Services: CM Consult Post Acute Care Choice: IP Rehab Living arrangements for the past 2 months: Single Family Home                                      Prior Living Arrangements/Services Living arrangements for the past 2 months: Single Family Home Lives with:: Spouse, Adult Children Patient language and need for interpreter reviewed:: Yes Do you feel safe going back to the place where you live?: Yes      Need for Family Participation in Patient Care: Yes (Comment) Care giver support system in place?: Yes (comment)   Criminal Activity/Legal Involvement Pertinent to Current Situation/Hospitalization: No - Comment as needed  Activities of Daily Living Home Assistive Devices/Equipment: None ADL Screening (condition at time of admission) Patient's cognitive ability adequate to safely complete daily activities?: Yes Is the patient deaf or have difficulty hearing?: No Does the patient have difficulty seeing, even when wearing glasses/contacts?: No Does the patient have difficulty concentrating,  remembering, or making decisions?: No Patient able to express need for assistance with ADLs?: Yes Does the patient have difficulty dressing or bathing?: No Independently performs ADLs?: Yes (appropriate for developmental age) Does the patient have difficulty walking or climbing stairs?: No Weakness of Legs: Left Weakness of Arms/Hands: Left  Permission Sought/Granted                  Emotional Assessment Appearance:: Appears stated age Attitude/Demeanor/Rapport: Engaged Affect (typically observed): Accepting Orientation: : Oriented to Self, Oriented to Place, Oriented to  Time, Oriented to Situation   Psych Involvement: No (comment)  Admission diagnosis:  Left arm weakness [R29.898] Acute CVA (cerebrovascular accident) [facility]) [I63.9] Patient Active Problem List   Diagnosis Date Noted   Acute CVA (cerebrovascular accident) ([place]) 03/03/2022   HTN (hypertension) 03/02/2022   HLD (hyperlipidemia) 03/02/2022   Stroke-like symptoms 03/01/2022   Persistent adjustment disorder 02/12/2020   ADHD (attention deficit hyperactivity disorder) 02/12/2020   Passive suicidal ideations 02/12/2020   PCP:  Ferd Hibbs, NP Pharmacy:   [id number] - [place], [place] [address] Phone: [phone] Fax: [phone]     Social Determinants of Health (SDOH) Interventions    Readmission Risk Interventions     No data to display

## 2022-03-03 NOTE — Progress Notes (Signed)
[facility] PROGRESS NOTE   NATALE THOMA ZOX:[id number] DOB: [birth year] DOA: 03/01/2022  PCP: Lance Bosch, NP  Brief History/Interval Summary: 51 year old Caucasian male with a past medical history of essential hypertension though he is noncompliant with his medications, obesity, hyperlipidemia presented with left arm weakness.  Also complained of heaviness of the left lower extremity.  Patient tested positive for COVID-19 11 days prior to admission.  Does not have any respiratory symptoms currently.  Hospitalized for further management.  Consultants: Neurology.  Seen by telemetry neurology at [facility].    Procedures: Transthoracic echocardiogram    Subjective/Interval History: Patient continues to complain of left-sided weakness.  Denies any chest pain shortness of breath.  No nausea or vomiting   Assessment/Plan:  Left-sided weakness Patient had strokelike symptoms.  Patient was seen by telemetry neurology at [facility].  CT angiogram head and neck did not show any significant stenosis. MRI brain is pending LDL is 142.  HbA1c 5.1.  HIV nonreactive. Echocardiogram shows normal left ventricular systolic function with grade 1 diastolic dysfunction. PT and OT evaluation Noted to be on aspirin.  Will need to initiate Plavix once we have results of MRI and if it does show stroke.  We will also need to involve stroke MD once MRI results are back.  Hyperlipidemia Has been started on Lipitor.  He was previously noncompliant with the lipid-lowering agents.  Essential hypertension Currently allowing permissive hypertension. May not have been compliant with his antihypertensives.  Blood pressure noted to be elevated.  Will start him back on his medications once he is out of the acute phase of stroke.  Hypokalemia This was detected 2 days ago.  We will recheck labs.  Obesity Estimated body mass index is 42.72 kg/m as calculated from the  following:   Height as of this encounter: 6' (1.829 m).   Weight as of this encounter: 142.9 kg.   DVT Prophylaxis: Lovenox Code Status: Full code Family Communication: Discussed with patient Disposition Plan: To be determined  Status is: Observation The patient will require care spanning > 2 midnights and should be moved to inpatient because: left hemiparesis with concerns for acute stroke acute      Medications: Scheduled:   stroke: early stages of recovery book   Does not apply Once   aspirin EC  81 mg Oral Daily   atorvastatin  40 mg Oral Daily   enoxaparin (LOVENOX) injection  40 mg Subcutaneous Q24H   Continuous: WJX:[id number] **OR** acetaminophen (TYLENOL) oral liquid 160 mg/5 mL **OR** acetaminophen, LORazepam, perflutren lipid microspheres (DEFINITY) IV suspension  Antibiotics: Anti-infectives (From admission, onward)    None       Objective:  Vital Signs  Vitals:   03/03/22 0000 03/03/22 0400 03/03/22 0940 03/03/22 1155  BP: (!) 159/80 (!) 148/74 (!) 176/89 (!) 177/70  Pulse:  76 81 62  Resp: 18  18 18   Temp: 98.2 F (36.8 C) 97.7 F (36.5 C) 98.2 F (36.8 C) 98.3 F (36.8 C)  TempSrc: Oral Oral Oral Oral  SpO2:  99% 100% 100%  Weight:      Height:        Intake/Output Summary (Last 24 hours) at 03/03/2022 1203 Last data filed at 03/02/2022 2000 Gross per 24 hour  Intake 2253.52 ml  Output 400 ml  Net 1853.52 ml   Filed Weights   03/01/22 2004  Weight: (!) 142.9 kg    General appearance: Awake alert.  In no distress Resp:  Clear to auscultation bilaterally.  Normal effort Cardio: S1-S2 is normal regular.  No S3-S4.  No rubs murmurs or bruit GI: Abdomen is soft.  Nontender nondistended.  Bowel sounds are present normal.  No masses organomegaly Extremities: No edema.  Full range of motion of lower extremities. Neurologic: Alert and oriented x3.  Left hemiparesis noted   Lab Results:  Data Reviewed: I have personally reviewed  following labs and reports of the imaging studies  CBC: Recent Labs  Lab 03/01/22 2013  WBC 9.2  NEUTROABS 5.6  HGB 14.6  HCT 42.1  MCV 83.0  PLT 970    Basic Metabolic Panel: Recent Labs  Lab 03/01/22 2013  NA 140  K 3.3*  CL 106  CO2 27  GLUCOSE 113*  BUN 16  CREATININE 0.98  CALCIUM 8.4*    GFR: Estimated Creatinine Clearance: 132.3 mL/min (by C-G formula based on SCr of 0.98 mg/dL).  Liver Function Tests: Recent Labs  Lab 03/01/22 2013  AST 18  ALT 23  ALKPHOS 52  BILITOT 0.6  PROT 7.0  ALBUMIN 3.9     Coagulation Profile: Recent Labs  Lab 03/01/22 2013  INR 1.0     HbA1C: Recent Labs    03/03/22 0444  HGBA1C 5.1     Lipid Profile: Recent Labs    03/01/22 2013  CHOL 223*  HDL 35*  LDLCALC 142*  TRIG 232*  CHOLHDL 6.4     Radiology Studies: ECHOCARDIOGRAM COMPLETE  Result Date: 03/03/2022    ECHOCARDIOGRAM REPORT   Patient Name:   Charles Farley Date of Exam: 03/03/2022 Medical Rec #:  [id number]     Height:       72.0 in Accession #:    [id number]    Weight:       315.0 lb Date of Birth:  [birth year]     BSA:          2.584 m Patient Age:    51 years      BP:           176/89 mmHg Patient Gender: M             HR:           69 bpm. Exam Location:  Inpatient Procedure: 2D Echo, Cardiac Doppler, Color Doppler and Intracardiac            Opacification Agent Indications:    Stroke I63.9  History:        Patient has no prior history of Echocardiogram examinations.                 Risk Factors:Hypertension, Dyslipidemia and Non-Smoker.  Sonographer:    Greer Pickerel Referring Phys: [id number] JARED M GARDNER  Sonographer Comments: Technically difficult study due to poor echo windows, no subcostal window and patient is obese. Image acquisition challenging due to respiratory motion. IMPRESSIONS  1. Left ventricular ejection fraction, by estimation, is 60 to 65%. The left ventricle has normal function. The left ventricle has no regional wall motion  abnormalities. There is mild concentric left ventricular hypertrophy. Left ventricular diastolic parameters are consistent with Grade I diastolic dysfunction (impaired relaxation).  2. Right ventricular systolic function is normal. The right ventricular size is normal. Tricuspid regurgitation signal is inadequate for assessing PA pressure.  3. The mitral valve is normal in structure. Trivial mitral valve regurgitation. No evidence of mitral stenosis.  4. The aortic valve is tricuspid. Aortic valve regurgitation is mild. No aortic stenosis is present.  5. The inferior vena cava is normal in size with greater than 50% respiratory variability, suggesting right atrial pressure of 3 mmHg. Comparison(s): No prior Echocardiogram. Conclusion(s)/Recommendation(s): No intracardiac source of embolism detected on this transthoracic study. Consider a transesophageal echocardiogram to exclude cardiac source of embolism if clinically indicated. FINDINGS  Left Ventricle: Left ventricular ejection fraction, by estimation, is 60 to 65%. The left ventricle has normal function. The left ventricle has no regional wall motion abnormalities. Definity contrast agent was given IV to delineate the left ventricular  endocardial borders. The left ventricular internal cavity size was normal in size. There is mild concentric left ventricular hypertrophy. Left ventricular diastolic parameters are consistent with Grade I diastolic dysfunction (impaired relaxation). Right Ventricle: The right ventricular size is normal. No increase in right ventricular wall thickness. Right ventricular systolic function is normal. Tricuspid regurgitation signal is inadequate for assessing PA pressure. Left Atrium: Left atrial size was normal in size. Right Atrium: Right atrial size was normal in size. Pericardium: There is no evidence of pericardial effusion. Mitral Valve: The mitral valve is normal in structure. Trivial mitral valve regurgitation. No evidence of  mitral valve stenosis. Tricuspid Valve: The tricuspid valve is normal in structure. Tricuspid valve regurgitation is trivial. Aortic Valve: The aortic valve is tricuspid. Aortic valve regurgitation is mild. No aortic stenosis is present. Pulmonic Valve: The pulmonic valve was normal in structure. Pulmonic valve regurgitation is trivial. Aorta: The aortic root and ascending aorta are structurally normal, with no evidence of dilitation. Venous: The inferior vena cava is normal in size with greater than 50% respiratory variability, suggesting right atrial pressure of 3 mmHg. IAS/Shunts: The atrial septum is grossly normal.  LEFT VENTRICLE PLAX 2D LVIDd:         5.10 cm   Diastology LVIDs:         4.00 cm   LV e' medial:    5.26 cm/s LV PW:         1.00 cm   LV E/e' medial:  13.5 LV IVS:        1.00 cm   LV e' lateral:   9.90 cm/s LVOT diam:     2.20 cm   LV E/e' lateral: 7.2 LV SV:         81 LV SV Index:   31 LVOT Area:     3.80 cm  RIGHT VENTRICLE RV S prime:     19.60 cm/s TAPSE (M-mode): 2.6 cm LEFT ATRIUM             Index        RIGHT ATRIUM           Index LA diam:        3.80 cm 1.47 cm/m   RA Area:     17.20 cm LA Vol (A2C):   42.9 ml 16.60 ml/m  RA Volume:   44.60 ml  17.26 ml/m LA Vol (A4C):   46.2 ml 17.88 ml/m LA Biplane Vol: 45.1 ml 17.45 ml/m  AORTIC VALVE LVOT Vmax:   111.00 cm/s LVOT Vmean:  71.800 cm/s LVOT VTI:    0.212 m  AORTA Ao Root diam: 3.60 cm Ao Asc diam:  3.40 cm MITRAL VALVE MV Area (PHT): 2.87 cm    SHUNTS MV Decel Time: 264 msec    Systemic VTI:  0.21 m MV E velocity: 71.00 cm/s  Systemic Diam: 2.20 cm MV A velocity: 97.80 cm/s MV E/A ratio:  0.73 Laurance Flatten MD Electronically signed by Herbert Seta  Pemberton MD Signature Date/Time: 03/03/2022/11:44:33 AM    Final    CT ANGIO HEAD NECK W WO CM  Result Date: 03/01/2022 : Ordering Physician: Jacky Kindle, MD. At time of dictation, Deatra Robinson is erroneously listed in the exam header as the ordering physician. The ordering  provider information is correct in Epic. CLINICAL DATA:  Left-sided weakness with expressive aphasia EXAM: CT ANGIOGRAPHY HEAD AND NECK TECHNIQUE: Multidetector CT imaging of the head and neck was performed using the standard protocol during bolus administration of intravenous contrast. Multiplanar CT image reconstructions and MIPs were obtained to evaluate the vascular anatomy. Carotid stenosis measurements (when applicable) are obtained utilizing NASCET criteria, using the distal internal carotid diameter as the denominator. RADIATION DOSE REDUCTION: This exam was performed according to the departmental dose-optimization program which includes automated exposure control, adjustment of the mA and/or kV according to patient size and/or use of iterative reconstruction technique. CONTRAST:  76mL OMNIPAQUE IOHEXOL 350 MG/ML SOLN COMPARISON:  None Available. FINDINGS: CTA NECK FINDINGS SKELETON: There is no bony spinal canal stenosis. No lytic or blastic lesion. OTHER NECK: Normal pharynx, larynx and major salivary glands. No cervical lymphadenopathy. Unremarkable thyroid gland. UPPER CHEST: No pneumothorax or pleural effusion. No nodules or masses. AORTIC ARCH: There is no calcific atherosclerosis of the aortic arch. There is no aneurysm, dissection or hemodynamically significant stenosis of the visualized portion of the aorta. Conventional 3 vessel aortic branching pattern. The visualized proximal subclavian arteries are widely patent. RIGHT CAROTID SYSTEM: Normal without aneurysm, dissection or stenosis. LEFT CAROTID SYSTEM: Normal without aneurysm, dissection or stenosis. VERTEBRAL ARTERIES: Right dominant configuration. Both origins are clearly patent. There is no dissection, occlusion or flow-limiting stenosis to the skull base (V1-V3 segments). CTA HEAD FINDINGS POSTERIOR CIRCULATION: --Vertebral arteries: Normal V4 segments. --Inferior cerebellar arteries: Normal. --Basilar artery: Normal. --Superior cerebellar  arteries: Normal. --Posterior cerebral arteries (PCA): Normal. ANTERIOR CIRCULATION: --Intracranial internal carotid arteries: Normal. --Anterior cerebral arteries (ACA): Normal. Both A1 segments are present. Patent anterior communicating artery (a-comm). --Middle cerebral arteries (MCA): Normal. VENOUS SINUSES: As permitted by contrast timing, patent. ANATOMIC VARIANTS: None Review of the MIP images confirms the above findings. IMPRESSION: Normal CTA of the head and neck Electronically Signed   By: Deatra Robinson M.D.   On: 03/01/2022 23:04   CT HEAD WO CONTRAST  Result Date: 03/01/2022 CLINICAL DATA:  Neuro deficit, acute stroke suspected. Last known well noon today. Heaviness in the left arm and leg. EXAM: CT HEAD WITHOUT CONTRAST TECHNIQUE: Contiguous axial images were obtained from the base of the skull through the vertex without intravenous contrast. RADIATION DOSE REDUCTION: This exam was performed according to the departmental dose-optimization program which includes automated exposure control, adjustment of the mA and/or kV according to patient size and/or use of iterative reconstruction technique. COMPARISON:  None Available. FINDINGS: Brain: No evidence of acute infarction, hemorrhage, hydrocephalus, extra-axial collection or mass lesion/mass effect. Vascular: No hyperdense vessel or unexpected calcification. Skull: Normal. Negative for fracture or focal lesion. Sinuses/Orbits: No acute finding. Other: None. IMPRESSION: No acute intracranial pathology. Aspects score of 10. Electronically Signed   By: Larose Hires D.O.   On: 03/01/2022 20:35       LOS: 0 days   Lian Pounds Rito Ehrlich  [facility] Pager on [email or web address]  03/03/2022, 12:03 PM

## 2022-03-03 NOTE — Evaluation (Signed)
Occupational Therapy Evaluation Patient Details Name: Charles Farley MRN: [id number] DOB: November 23, 1970 Today's Date: 03/03/2022   History of Present Illness Charles Farley is a 51 y.o. male who presented to ED with c/o L UE/LE weakness and heaviness sensation with some expressive aphasia.Stroke work up underway. 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  Currently requires min A with mobility and ADL tasks due to deficits listed below.  Feel pt would benefit from intensive rehab at [facility] to reach modified independent level goals to facilitate a safe DC home. Acute OT to follow.      Recommendations for follow up therapy are one component of a multi-disciplinary discharge planning process, led by the attending physician.  Recommendations may be updated based on patient status, additional functional criteria and insurance authorization.   Follow Up Recommendations  Acute inpatient rehab (3hours/day)    Assistance Recommended at Discharge Intermittent Supervision/Assistance  Patient can return home with the following A little help with walking and/or transfers;A little help with bathing/dressing/bathroom;Assistance with cooking/housework;Assist for transportation;Help with stairs or ramp for entrance    Functional Status Assessment  Patient has had a recent decline in their functional status and demonstrates the ability to make significant improvements in function in a reasonable and predictable amount of time.  Equipment Recommendations  BSC/3in1    Recommendations for Other Services PT consult;Rehab consult     Precautions / Restrictions Precautions Precautions: Fall  Precaution Comments: LLE weakness; fall in [facility]/ED Restrictions Weight Bearing Restrictions: No      Mobility Bed Mobility Overal bed mobility: Needs Assistance Bed Mobility: Sit to  Supine Rolling: Min guard Sidelying to sit: Min assist   Sit to supine: Min guard   General bed mobility comments: Pt states it is difficult to lift his L leg onto the bed    Transfers Overall transfer level: Needs assistance Equipment used: None Transfers: Sit to/from Stand Sit to Stand: Min guard           General transfer comment: minA to power up, pt with L lateral lean      Balance Overall balance assessment: Needs assistance, History of Falls (had a fall in the [facility] ED  ) Sitting-balance support: Feet supported, No upper extremity supported Sitting balance-Leahy Scale: Fair Sitting balance - Comments: note mild L lateral lean during dynamic tasks   Standing balance support: During functional activity, Single extremity supported Standing balance-Leahy Scale: Poor Standing balance comment: pt requiring external assist for safe ambulation, pt with L lateral bias                           ADL either performed or assessed with clinical judgement   ADL Overall ADL's : Needs assistance/impaired     Grooming: Supervision/safety   Upper Body Bathing: Set up;Supervision/ safety;Sitting   Lower Body Bathing: Minimal assistance;Sit to/from stand   Upper Body Dressing : Minimal assistance   Lower Body Dressing: Minimal assistance;Sit to/from stand   Toilet Transfer: Minimal assistance;Ambulation   Toileting- Clothing Manipulation and Hygiene: Minimal assistance;Sit to/from stand       Functional mobility during ADLs: Minimal assistance;Rolling walker (2 wheels) General ADL Comments: feels he does better with use of a RW     Vision Baseline Vision/History: 1 Wears glasses Ability to See in  Adequate Light: 0 Adequate Patient Visual Report: No change from baseline Vision Assessment?: Yes Eye Alignment: Within Functional Limits Ocular Range of Motion: Within Functional Limits Alignment/Gaze Preference: Within Defined Limits Tracking/Visual  Pursuits: Able to track stimulus in all quads without difficulty Saccades: Within functional limits Convergence: Within functional limits Visual Fields: No apparent deficits     Perception Perception Comments: will further assess; ? L inattention noted with possible motor impersistence   Praxis Praxis Praxis-Other Comments: appears to have a difficulty sustaining motor activity    Pertinent Vitals/Pain Pain Assessment Pain Assessment: No/denies pain     Hand Dominance Right   Extremity/Trunk Assessment Upper Extremity Assessment Upper Extremity Assessment: LUE deficits/detail LUE Deficits / Details: AROM overall WFL. minimally weaker than R dominant UE; spontaneously attempting to use for functional tasks; fine motor/in-hand manipulation skill deficits - "clumsy" hand; states his arm "feels heavy" LUE Sensation: decreased proprioception ? (will continue to assess) LUE Coordination: decreased fine motor; decreased gross motor   Lower Extremity Assessment Lower Extremity Assessment: Defer to PT evaluation LLE Deficits / Details: mild weakness, noted mild ataxia with ambulation, pt reports feeling super weak LLE Sensation:  (feeling of heaviness) LLE Coordination: decreased gross motor   Cervical / Trunk Assessment Cervical / Trunk Assessment: Normal   Communication Communication Communication: Other (comment) (reports change in voice unsure if that is from covid; appears mildly dysarthric)   Cognition Arousal/Alertness: Awake/alert Behavior During Therapy: WFL for tasks assessed/performed Overall Cognitive Status: Within Functional Limits for tasks assessed                                 General Comments: pt aware of symptoms and deficits, appears intact; will continue to assess     General Comments  VSS    Exercises Exercises: Other exercises Other Exercises Other Exercises: encouraged use of L hand/UE - given ideas of tasks to work on while in room    Shoulder Instructions      Home Living Family/patient expects to be discharged to:: Private residence Living Arrangements: Spouse/significant other Available Help at Discharge: Family;Available PRN/intermittently (wife is a NP in [place]) Type of Home: House Home Access: Stairs to enter [employer] of Steps: 3 Entrance Stairs-Rails: Can reach both Home Layout: Two level Alternate Level Stairs-Number of Steps: flight Alternate Level Stairs-Rails: Right Bathroom Shower/Tub: Producer, television/film/video: Standard Bathroom Accessibility: Yes How Accessible: Accessible via walker Home Equipment: None          Prior Functioning/Environment Prior Level of Function : Independent/Modified Independent;Driving;Working/employed             Mobility Comments: indep, 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 ADLs Comments: enjoys reading and watching TV        OT Problem List: Decreased strength;Decreased activity tolerance;Impaired balance (sitting and/or standing);Decreased coordination;Decreased safety awareness;Impaired sensation;Obesity;Impaired UE functional use      OT Treatment/Interventions: Self-care/ADL training;Therapeutic exercise;Neuromuscular education;DME and/or AE instruction;Therapeutic activities;Cognitive remediation/compensation;Visual/perceptual remediation/compensation;Patient/family education;Balance training    OT Goals(Current goals can be found in the care plan section) Acute Rehab OT Goals Patient Stated Goal: to get the use back in his leg and arm OT Goal Formulation: With patient Time For Goal Achievement: 03/17/22 Potential to Achieve Goals: Good  OT Frequency: Min 2X/week    Co-evaluation              AM-PAC OT "6 Clicks" Daily Activity     Outcome Measure  Help from another person eating meals?: None Help from another person taking care of personal grooming?: A Little Help from another person toileting, which includes  using toliet, bedpan, or urinal?: A Little Help from another person bathing (including washing, rinsing, drying)?: A Little Help from another person to put on and taking off regular upper body clothing?: A Little Help from another person to put on and taking off regular lower body clothing?: A Little 6 Click Score: 19   End of Session Equipment Utilized During Treatment: Gait belt;Rolling walker (2 wheels) Nurse Communication: Mobility status  Activity Tolerance: Patient tolerated treatment well Patient left: in bed;with call bell/phone within reach;Other (comment) (echo tech present)  OT Visit Diagnosis: Unsteadiness on feet (R26.81);Other abnormalities of gait and mobility (R26.89);History of falling (Z91.81);Muscle weakness (generalized) (M62.81)                Time: 5701-7793 OT Time Calculation (min): 24 min Charges:  OT General Charges $OT Visit: 1 Visit OT Evaluation $OT Eval Moderate Complexity: 1 Mod OT Treatments $Self Care/Home Management : 8-22 mins  Maurie Boettcher, OT/L   Acute OT Clinical Specialist Acute Rehabilitation Services Pager [phone] Office [phone]   [facility] 03/03/2022, 12:21 PM

## 2022-03-03 NOTE — Evaluation (Signed)
Physical Therapy Evaluation Patient Details Name: Charles Farley MRN: [id number] DOB: 1971-04-23 Today's Date: 03/03/2022  History of Present Illness  Charles Farley is a 51 y.o. male who presented to ED with c/o L UE/LE weakness and heaviness sensation with some expressive aphasia. PMH: HTN, obesity, had COVID 11 days ago   Clinical Impression  Pt admitted with above. Awaiting MRI results however pt presenting with impaired coordination and weakness in both L UE and LE. Pt with ataxia and decreased ability to clear L foot during ambulation with noted L knee instability requiring modA via HHA for safe ambulation. Suspect pt my do a little better with RW,will attempt next session. 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 Pt to benefit from [facility] Upon d/c for aggressive rehab program to achieve maximal functional recovery. Pt very motivated and demonstrates excellent rehab potential. Acute PT to cont to follow.       Recommendations for follow up therapy are one component of a multi-disciplinary discharge planning process, led by the attending physician.  Recommendations may be updated based on patient status, additional functional criteria and insurance authorization.  Follow Up Recommendations Acute inpatient rehab (3hours/day)      Assistance Recommended at Discharge Frequent or constant Supervision/Assistance  Patient can return home with the following  A lot of help with walking and/or transfers;A little help with bathing/dressing/bathroom;Assistance with cooking/housework;Assist for transportation;Help with stairs or ramp for entrance    Equipment Recommendations  (TBD at next venue)  Recommendations for Other Services  Rehab consult    Functional Status Assessment Patient has had a recent decline in their functional status and demonstrates the ability to make significant improvements in function in a reasonable and predictable amount of time.      Precautions / Restrictions Precautions Precautions: Fall Precaution Comments: L LE ataxia Restrictions Weight Bearing Restrictions: No      Mobility  Bed Mobility Overal bed mobility: Needs Assistance Bed Mobility: Rolling, Sidelying to Sit Rolling: Min guard Sidelying to sit: Min assist       General bed mobility comments: increased time, labored effort however could be more related to body habitus. Pt with limited use of L UE to assist    Transfers Overall transfer level: Needs assistance Equipment used: None Transfers: Sit to/from Stand Sit to Stand: Min assist           General transfer comment: minA to power up, pt with L lateral lean    Ambulation/Gait Ambulation/Gait assistance: Mod assist Gait Distance (Feet): 60 Feet Assistive device: 1 person hand held assist Gait Pattern/deviations: Step-through pattern, Decreased stride length, Ataxic, Decreased weight shift to right, Decreased dorsiflexion - left, Knees buckling, Drifts right/left Gait velocity: dec compared to baseline Gait velocity interpretation: <1.8 ft/sec, indicate of risk for recurrent falls   General Gait Details: pt with L lateral bias, mild L LE ataxia with noted L knee instability/buckling, modA at L lateral trunk to promote R lateral weight shift, pt with decreased L step height borderline shuffle, pt aware  Stairs            Wheelchair Mobility    Modified Rankin (Stroke Patients Only) Modified Rankin (Stroke Patients Only) Pre-Morbid Rankin Score: No symptoms Modified Rankin: Moderately severe disability     Balance Overall balance assessment: Needs assistance Sitting-balance support: Feet supported, No upper extremity supported Sitting balance-Leahy Scale: Good Sitting balance - Comments: pt able to reach down to feet to fix socks  without LOB   Standing balance support: During functional activity, Single extremity supported Standing balance-Leahy Scale: Poor Standing  balance comment: pt requiring external assist for safe ambulation, pt with L lateral bias                             Pertinent Vitals/Pain Pain Assessment Pain Assessment: No/denies pain    Home Living Family/patient expects to be discharged to:: Private residence Living Arrangements: Spouse/significant other Available Help at Discharge: Family;Available PRN/intermittently (wife is a NP in [place]) Type of Home: House Home Access: Stairs to enter Entrance Stairs-Rails: Can reach both Entrance Stairs-Number of Steps: 3 Alternate Level Stairs-Number of Steps: flight Home Layout: Two level Home Equipment: None      Prior Function Prior Level of Function : Independent/Modified Independent;Driving;Working/employed             Mobility Comments: indep, is a Therapist, 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 ADLs Comments: inep     Hand Dominance   Dominant Hand: Right    Extremity/Trunk Assessment   Upper Extremity Assessment Upper Extremity Assessment: Defer to OT evaluation    Lower Extremity Assessment Lower Extremity Assessment: LLE deficits/detail LLE Deficits / Details: mild weakness, noted mild ataxia with ambulation, pt reports feeling super weak LLE Sensation: decreased light touch (feeling of heaviness) LLE Coordination: decreased gross motor    Cervical / Trunk Assessment Cervical / Trunk Assessment: Normal  Communication   Communication: No difficulties (reports change in voice unsure if that is from covid)  Cognition Arousal/Alertness: Awake/alert Behavior During Therapy: WFL for tasks assessed/performed Overall Cognitive Status: Within Functional Limits for tasks assessed                                 General Comments: pt aware of symptoms, deficits, and demo's good safety awareness.        General Comments General comments (skin integrity, edema, etc.): VSS    Exercises     Assessment/Plan    PT Assessment Patient needs  continued PT services  PT Problem List Decreased strength;Decreased range of motion;Decreased activity tolerance;Decreased balance;Decreased mobility;Decreased coordination;Decreased cognition;Decreased knowledge of use of DME       PT Treatment Interventions DME instruction;Gait training;Stair training;Functional mobility training;Therapeutic activities;Therapeutic exercise;Balance training;Neuromuscular re-education    PT Goals (Current goals can be found in the Care Plan section)  Acute Rehab PT Goals Patient Stated Goal: get better quickly PT Goal Formulation: With patient Time For Goal Achievement: 03/17/22 Potential to Achieve Goals: Good    Frequency Min 4X/week     Co-evaluation               AM-PAC PT "6 Clicks" Mobility  Outcome Measure Help needed turning from your back to your side while in a flat bed without using bedrails?: A Little Help needed moving from lying on your back to sitting on the side of a flat bed without using bedrails?: A Little Help needed moving to and from a bed to a chair (including a wheelchair)?: A Little Help needed standing up from a chair using your arms (e.g., wheelchair or bedside chair)?: A Lot Help needed to walk in hospital room?: A Lot Help needed climbing 3-5 steps with a railing? : A Lot 6 Click Score: 15    End of Session Equipment Utilized During Treatment: Gait belt Activity Tolerance: Patient tolerated treatment well Patient left: in bed;with call  bell/phone within reach (sitting EOB with OT) Nurse Communication: Mobility status PT Visit Diagnosis: Unsteadiness on feet (R26.81);Muscle weakness (generalized) (M62.81);Difficulty in walking, not elsewhere classified (R26.2)    Time: 0910-0930 PT Time Calculation (min) (ACUTE ONLY): 20 min   Charges:   PT Evaluation $PT Eval Moderate Complexity: 1 Mod          Lewis Shock, PT, DPT Acute Rehabilitation Services Secure chat preferred Office #:  [phone]   Iona Hansen 03/03/2022, 11:45 AM

## 2022-03-03 NOTE — Progress Notes (Signed)
Inpatient Rehab Coordinator Note:  I met with patient at bedside to discuss CIR recommendations and goals/expectations of CIR stay.  We reviewed 3 hrs/day of therapy, physician follow up, and average length of stay 2 weeks (dependent upon progress) with goals of mod I.  Patient and spouse report family can provide 24/7 assistance upon discharge. Will send information to open insurance. Will continue to follow for potential admission.   Rehab Admissons Coordinator [place], [place], [place] [phone]

## 2022-03-03 NOTE — Progress Notes (Signed)
Patient off unit to MRI. Transported via wheelchair by patient Automotive engineer.

## 2022-03-03 NOTE — Plan of Care (Signed)
  Problem: Education: Goal: Knowledge of General Education information will improve Description: Including pain rating scale, medication(s)/side effects and non-pharmacologic comfort measures Outcome: Progressing   Problem: Health Behavior/Discharge Planning: Goal: Ability to manage health-related needs will improve Outcome: Progressing   Problem: Clinical Measurements: Goal: Ability to maintain clinical measurements within normal limits will improve Outcome: Progressing Goal: Will remain free from infection Outcome: Progressing Goal: Diagnostic test results will improve Outcome: Progressing Goal: Respiratory complications will improve Outcome: Progressing Goal: Cardiovascular complication will be avoided Outcome: Progressing   Problem: Activity: Goal: Risk for activity intolerance will decrease Outcome: Progressing   Problem: Nutrition: Goal: Adequate nutrition will be maintained Outcome: Progressing   Problem: Coping: Goal: Level of anxiety will decrease Outcome: Progressing   Problem: Elimination: Goal: Will not experience complications related to bowel motility Outcome: Progressing Goal: Will not experience complications related to urinary retention Outcome: Progressing   Problem: Pain Managment: Goal: General experience of comfort will improve Outcome: Progressing   Problem: Safety: Goal: Ability to remain free from injury will improve Outcome: Progressing   Problem: Skin Integrity: Goal: Risk for impaired skin integrity will decrease Outcome: Progressing   Problem: Education: Goal: Knowledge of disease or condition will improve Outcome: Progressing Goal: Knowledge of secondary prevention will improve (SELECT ALL) Outcome: Progressing Goal: Knowledge of patient specific risk factors will improve (INDIVIDUALIZE FOR PATIENT) Outcome: Progressing Goal: Individualized Educational Video(s) Outcome: Progressing   Problem: Coping: Goal: Will verbalize  positive feelings about self Outcome: Progressing   Problem: Health Behavior/Discharge Planning: Goal: Ability to manage health-related needs will improve Outcome: Progressing   Problem: Self-Care: Goal: Ability to participate in self-care as condition permits will improve Outcome: Progressing   Problem: Nutrition: Goal: Risk of aspiration will decrease Outcome: Progressing   

## 2022-03-03 NOTE — Consult Note (Addendum)
Neurology Consultation  Reason for Consult: Stroke on MRI Referring Physician: Rito Ehrlich  CC: left sided weakness, numbness, and word finding difficulties   History is obtained from: Patient, chart review  HPI: Charles Farley is a 51 y.o. male with a past medical history of HTN, HLD, COVID positive 11 days ago presenting with left sided numbness, weakness, and word finding difficulty. Patient reports having issues with his balance around 1200 on 10/14 and a transient episode of expressive asphasia.  These symptoms have started to improve, he is still having abnormal sensation and weakness on the left side. He denies further episodes of expressive aphasia. He denies nausea, vomiting, headache, palpitations, breathing difficulty, or previous neurological episodes. He does not take a baby aspirin at baseline. He has not been compliant with his medications, but he is willing to resume them including valsartan and crestor. He does not smoke, drink, or use illicit substances.  LDL 142, Ha1c 5.1, 2D echo EF 60-65%, BP on arrival to [facility] was 172/79   LKW: 1200 10/14  IV thrombolysis given?: no, outside of window Premorbid modified Rankin scale (mRS):  0-Completely asymptomatic and back to baseline post-stroke  ROS: Full ROS was performed and is negative except as noted in the HPI.   Past Medical History:  Diagnosis Date   ADHD    Hypertension     No family history on file.  Social History:   reports that he has never smoked. He has never used smokeless tobacco. He reports that he does not currently use alcohol. He reports that he does not use drugs.  Medications  Current Facility-Administered Medications:     stroke: early stages of recovery book, , Does not apply, Once, Julian Reil, Jared M, DO   acetaminophen (TYLENOL) tablet 650 mg, 650 mg, Oral, Q4H PRN **OR** acetaminophen (TYLENOL) 160 MG/5ML solution 650 mg, 650 mg, Per Tube, Q4H PRN **OR** acetaminophen (TYLENOL)  suppository 650 mg, 650 mg, Rectal, Q4H PRN, Julian Reil, Jared M, DO   aspirin EC tablet 81 mg, 81 mg, Oral, Daily, Julian Reil, Jared M, DO, 81 mg at 03/03/22 1127   atorvastatin (LIPITOR) tablet 40 mg, 40 mg, Oral, Daily, Julian Reil, Jared M, DO, 40 mg at 03/03/22 1127   enoxaparin (LOVENOX) injection 40 mg, 40 mg, Subcutaneous, Q24H, Julian Reil, Jared M, DO   LORazepam (ATIVAN) injection 1 mg, 1 mg, Intravenous, Once PRN, Julian Reil, Jared M, DO   perflutren lipid microspheres (DEFINITY) IV suspension, 1-10 mL, Intravenous, PRN, Hillary Bow, DO, 8 mL at 03/03/22 1111  Exam: Current vital signs: BP (!) 177/70 (BP Location: Right Arm)   Pulse 62   Temp 98.3 F (36.8 C) (Oral)   Resp 18   Ht 6' (1.829 m)   Wt (!) 142.9 kg   SpO2 100%   BMI 42.72 kg/m  Vital signs in last 24 hours: Temp:  [97.7 F (36.5 C)-99.2 F (37.3 C)] 98.3 F (36.8 C) (10/16 1155) Pulse Rate:  [62-88] 62 (10/16 1155) Resp:  [18-26] 18 (10/16 1155) BP: (130-183)/(70-90) 177/70 (10/16 1155) SpO2:  [94 %-100 %] 100 % (10/16 1155)  GENERAL: Awake, alert in NAD HEENT: - Normocephalic and atraumatic, dry mm, no LN++, no Thyromegally LUNGS - Clear to auscultation bilaterally with no wheezes CV - S1S2 RRR, no m/r/g, equal pulses bilaterally. ABDOMEN - Obese, Soft, nontender, nondistended with normoactive BS Ext: warm, well perfused, intact peripheral pulses, no edema  NEURO:  Mental Status: AA&Ox3  Language: speech is clear.  Naming, repetition, fluency,  and comprehension intact. Cranial Nerves: PERRL, EOMI,=, visual fields full, no facial asymmetry, facial sensation intact, hearing intact, tongue/uvula/soft palate midline, normal sternocleidomastoid and trapezius muscle strength. No evidence of tongue atrophy or fibrillations Motor:  RUE 5/5 LUE 3+/5 with palmar drift RLE 5/5 LLE 4/5 Tone: is normal and bulk is normal Sensation- Intact to light touch bilaterally, subjective feeling of his left upper and lower extremity  "falling asleep"  Coordination: FTN intact bilaterally, no ataxia in BLE. Gait- deferred  NIHSS 1a Level of Conscious.: 0 1b LOC Questions: 0 1c LOC Commands: 0 2 Best Gaze: 0 3 Visual: 0 4 Facial Palsy: 0 5a Motor Arm - left: 0 5b Motor Arm - Right: 0 6a Motor Leg - Left: 0 6b Motor Leg - Right: 0 7 Limb Ataxia: 0 8 Sensory: 0 9 Best Language: 0 10 Dysarthria: 0 11 Extinct. and Inatten.: 0 TOTAL: 0   Labs I have reviewed labs in epic and the results pertinent to this consultation are:  CBC    Component Value Date/Time   WBC 9.2 03/01/2022 2013   RBC 5.07 03/01/2022 2013   HGB 14.6 03/01/2022 2013   HCT 42.1 03/01/2022 2013   PLT 297 03/01/2022 2013   MCV 83.0 03/01/2022 2013   MCH 28.8 03/01/2022 2013   MCHC 34.7 03/01/2022 2013   RDW 13.2 03/01/2022 2013   LYMPHSABS 2.3 03/01/2022 2013   MONOABS 0.9 03/01/2022 2013   EOSABS 0.3 03/01/2022 2013   BASOSABS 0.1 03/01/2022 2013    CMP     Component Value Date/Time   NA 140 03/01/2022 2013   K 3.3 (L) 03/01/2022 2013   CL 106 03/01/2022 2013   CO2 27 03/01/2022 2013   GLUCOSE 113 (H) 03/01/2022 2013   BUN 16 03/01/2022 2013   CREATININE 0.98 03/01/2022 2013   CALCIUM 8.4 (L) 03/01/2022 2013   PROT 7.0 03/01/2022 2013   ALBUMIN 3.9 03/01/2022 2013   AST 18 03/01/2022 2013   ALT 23 03/01/2022 2013   ALKPHOS 52 03/01/2022 2013   BILITOT 0.6 03/01/2022 2013   GFRNONAA >60 03/01/2022 2013   GFRAA >60 02/11/2020 2222    Lipid Panel     Component Value Date/Time   CHOL 223 (H) 03/01/2022 2013   TRIG 232 (H) 03/01/2022 2013   HDL 35 (L) 03/01/2022 2013   CHOLHDL 6.4 03/01/2022 2013   VLDL 46 (H) 03/01/2022 2013   [place] 142 (H) 03/01/2022 2013     Imaging I have reviewed the images obtained:  CT-head- No acute intracranial pathology. Aspects score of 10. CTA Head and Neck- Normal CTA of head and neck MRI examination of the brain- Acute infarct in the posterior limb of the right internal capsule.   Echo 2D- EF 48-18%, grade 1 diastolic dysfunction  Assessment:  Charles Farley is a 51 y.o. male with a past medical history of HTN, HLD, COVID positive 11 days ago presenting with left sided numbness, weakness, and word finding difficulty. Patient reports having issues with his balance around 1200 on 10/14 and a transient episode of expressive asphasia.  MRI is positive for an acute lacunar infarct in the right internal capsule. Plan for DAPT with aspirin 81mg  and plavix 75mg  for 3 weeks and then aspirin 81mg  alone. 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 MD to update note as needed.   , DNP, FNP-BC [facility] Pager: [phone]  ATTENDING NOTE: I reviewed above note and agree with the assessment and plan. Pt was seen and examined.   51 year old male with history of hypertension, hyperlipidemia, recent COVID infection, morbid obesity and possible OSA admitted for left-sided numbness, weakness and speech difficulty.  CT no acute abnormality.  CTA head and neck unremarkable, MRI showed right PLIC small infarct.  EF 60 to 65%, A1c 5.1, UDS negative, LDL 142, TG 232.  BP elevated.  Patient symptom has improved.  On exam, wife at bedside, patient still has mild left facial droop, left mild decrease of handgrip and dexterity.  Otherwise neurologically intact.  Etiology for patient stroke likely due to small vessel disease given risk factors.  Recommend aspirin 81 and Plavix 75 DAPT for 3 weeks and then aspirin alone.  Continue Crestor 40 which he had stopped for the last 2  weeks due to COVID infection.  PT/OT recommend CIR.  Aggressive risk factor modification.  Will refer to neurology for stroke follow-up as well as OSA evaluation.  For detailed assessment and plan, please refer to above/below as I have made changes wherever appropriate.   Neurology will sign off. Please call with questions. Pt will follow up with Dr. [phone] at [facility] in about 4 weeks. Thanks for the consult.   Vickey Huger, MD PhD Stroke Neurology 03/03/2022 6:47 PM

## 2022-03-03 NOTE — Progress Notes (Signed)
? ?  Inpatient Rehab Admissions Coordinator : ? ?Per therapy recommendations, patient was screened for CIR candidacy by Maudine Kluesner RN MSN.  At this time patient appears to be a potential candidate for CIR. I will place a rehab consult per protocol for full assessment. Please call me with any questions. ? ?Jennylee Uehara RN MSN ?Admissions Coordinator ?[phone] ?  ?

## 2022-03-03 NOTE — Progress Notes (Signed)
Nutrition Brief Note  Patient identified on the Malnutrition Screening Tool (MST) Report  Met with pt at bedside. Recently returned from an MRI. Nursing staff provided pt with a menu and RD educated on the ordering process.   Pt reports that his appetite is good now and PTA. Denies any difficulty chewing or swallowing at this time. No weight loss reported, limited hx available in chart or care everywhere.  Wt Readings from Last 15 Encounters:  03/01/22 (!) 142.9 kg  02/11/20 129.3 kg   Body mass index is 42.72 kg/m.  Current diet order is Heart Healthy, patient is consuming approximately 100% of meals at this time. Labs and medications reviewed.   No nutrition interventions warranted at this time. If nutrition issues arise, please consult RD.   Ranell Patrick, RD, LDN Clinical Dietitian RD pager # available in [place]  After hours/weekend pager # available in [facility]

## 2022-03-03 NOTE — Progress Notes (Signed)
  Echocardiogram 2D Echocardiogram has been performed.  Wynelle Link 03/03/2022, 11:11 AM

## 2022-03-04 LAB — CBC
HCT: 43.7 % (ref 39.0–52.0)
Hemoglobin: 14.9 g/dL (ref 13.0–17.0)
MCH: 28.5 pg (ref 26.0–34.0)
MCHC: 34.1 g/dL (ref 30.0–36.0)
MCV: 83.7 fL (ref 80.0–100.0)
Platelets: 290 10*3/uL (ref 150–400)
RBC: 5.22 MIL/uL (ref 4.22–5.81)
RDW: 13.3 % (ref 11.5–15.5)
WBC: 10.2 10*3/uL (ref 4.0–10.5)
nRBC: 0 % (ref 0.0–0.2)

## 2022-03-04 LAB — BASIC METABOLIC PANEL
Anion gap: 10 (ref 5–15)
BUN: 14 mg/dL (ref 6–20)
CO2: 25 mmol/L (ref 22–32)
Calcium: 8.8 mg/dL — ABNORMAL LOW (ref 8.9–10.3)
Chloride: 104 mmol/L (ref 98–111)
Creatinine, Ser: 1.03 mg/dL (ref 0.61–1.24)
GFR, Estimated: >60 mL/min (ref >60)
Glucose, Bld: 103 mg/dL — ABNORMAL HIGH (ref 70–99)
Potassium: 3.5 mmol/L (ref 3.5–5.1)
Sodium: 139 mmol/L (ref 135–145)

## 2022-03-04 LAB — MAGNESIUM: Magnesium: 2.1 mg/dL (ref 1.7–2.4)

## 2022-03-04 MED ORDER — POTASSIUM CHLORIDE CRYS ER 20 MEQ PO TBCR
40.0000 meq | EXTENDED_RELEASE_TABLET | Freq: Once | ORAL | Status: AC
  Administered 2022-03-04: 40 meq via ORAL
  Filled 2022-03-04: qty 2

## 2022-03-04 NOTE — H&P (Signed)
Physical Medicine and Rehabilitation Admission H&P    Chief Complaint  Patient presents with   Extremity Weakness  : HPI: Charles Farley is a 51 year old right-handed male with history of hypertension, hyperlipidemia, ADHD, obesity with BMI 42.72, recent COVID positive as well as questionable medical compliance to valsartan as well as Crestor.  Per chart review patient lives with spouse.  Two-level home 3 steps to entry.  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  Wife is a Designer, jewellery.  Presented 03/01/2022 with left-sided numbness, weakness and word finding difficulty.  Blood pressure 172/79.  Cranial CT scan negative, CT angiogram head and neck unremarkable.  Patient did not receive tPA.  Admission chemistries unremarkable except potassium 3.3, glucose 113, cholesterol 223, triglyceride 232, urine drug screen and alcohol negative.  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  Echocardiogram with ejection fraction of 60 to 65% no wall motion abnormalities grade 1 diastolic dysfunction.  Neurology follow-up placed on aspirin 81 mg daily and Plavix 75 mg daily x3 weeks then aspirin alone.  Lovenox added for DVT prophylaxis.  Tolerating a regular diet.  Therapy evaluations completed due to patient's left-sided weakness/word finding difficulties was admitted for a comprehensive rehab program.  Review of Systems  Constitutional:  Negative for chills and fever.  HENT:  Negative for hearing loss.   Eyes:  Negative for blurred vision and double vision.  Respiratory:  Negative for cough and shortness of breath.   Cardiovascular:  Negative for chest pain, palpitations and leg swelling.  Gastrointestinal:  Positive for constipation. Negative for heartburn, nausea and vomiting.  Genitourinary:  Negative for dysuria, flank pain and hematuria.  Musculoskeletal:  Positive for myalgias.  Skin:  Negative for rash.  Neurological:  Positive for sensory change,  speech change and weakness.  All other systems reviewed and are negative.  Past Medical History:  Diagnosis Date   ADHD    Hypertension    No past surgical history on file. No family history on file. Social History:  reports that he has never smoked. He has never used smokeless tobacco. He reports that he does not currently use alcohol. He reports that he does not use drugs. Allergies: No Known Allergies Medications Prior to Admission  Medication Sig Dispense Refill   rosuvastatin (CRESTOR) 40 MG tablet TAKE 1 TABLET BY MOUTH DAILY FOR CHOLESTEROL (Patient taking differently: Take 40 mg by mouth daily.) 90 tablet 3   Valsartan-hydroCHLOROthiazide (DIOVAN HCT PO) Take 1 tablet by mouth daily.     lisdexamfetamine (VYVANSE) 50 MG capsule TAKE 1 CAPSULE BY MOUTH ONCE A DAY (Patient not taking: Reported on 03/03/2022) 30 capsule 0   valsartan (DIOVAN) 320 MG tablet TAKE ONE TABLET BY MOUTH DAILY FOR BLOOD PRESSURE. (Patient not taking: Reported on 03/03/2022) 90 tablet 3      Home: Home Living Family/patient expects to be discharged to:: Private residence Living Arrangements: Spouse/significant other Available Help at Discharge: Family, Available PRN/intermittently (wife is a NP in [place]) Type of Home: House Home Access: Stairs to enter Technical brewer of Steps: 3 Entrance Stairs-Rails: Can reach both Home Layout: Two level Alternate Level Stairs-Number of Steps: flight Alternate Level Stairs-Rails: Right Bathroom Shower/Tub: Multimedia programmer: Associate Professor Accessibility: Yes Home Equipment: None  Lives With: Spouse   Functional History: Prior Function Prior Level of Function : Independent/Modified Independent, Driving, Working/employed Mobility Comments: indep, is a Therapist, 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 ADLs Comments: enjoys reading and watching TV  Functional Status:  Mobility: Bed Mobility Overal bed mobility: Needs Assistance Bed Mobility:  Sit to Supine Rolling: Min guard Sidelying to sit: Min assist Sit to supine: Min guard General bed mobility comments: Pt states it is difficult to lift his L leg onto the bed Transfers Overall transfer level: Needs assistance Equipment used: None Transfers: Sit to/from Stand Sit to Stand: Min guard General transfer comment: minA to power up, pt with L lateral lean Ambulation/Gait Ambulation/Gait assistance: Mod assist Gait Distance (Feet): 60 Feet Assistive device: 1 person hand held assist Gait Pattern/deviations: Step-through pattern, Decreased stride length, Ataxic, Decreased weight shift to right, Decreased dorsiflexion - left, Knees buckling, Drifts right/left General Gait Details: pt with L lateral bias, mild L LE ataxia with noted L knee instability/buckling, modA at L lateral trunk to promote R lateral weight shift, pt with decreased L step height borderline shuffle, pt aware Gait velocity: dec compared to baseline Gait velocity interpretation: <1.8 ft/sec, indicate of risk for recurrent falls    ADL: ADL Overall ADL's : Needs assistance/impaired Grooming: Supervision/safety Upper Body Bathing: Set up, Supervision/ safety, Sitting Lower Body Bathing: Minimal assistance, Sit to/from stand Upper Body Dressing : Minimal assistance Lower Body Dressing: Minimal assistance, Sit to/from stand Toilet Transfer: Minimal assistance, Ambulation Toileting- Clothing Manipulation and Hygiene: Minimal assistance, Sit to/from stand Functional mobility during ADLs: Minimal assistance, Rolling walker (2 wheels) General ADL Comments: feels he does better with use of a RW  Cognition: Cognition Overall Cognitive Status: Within Functional Limits for tasks assessed Arousal/Alertness: Awake/alert Orientation Level: Oriented X4 Year: 2023 Day of Week: Correct Attention: Focused Focused Attention: Appears intact Memory: Appears intact Awareness: Appears intact Problem Solving: Appears  intact Cognition Arousal/Alertness: Awake/alert Behavior During Therapy: WFL for tasks assessed/performed Overall Cognitive Status: Within Functional Limits for tasks assessed General Comments: pt aware of symptoms and deficits, appears intact; will continue to assess  Physical Exam: Blood pressure (!) 149/67, pulse 72, temperature 98 F (36.7 C), temperature source Oral, resp. rate 19, height 6' (1.829 m), weight (!) 142.9 kg, SpO2 96 %. Physical Exam Neurological:     Comments: Patient is alert.  No acute distress.  Makes eye contact with examiner.  Speech is fluent.  Provides name age and date of birth.  Fair insight and awareness.     Results for orders placed or performed during the hospital encounter of 03/01/22 (from the past 48 hour(s))  HIV Antibody (routine testing w rflx)     Status: None   Collection Time: 03/03/22  4:44 AM  Result Value Ref Range   HIV Screen 4th Generation wRfx Non Reactive Non Reactive    Comment: Performed at [facility], [address], [place], [place] [address]  Hemoglobin A1c     Status: None   Collection Time: 03/03/22  4:44 AM  Result Value Ref Range   Hgb A1c MFr Bld 5.1 4.8 - 5.6 %    Comment: (NOTE) Pre diabetes:          5.7%-6.4%  Diabetes:              >6.4%  Glycemic control for   <7.0% adults with diabetes    Mean Plasma Glucose 99.67 mg/dL    Comment: Performed at [facility] [address], [place], [place] [address]  CBC     Status: None   Collection Time: 03/04/22  3:44 AM  Result Value Ref Range   WBC 10.2 4.0 - 10.5 K/uL   RBC  5.22 4.22 - 5.81 MIL/uL   Hemoglobin 14.9 13.0 - 17.0 g/dL   HCT 24.8 25.0 - 03.7 %   MCV 83.7 80.0 - 100.0 fL   MCH 28.5 26.0 - 34.0 pg   MCHC 34.1 30.0 - 36.0 g/dL   RDW 04.8 88.9 - 16.9 %   Platelets 290 150 - 400 K/uL   nRBC 0.0 0.0 - 0.2 %    Comment: Performed at [facility], [address], [place], [place] [address]  Basic metabolic panel     Status: Abnormal    Collection Time: 03/04/22  3:44 AM  Result Value Ref Range   Sodium 139 135 - 145 mmol/L   Potassium 3.5 3.5 - 5.1 mmol/L   Chloride 104 98 - 111 mmol/L   CO2 25 22 - 32 mmol/L   Glucose, Bld 103 (H) 70 - 99 mg/dL    Comment: Glucose reference range applies only to samples taken after fasting for at least 8 hours.   BUN 14 6 - 20 mg/dL   Creatinine, Ser 8.82 0.61 - 1.24 mg/dL   Calcium 8.8 (L) 8.9 - 10.3 mg/dL   GFR, Estimated >80 >03 mL/min    Comment: (NOTE) Calculated using the CKD-EPI Creatinine Equation (2021)    Anion gap 10 5 - 15    Comment: Performed at [facility], [address], [place], [place] [address]  Magnesium     Status: None   Collection Time: 03/04/22  3:44 AM  Result Value Ref Range   Magnesium 2.1 1.7 - 2.4 mg/dL    Comment: Performed at [facility], [address], [place], [place] [address]   ECHOCARDIOGRAM COMPLETE  Result Date: 03/03/2022    ECHOCARDIOGRAM REPORT   Patient Name:   Charles Farley Date of Exam: 03/03/2022 Medical Rec #:  [id number]     Height:       72.0 in Accession #:    [id number]    Weight:       315.0 lb Date of Birth:  1971-01-02     BSA:          2.584 m Patient Age:    51 years      BP:           176/89 mmHg Patient Gender: M             HR:           69 bpm. Exam Location:  Inpatient Procedure: 2D Echo, Cardiac Doppler, Color Doppler and Intracardiac            Opacification Agent Indications:    Stroke I63.9  History:        Patient has no prior history of Echocardiogram examinations.                 Risk Factors:Hypertension, Dyslipidemia and Non-Smoker.  Sonographer:    Aron Baba Referring Phys: [phone] JARED M GARDNER  Sonographer Comments: Technically difficult study due to poor echo windows, no subcostal window and patient is obese. Image acquisition challenging due to respiratory motion. IMPRESSIONS  1. Left ventricular ejection fraction, by estimation, is 60 to 65%. The left ventricle has normal function. The left  ventricle has no regional wall motion abnormalities. There is mild concentric left ventricular hypertrophy. Left ventricular diastolic parameters are consistent with Grade I diastolic dysfunction (impaired relaxation).  2. Right ventricular systolic function is normal. The right ventricular size is normal. Tricuspid regurgitation signal is inadequate  for assessing PA pressure.  3. The mitral valve is normal in structure. Trivial mitral valve regurgitation. No evidence of mitral stenosis.  4. The aortic valve is tricuspid. Aortic valve regurgitation is mild. No aortic stenosis is present.  5. The inferior vena cava is normal in size with greater than 50% respiratory variability, suggesting right atrial pressure of 3 mmHg. Comparison(s): No prior Echocardiogram. Conclusion(s)/Recommendation(s): No intracardiac source of embolism detected on this transthoracic study. Consider a transesophageal echocardiogram to exclude cardiac source of embolism if clinically indicated. FINDINGS  Left Ventricle: Left ventricular ejection fraction, by estimation, is 60 to 65%. The left ventricle has normal function. The left ventricle has no regional wall motion abnormalities. Definity contrast agent was given IV to delineate the left ventricular  endocardial borders. The left ventricular internal cavity size was normal in size. There is mild concentric left ventricular hypertrophy. Left ventricular diastolic parameters are consistent with Grade I diastolic dysfunction (impaired relaxation). Right Ventricle: The right ventricular size is normal. No increase in right ventricular wall thickness. Right ventricular systolic function is normal. Tricuspid regurgitation signal is inadequate for assessing PA pressure. Left Atrium: Left atrial size was normal in size. Right Atrium: Right atrial size was normal in size. Pericardium: There is no evidence of pericardial effusion. Mitral Valve: The mitral valve is normal in structure. Trivial mitral  valve regurgitation. No evidence of mitral valve stenosis. Tricuspid Valve: The tricuspid valve is normal in structure. Tricuspid valve regurgitation is trivial. Aortic Valve: The aortic valve is tricuspid. Aortic valve regurgitation is mild. No aortic stenosis is present. Pulmonic Valve: The pulmonic valve was normal in structure. Pulmonic valve regurgitation is trivial. Aorta: The aortic root and ascending aorta are structurally normal, with no evidence of dilitation. Venous: The inferior vena cava is normal in size with greater than 50% respiratory variability, suggesting right atrial pressure of 3 mmHg. IAS/Shunts: The atrial septum is grossly normal.  LEFT VENTRICLE PLAX 2D LVIDd:         5.10 cm   Diastology LVIDs:         4.00 cm   LV e' medial:    5.26 cm/s LV PW:         1.00 cm   LV E/e' medial:  13.5 LV IVS:        1.00 cm   LV e' lateral:   9.90 cm/s LVOT diam:     2.20 cm   LV E/e' lateral: 7.2 LV SV:         81 LV SV Index:   31 LVOT Area:     3.80 cm  RIGHT VENTRICLE RV S prime:     19.60 cm/s TAPSE (M-mode): 2.6 cm LEFT ATRIUM             Index        RIGHT ATRIUM           Index LA diam:        3.80 cm 1.47 cm/m   RA Area:     17.20 cm LA Vol (A2C):   42.9 ml 16.60 ml/m  RA Volume:   44.60 ml  17.26 ml/m LA Vol (A4C):   46.2 ml 17.88 ml/m LA Biplane Vol: 45.1 ml 17.45 ml/m  AORTIC VALVE LVOT Vmax:   111.00 cm/s LVOT Vmean:  71.800 cm/s LVOT VTI:    0.212 m  AORTA Ao Root diam: 3.60 cm Ao Asc diam:  3.40 cm MITRAL VALVE MV Area (PHT): 2.87 cm    SHUNTS  MV Decel Time: 264 msec    Systemic VTI:  0.21 m MV E velocity: 71.00 cm/s  Systemic Diam: 2.20 cm MV A velocity: 97.80 cm/s MV E/A ratio:  0.73 Gwyndolyn Kaufman MD Electronically signed by Gwyndolyn Kaufman MD Signature Date/Time: 03/03/2022/11:44:33 AM    Final    MR BRAIN WO CONTRAST  Result Date: 03/03/2022 CLINICAL DATA:  Stroke follow-up. EXAM: MRI HEAD WITHOUT CONTRAST TECHNIQUE: Multiplanar, multiecho pulse sequences of the brain  and surrounding structures were obtained without intravenous contrast. COMPARISON:  CT head 03/01/22 FINDINGS: Brain: Acute infarct in the posterior limb of the right internal capsule (series 2, image 22). No evidence of hemorrhage. No extra-axial fluid collection. There is sequela of moderate chronic microvascular ischemic change. No hydrocephalus. Vascular: Normal flow voids. Skull and upper cervical spine: Normal marrow signal. Sinuses/Orbits: Bilateral maxillary sinus mucosal thickening. Other: None. IMPRESSION: Acute infarct in the posterior limb of the right internal capsule. No hemorrhage. Electronically Signed   By: Marin Roberts M.D.   On: 03/03/2022 08:31      Blood pressure (!) 149/67, pulse 72, temperature 98 F (36.7 C), temperature source Oral, resp. rate 19, height 6' (1.829 m), weight (!) 142.9 kg, SpO2 96 %.  Medical Problem List and Plan: 1. 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  -patient may *** shower  -ELOS/Goals: *** 2.  Antithrombotics: -DVT/anticoagulation:  Pharmaceutical: Lovenox  -antiplatelet therapy: Aspirin 81 mg daily and Plavix 75 mg daily x3 weeks then aspirin alone 3. Pain Management: Tylenol as needed 4. Mood/Behavior/Sleep: Provide emotional support  -antipsychotic agents: N/A 5. Neuropsych/cognition: This patient is capable of making decisions on his own behalf. 6. Skin/Wound Care: Routine skin checks 7. Fluids/Electrolytes/Nutrition: Routine in and outs with follow-up chemistries 8.  Permissive hypertension.  Presently on no antihypertensive medications.  Patient on Diovan 160 mg daily daily, HCTZ 12.5 mg daily prior to admission.  Resume as needed 9.  Hyperlipidemia.  Crestor 10.  ADHD.  Resume Vyvanse on discharge 11.  Obesity.  BMI 42.72.  Dietary follow-up 12.  Recent COVID-19 infection.  Patient asymptomatic.     Lavon Paganini Genni Buske, PA-C 03/04/2022

## 2022-03-04 NOTE — Discharge Summary (Signed)
[facility]  Physician Discharge Summary   Patient ID: Charles Farley MRN: [id number] DOB/AGE: 07/18/1970 51 y.o.  Admit date: 03/01/2022 Discharge date:   03/04/2022   PCP: Charles Bosch, NP  DISCHARGE DIAGNOSES:  Principal Problem:   Stroke-like symptoms Active Problems:   HTN (hypertension)   HLD (hyperlipidemia)   Acute CVA (cerebrovascular accident) (HCC)   RECOMMENDATIONS FOR OUTPATIENT FOLLOW UP: Patient being discharged to [facility].  CODE STATUS: Full code  DISCHARGE CONDITION: fair  Diet recommendation: Heart healthy  INITIAL HISTORY: 51 year old Caucasian male with a past medical history of essential hypertension though he is noncompliant with his medications, obesity, hyperlipidemia presented with left arm weakness.  Also complained of heaviness of the left lower extremity.  Patient tested positive for COVID-19 11 days prior to admission.  Does not have any respiratory symptoms currently.  Hospitalized for further management.   Consultants: Neurology.  Seen by telemetry neurology at [facility].     Procedures: Transthoracic echocardiogram   HOSPITAL COURSE:   Left-sided weakness Patient had strokelike symptoms.  Patient was seen by telemetry neurology at [facility].  CT angiogram head and neck did not show any significant stenosis. MRI brain revealed an acute stroke LDL is 142.  HbA1c 5.1.  HIV nonreactive. Patient on rosuvastatin. Echocardiogram shows normal left ventricular systolic function with grade 1 diastolic dysfunction. Aspirin and Plavix recommended by neurology for 3 weeks followed by aspirin alone. Please review neurology notes for further details. Seen by physical therapy and inpatient rehabilitation is recommended.   Hyperlipidemia Noted to be on rosuvastatin.   Essential hypertension Currently allowing permissive hypertension. May not have been compliant with his antihypertensives.   Consider starting  antihypertensives after he is over his acute phase of stroke.   Hypokalemia Repleted.  Will give additional dose today.  Magnesium was 2.1.   Obesity Estimated body mass index is 42.72 kg/m as calculated from the following:   Height as of this encounter: 6' (1.829 m).   Weight as of this encounter: 142.9 kg.    Okay for discharge to CIR whenever bed is available.  PERTINENT LABS:  The results of significant diagnostics from this hospitalization (including imaging, microbiology, ancillary and laboratory) are listed below for reference.     Labs:   Basic Metabolic Panel: Recent Labs  Lab 03/01/22 2013 03/04/22 0344  NA 140 139  K 3.3* 3.5  CL 106 104  CO2 27 25  GLUCOSE 113* 103*  BUN 16 14  CREATININE 0.98 1.03  CALCIUM 8.4* 8.8*  MG  --  2.1   Liver Function Tests: Recent Labs  Lab 03/01/22 2013  AST 18  ALT 23  ALKPHOS 52  BILITOT 0.6  PROT 7.0  ALBUMIN 3.9    CBC: Recent Labs  Lab 03/01/22 2013 03/04/22 0344  WBC 9.2 10.2  NEUTROABS 5.6  --   HGB 14.6 14.9  HCT 42.1 43.7  MCV 83.0 83.7  PLT 297 290    IMAGING STUDIES ECHOCARDIOGRAM COMPLETE  Result Date: 03/03/2022    ECHOCARDIOGRAM REPORT   Patient Name:   Charles Farley Date of Exam: 03/03/2022 Medical Rec #:  [id number]     Height:       72.0 in Accession #:    [id number]    Weight:       315.0 lb Date of Birth:  1970/11/27     BSA:          2.584 m Patient Age:  51 years      BP:           176/89 mmHg Patient Gender: M             HR:           69 bpm. Exam Location:  Inpatient Procedure: 2D Echo, Cardiac Doppler, Color Doppler and Intracardiac            Opacification Agent Indications:    Stroke I63.9  History:        Patient has no prior history of Echocardiogram examinations.                 Risk Factors:Hypertension, Dyslipidemia and Non-Smoker.  Sonographer:    Greer Pickerel Referring Phys: [phone] JARED M GARDNER  Sonographer Comments: Technically difficult study due to poor echo windows, no  subcostal window and patient is obese. Image acquisition challenging due to respiratory motion. IMPRESSIONS  1. Left ventricular ejection fraction, by estimation, is 60 to 65%. The left ventricle has normal function. The left ventricle has no regional wall motion abnormalities. There is mild concentric left ventricular hypertrophy. Left ventricular diastolic parameters are consistent with Grade I diastolic dysfunction (impaired relaxation).  2. Right ventricular systolic function is normal. The right ventricular size is normal. Tricuspid regurgitation signal is inadequate for assessing PA pressure.  3. The mitral valve is normal in structure. Trivial mitral valve regurgitation. No evidence of mitral stenosis.  4. The aortic valve is tricuspid. Aortic valve regurgitation is mild. No aortic stenosis is present.  5. The inferior vena cava is normal in size with greater than 50% respiratory variability, suggesting right atrial pressure of 3 mmHg. Comparison(s): No prior Echocardiogram. Conclusion(s)/Recommendation(s): No intracardiac source of embolism detected on this transthoracic study. Consider a transesophageal echocardiogram to exclude cardiac source of embolism if clinically indicated. FINDINGS  Left Ventricle: Left ventricular ejection fraction, by estimation, is 60 to 65%. The left ventricle has normal function. The left ventricle has no regional wall motion abnormalities. Definity contrast agent was given IV to delineate the left ventricular  endocardial borders. The left ventricular internal cavity size was normal in size. There is mild concentric left ventricular hypertrophy. Left ventricular diastolic parameters are consistent with Grade I diastolic dysfunction (impaired relaxation). Right Ventricle: The right ventricular size is normal. No increase in right ventricular wall thickness. Right ventricular systolic function is normal. Tricuspid regurgitation signal is inadequate for assessing PA pressure. Left  Atrium: Left atrial size was normal in size. Right Atrium: Right atrial size was normal in size. Pericardium: There is no evidence of pericardial effusion. Mitral Valve: The mitral valve is normal in structure. Trivial mitral valve regurgitation. No evidence of mitral valve stenosis. Tricuspid Valve: The tricuspid valve is normal in structure. Tricuspid valve regurgitation is trivial. Aortic Valve: The aortic valve is tricuspid. Aortic valve regurgitation is mild. No aortic stenosis is present. Pulmonic Valve: The pulmonic valve was normal in structure. Pulmonic valve regurgitation is trivial. Aorta: The aortic root and ascending aorta are structurally normal, with no evidence of dilitation. Venous: The inferior vena cava is normal in size with greater than 50% respiratory variability, suggesting right atrial pressure of 3 mmHg. IAS/Shunts: The atrial septum is grossly normal.  LEFT VENTRICLE PLAX 2D LVIDd:         5.10 cm   Diastology LVIDs:         4.00 cm   LV e' medial:    5.26 cm/s LV PW:  1.00 cm   LV E/e' medial:  13.5 LV IVS:        1.00 cm   LV e' lateral:   9.90 cm/s LVOT diam:     2.20 cm   LV E/e' lateral: 7.2 LV SV:         81 LV SV Index:   31 LVOT Area:     3.80 cm  RIGHT VENTRICLE RV S prime:     19.60 cm/s TAPSE (M-mode): 2.6 cm LEFT ATRIUM             Index        RIGHT ATRIUM           Index LA diam:        3.80 cm 1.47 cm/m   RA Area:     17.20 cm LA Vol (A2C):   42.9 ml 16.60 ml/m  RA Volume:   44.60 ml  17.26 ml/m LA Vol (A4C):   46.2 ml 17.88 ml/m LA Biplane Vol: 45.1 ml 17.45 ml/m  AORTIC VALVE LVOT Vmax:   111.00 cm/s LVOT Vmean:  71.800 cm/s LVOT VTI:    0.212 m  AORTA Ao Root diam: 3.60 cm Ao Asc diam:  3.40 cm MITRAL VALVE MV Area (PHT): 2.87 cm    SHUNTS MV Decel Time: 264 msec    Systemic VTI:  0.21 m MV E velocity: 71.00 cm/s  Systemic Diam: 2.20 cm MV A velocity: 97.80 cm/s MV E/A ratio:  0.73 Laurance Flatten MD Electronically signed by Laurance Flatten MD Signature  Date/Time: 03/03/2022/11:44:33 AM    Final    MR BRAIN WO CONTRAST  Result Date: 03/03/2022 CLINICAL DATA:  Stroke follow-up. EXAM: MRI HEAD WITHOUT CONTRAST TECHNIQUE: Multiplanar, multiecho pulse sequences of the brain and surrounding structures were obtained without intravenous contrast. COMPARISON:  CT head 03/01/22 FINDINGS: Brain: Acute infarct in the posterior limb of the right internal capsule (series 2, image 22). No evidence of hemorrhage. No extra-axial fluid collection. There is sequela of moderate chronic microvascular ischemic change. No hydrocephalus. Vascular: Normal flow voids. Skull and upper cervical spine: Normal marrow signal. Sinuses/Orbits: Bilateral maxillary sinus mucosal thickening. Other: None. IMPRESSION: Acute infarct in the posterior limb of the right internal capsule. No hemorrhage. Electronically Signed   By: Lorenza Cambridge M.D.   On: 03/03/2022 08:31   CT ANGIO HEAD NECK W WO CM  Result Date: 03/01/2022 : Ordering Physician: Jacky Kindle, MD. At time of dictation, Deatra Robinson is erroneously listed in the exam header as the ordering physician. The ordering provider information is correct in Epic. CLINICAL DATA:  Left-sided weakness with expressive aphasia EXAM: CT ANGIOGRAPHY HEAD AND NECK TECHNIQUE: Multidetector CT imaging of the head and neck was performed using the standard protocol during bolus administration of intravenous contrast. Multiplanar CT image reconstructions and MIPs were obtained to evaluate the vascular anatomy. Carotid stenosis measurements (when applicable) are obtained utilizing NASCET criteria, using the distal internal carotid diameter as the denominator. RADIATION DOSE REDUCTION: This exam was performed according to the departmental dose-optimization program which includes automated exposure control, adjustment of the mA and/or kV according to patient size and/or use of iterative reconstruction technique. CONTRAST:  53mL OMNIPAQUE IOHEXOL 350 MG/ML  SOLN COMPARISON:  None Available. FINDINGS: CTA NECK FINDINGS SKELETON: There is no bony spinal canal stenosis. No lytic or blastic lesion. OTHER NECK: Normal pharynx, larynx and major salivary glands. No cervical lymphadenopathy. Unremarkable thyroid gland. UPPER CHEST: No pneumothorax or pleural effusion. No nodules or masses.  AORTIC ARCH: There is no calcific atherosclerosis of the aortic arch. There is no aneurysm, dissection or hemodynamically significant stenosis of the visualized portion of the aorta. Conventional 3 vessel aortic branching pattern. The visualized proximal subclavian arteries are widely patent. RIGHT CAROTID SYSTEM: Normal without aneurysm, dissection or stenosis. LEFT CAROTID SYSTEM: Normal without aneurysm, dissection or stenosis. VERTEBRAL ARTERIES: Right dominant configuration. Both origins are clearly patent. There is no dissection, occlusion or flow-limiting stenosis to the skull base (V1-V3 segments). CTA HEAD FINDINGS POSTERIOR CIRCULATION: --Vertebral arteries: Normal V4 segments. --Inferior cerebellar arteries: Normal. --Basilar artery: Normal. --Superior cerebellar arteries: Normal. --Posterior cerebral arteries (PCA): Normal. ANTERIOR CIRCULATION: --Intracranial internal carotid arteries: Normal. --Anterior cerebral arteries (ACA): Normal. Both A1 segments are present. Patent anterior communicating artery (a-comm). --Middle cerebral arteries (MCA): Normal. VENOUS SINUSES: As permitted by contrast timing, patent. ANATOMIC VARIANTS: None Review of the MIP images confirms the above findings. IMPRESSION: Normal CTA of the head and neck Electronically Signed   By: Deatra Robinson M.D.   On: 03/01/2022 23:04   CT HEAD WO CONTRAST  Result Date: 03/01/2022 CLINICAL DATA:  Neuro deficit, acute stroke suspected. Last known well noon today. Heaviness in the left arm and leg. EXAM: CT HEAD WITHOUT CONTRAST TECHNIQUE: Contiguous axial images were obtained from the base of the skull through  the vertex without intravenous contrast. RADIATION DOSE REDUCTION: This exam was performed according to the departmental dose-optimization program which includes automated exposure control, adjustment of the mA and/or kV according to patient size and/or use of iterative reconstruction technique. COMPARISON:  None Available. FINDINGS: Brain: No evidence of acute infarction, hemorrhage, hydrocephalus, extra-axial collection or mass lesion/mass effect. Vascular: No hyperdense vessel or unexpected calcification. Skull: Normal. Negative for fracture or focal lesion. Sinuses/Orbits: No acute finding. Other: None. IMPRESSION: No acute intracranial pathology. Aspects score of 10. Electronically Signed   By: Larose Hires D.O.   On: 03/01/2022 20:35    DISCHARGE EXAMINATION: See progress note from earlier today  DISPOSITION: CIR  Discharge Instructions     Ambulatory referral to Neurology   Complete by: As directed    Follow up with Dr. Vickey Huger at [facility] in 2 weeks for stroke follow-up and OSA evaluation. Thanks.        Current Inpatient Medications: Scheduled:   stroke: early stages of recovery book   Does not apply Once   aspirin EC  81 mg Oral Daily   clopidogrel  75 mg Oral Daily   enoxaparin (LOVENOX) injection  40 mg Subcutaneous Q24H   rosuvastatin  40 mg Oral Daily   Continuous: [id number] **OR** acetaminophen (TYLENOL) oral liquid 160 mg/5 mL **OR** acetaminophen, LORazepam      Follow-up Information     Dohmeier, Porfirio Mylar, MD. Schedule an appointment as soon as possible for a visit in 2 week(s).   Specialty: Neurology Contact information: [address] [place] [address] [phone]         Charles Bosch, NP Follow up.   Specialty: Nurse Practitioner Contact information: [address] [place] [address] [phone]                 TOTAL DISCHARGE TIME: 35 mins  Haseeb Fiallos Rito Ehrlich  [facility] Pager on  [email or web address]  03/04/2022, 2:17 PM

## 2022-03-04 NOTE — TOC Progression Note (Signed)
Transition of Care [facility]) - Progression Note    Patient Details  Name: Charles Farley MRN: [id number] Date of Birth: [birth year]  [facility] CM/SW Contact  Pollie Friar, RN Phone Number: 03/04/2022, 12:41 PM  Clinical Narrative:    CIR has begun insurance auth for rehab. If approved pt will transport to CIR via wheelchair/ bed.  TOC following.   Expected Discharge Plan: IP Rehab Facility Barriers to Discharge: Continued Medical Work up  Expected Discharge Plan and Services Expected Discharge Plan: [place]   Discharge Planning Services: CM Consult Post Acute Care Choice: IP Rehab Living arrangements for the past 2 months: Single Family Home                                       Social Determinants of Health (SDOH) Interventions    Readmission Risk Interventions     No data to display

## 2022-03-04 NOTE — Progress Notes (Signed)
Mobility Specialist: Progress Note   03/04/22 1626  Mobility  Activity Ambulated with assistance in hallway  Level of Assistance Contact guard assist, steadying assist  Assistive Device Front wheel walker  Distance Ambulated (ft) 150 ft  Activity Response Tolerated well  Mobility Referral Yes  $Mobility charge 1 Mobility   Pt received in the bed and agreeable to mobility. Mod I with bed mobility and contact guard during ambulation. Stopped x1 d/t postural imbalance, no overt LOB. Pt to the chair after session with call bell and phone in reach.   [place] Mobility Specialist Secure Chat Only

## 2022-03-04 NOTE — Progress Notes (Signed)
Inpatient Rehab Admissions Coordinator:    Case is still pending with insurance. I updated patient. Will continue to follow.   Rehab Admissons Coordinator [place], [place], [place] [phone]

## 2022-03-04 NOTE — Progress Notes (Signed)
Physical Therapy Treatment Patient Details Name: Charles Farley MRN: [id number] DOB: May 09, 1971 Today's Date: 03/04/2022   History of Present Illness Charles Farley is a 51 y.o. male who presented to ED with c/o L UE/LE weakness and heaviness sensation with some expressive aphasia. PMH: HTN, obesity, had COVID 11 days ago    PT Comments    Pt progressing well towards all goals. Pt remains to demo impaired L UE/LE coordination, decreased L UE/LE strength, impaired balance, and requires RW for safe ambulation at this time. 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 Pt to greatly benefit from AIR upon d/c for aggressive rehab program to achieve safe indep function. Pt demonstrates excellent rehab potential and suspect can achieve safe mod I level of function by end of AIR stay. Acute PT to cont to follow.    Recommendations for follow up therapy are one component of a multi-disciplinary discharge planning process, led by the attending physician.  Recommendations may be updated based on patient status, additional functional criteria and insurance authorization.  Follow Up Recommendations  Acute inpatient rehab (3hours/day)     Assistance Recommended at Discharge Frequent or constant Supervision/Assistance  Patient can return home with the following A little help with bathing/dressing/bathroom;Assistance with cooking/housework;Assist for transportation;Help with stairs or ramp for entrance;A little help with walking and/or transfers   Equipment Recommendations   (TBD at next venue)    Recommendations for Other Services Rehab consult     Precautions / Restrictions Precautions Precautions: Fall Precaution Comments: L LE lean; weakness Restrictions Weight Bearing Restrictions: No     Mobility  Bed Mobility Overal bed mobility: Modified Independent Bed Mobility: Sidelying to Sit, Rolling Rolling: Supervision Sidelying to sit: Supervision       General  bed mobility comments: HOB elevated, body habitus proves to challenge ability to transfer to EOB    Transfers Overall transfer level: Needs assistance Equipment used: Rolling walker (2 wheels) Transfers: Sit to/from Stand Sit to Stand: Min guard           General transfer comment: verbal cues for safe hand placement, uses momentum to power up    Ambulation/Gait Ambulation/Gait assistance: Min assist, Mod assist Gait Distance (Feet): 150 Feet (x1 with RW, 80x1 without AD/HHA) Assistive device: Rolling walker (2 wheels), 1 person hand held assist Gait Pattern/deviations: Step-through pattern, Knees buckling, Ataxic, Drifts right/left, Decreased dorsiflexion - left, Decreased stance time - right Gait velocity: Increased gait velocity with RW compared to HHA     General Gait Details: pt amb with RW and with L HHA. with RW pt as able to demo sequential/fluid gait pattern with minimal L knee buckling however progressive UE dependence with onset of fatigue. Pt vearing to the L with RW as well requiring verbal cues to amb straight. pt then ambulated with RW and L HHA. Pt with decreased L LE step length and height as well as L knee buckling, pt with quicker onset of fatigue and required progressive assist up to [employer]    Modified Rankin (Stroke Patients Only) Modified Rankin (Stroke Patients Only) Pre-Morbid Rankin Score: No symptoms Modified Rankin: Moderately severe disability     Balance Overall balance assessment: Needs assistance Sitting-balance support: Feet supported, No upper extremity supported Sitting balance-Leahy Scale: Good Sitting balance - Comments: pt able to adjust socks while at EOB without LOB   Standing  balance support: During functional activity, Reliant on assistive device for balance Standing balance-Leahy Scale: Poor Standing balance comment: pt reliant on RW for support during standing balance task                             Cognition Arousal/Alertness: Awake/alert Behavior During Therapy: WFL for tasks assessed/performed Overall Cognitive Status: Within Functional Limits for tasks assessed                                          Exercises General Exercises - Lower Extremity Long Arc Quad: AROM, Left, Seated, 5 reps (with 5 sec hold at top and slow/controlled descent. Pt educated for HEP; pt retained information) Other Exercises Other Exercises: Sit to Stand with encourgaged weight shift to LLE 5x5-8sec (pt asked to toe touch with RLE to minimize use of uninvolved side in addition to not use R UE to assist with sit to stand) Other Exercises: Single LLE stance (RW was used; pt asked to weight bear on LLE with griping of RW for 4x10sec. Pt then instructed to release grip for 1x5sec for increase challenge, pt with noted increased difficulty)    General Comments General comments (skin integrity, edema, etc.): VSS      Pertinent Vitals/Pain Pain Assessment Pain Assessment: No/denies pain    Home Living   Living Arrangements: Spouse/significant other Available Help at Discharge: Family;Available PRN/intermittently Type of Home: House Home Access: Stairs to enter Entrance Stairs-Rails: Can reach both Entrance Stairs-Number of Steps: 3 Alternate Level Stairs-Number of Steps: flight Home Layout: Two level Home Equipment: None      Prior Function            PT Goals (current goals can now be found in the care plan section) Acute Rehab PT Goals Patient Stated Goal: get better quickly PT Goal Formulation: With patient Time For Goal Achievement: 03/17/22 Potential to Achieve Goals: Good Progress towards PT goals: Progressing toward goals    Frequency    Min 4X/week      PT Plan Current plan remains appropriate    Co-evaluation              AM-PAC PT "6 Clicks" Mobility   Outcome Measure  Help needed turning from your back to your side  while in a flat bed without using bedrails?: A Little Help needed moving from lying on your back to sitting on the side of a flat bed without using bedrails?: A Little Help needed moving to and from a bed to a chair (including a wheelchair)?: A Little Help needed standing up from a chair using your arms (e.g., wheelchair or bedside chair)?: A Lot Help needed to walk in hospital room?: A Lot Help needed climbing 3-5 steps with a railing? : A Lot 6 Click Score: 15    End of Session Equipment Utilized During Treatment: Gait belt Activity Tolerance: Patient tolerated treatment well Patient left: in chair;with call bell/phone within reach;with chair alarm set Nurse Communication: Mobility status PT Visit Diagnosis: Unsteadiness on feet (R26.81);Muscle weakness (generalized) (M62.81);Difficulty in walking, not elsewhere classified (R26.2)     Time: 4008-6761 PT Time Calculation (min) (ACUTE ONLY): 44 min  Charges:  $Gait Training: 8-22 mins $Therapeutic Exercise: 8-22 mins $Neuromuscular Re-education: 8-22 mins  Lewis Shock, PT, DPT Acute Rehabilitation Services Secure chat preferred Office #: [phone]    Iona Hansen 03/04/2022, 1:18 PM

## 2022-03-04 NOTE — PMR Pre-admission (Signed)
PMR Admission Coordinator Pre-Admission Assessment  Patient: Charles Farley is an 51 y.o., male MRN: [id number] DOB: 11/29/1970 Height: 6' (182.9 cm) Weight: (!) 142.9 kg  Insurance Information HMO:     PPO: yes     PCP:      IPA:      80/20:      OTHER:  PRIMARYJanit Bern      Policy#: [id number], group # [id number]      Subscriber: patient CM Name: Lattie Haw      Phone#: [phone]    Fax#: [phone] Pre-Cert#: [id number] approved for 7 days with update due 03/12/22     Employer: [employer] Benefits:  Phone #: [phone]     Name: Verified Eff. Date: 09/16/2017     Deduct: $350 ( $43met)      Out of Pocket Max: $7,900 ($20 met)       CIR: 80% coverage, 20% co-insurance      SNF: 80% coverage, 20% co-insurance Outpatient: $20 copay/visit after deductable      Home Health: 80% coverage, 20% co-insurance       DME: 80% coverage, 20% co-insurance      Providers: patient choice  The "Data Collection Information Summary" for patients in Inpatient Rehabilitation Facilities with attached "Privacy Act [place] Records" was provided and verbally reviewed with: N/A  Emergency Contact Information Contact Information     Name Relation Home Work Mobile   Prajapati,ERICA Spouse [phone]         Current Medical History  Patient Admitting Diagnosis: R CVA  History of Present Illness: Charles Farley is a 51 year old right-handed male with history of hypertension, hyperlipidemia, ADHD, obesity with BMI 42.72, recent COVID positive as well as questionable medical compliance to valsartan as well as Crestor.  Per chart review patient lives with spouse.  Two-level home 3 steps to entry.  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  Wife is a Designer, jewellery. Presented 03/01/2022 with left-sided numbness, weakness and word finding difficulty.  Blood pressure 172/79.  Cranial CT scan negative, CT angiogram head and neck unremarkable.  Patient did not receive  tPA.  Admission chemistries unremarkable except potassium 3.3, glucose 113, cholesterol 223, triglyceride 232, urine drug screen and alcohol negative.  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  Echocardiogram with ejection fraction of 60 to 65% no wall motion abnormalities grade 1 diastolic dysfunction.  Neurology follow-up placed on aspirin 81 mg daily and Plavix 75 mg daily x3 weeks then aspirin alone.  Lovenox added for DVT prophylaxis.  Tolerating a regular diet.  Therapy evaluations completed due to patient's left-sided weakness/word finding difficulties.  Patient to be admitted for a comprehensive inpatient rehab program.  Complete NIHSS TOTAL: 0  [facility]'s [facility] record [facility] has been reviewed by the rehabilitation admission coordinator and physician.  Past Medical History  Past Medical History:  Diagnosis Date   ADHD    Hypertension     Has the patient had major surgery during 100 days prior to admission? No  Family History   family history is not on file.  Current Medications  Current Facility-Administered Medications:     stroke: early stages of recovery book, , Does not apply, Once, Alcario Drought, Jared M, DO   acetaminophen (TYLENOL) tablet 650 mg, 650 mg, Oral, Q4H PRN **OR** acetaminophen (TYLENOL) 160 MG/5ML solution 650 mg, 650 mg, Per Tube, Q4H PRN **OR** acetaminophen (TYLENOL) suppository 650 mg, 650 mg, Rectal, Q4H PRN,  Etta Quill, DO   aspirin EC tablet 81 mg, 81 mg, Oral, Daily, Etta Quill, DO, 81 mg at 03/05/22 0915   clopidogrel (PLAVIX) tablet 75 mg, 75 mg, Oral, Daily, Shafer, Devon, NP, 75 mg at 03/05/22 0915   enoxaparin (LOVENOX) injection 40 mg, 40 mg, Subcutaneous, Q24H, Alcario Drought, Jared M, DO, 40 mg at 03/04/22 1319   LORazepam (ATIVAN) injection 1 mg, 1 mg, Intravenous, Once PRN, Alcario Drought, Jared M, DO   rosuvastatin (CRESTOR) tablet 40 mg, 40 mg, Oral, Daily, Rosalin Hawking, MD, 40 mg at 03/05/22 0915  Patients  Current Diet:  Diet Order             Diet - low sodium heart healthy           Diet Heart Room service appropriate? Yes; Fluid consistency: Thin  Diet effective now                   Precautions / Restrictions Precautions Precautions: Fall Precaution Comments: L LE lean; weakness Restrictions Weight Bearing Restrictions: No   Has the patient had 2 or more falls or a fall with injury in the past year? No  Prior Activity Level Community (5-7x/wk): Independent, working full time  Prior Functional Level Self Care: Did the patient need help bathing, dressing, using the toilet or eating? Independent  Indoor Mobility: Did the patient need assistance with walking from room to room (with or without device)? Independent  Stairs: Did the patient need assistance with internal or external stairs (with or without device)? Independent  Functional Cognition: Did the patient need help planning regular tasks such as shopping or remembering to take medications? Independent  Patient Information Are you of Hispanic, Latino/a,or Spanish origin?: A. No, not of Hispanic, Latino/a, or Spanish origin What is your race?: A. White Do you need or want an interpreter to communicate with a doctor or health care staff?: 0. No  Patient's Response To:  Health Literacy and Transportation Is the patient able to respond to health literacy and transportation needs?: Yes Health Literacy - How often do you need to have someone help you when you read instructions, pamphlets, or other written material from your doctor or pharmacy?: Never In the past 12 months, has lack of transportation kept you from medical appointments or from getting medications?: No In the past 12 months, has lack of transportation kept you from meetings, work, or from getting things needed for daily living?: No  Development worker, international aid / [place] Devices/Equipment: None Home Equipment: None  Prior Device Use: Indicate  devices/aids used by the patient prior to current illness, exacerbation or injury? None of the above  Current Functional Level Cognition  Arousal/Alertness: Awake/alert Overall Cognitive Status: Within Functional Limits for tasks assessed Orientation Level: Oriented X4 General Comments: pt responsive to all questions asked and aware of deficits. Pt able to verbally describe what he has been working on in therapy and demonstrate good awareness. Recognizing OT moving on his L side in peripheral vision. Asking meaningful and insightful questions throughout Attention: Focused Focused Attention: Appears intact Memory: Appears intact Awareness: Appears intact Problem Solving: Appears intact    Extremity Assessment (includes Sensation/Coordination)  Upper Extremity Assessment: LUE deficits/detail LUE Deficits / Details: AROM overall WFL. minimally weaker than R dominant UE; attmepting to use for functional tasks; fine motor/in-hand manipulation skill deficits - slightly incoordinated but pt reporting has improved since eval. LUE Sensation: decreased proprioception (decr finger to nose) LUE Coordination: decreased fine motor  Lower Extremity Assessment: Defer to PT evaluation LLE Deficits / Details: mild weakness, noted mild ataxia with ambulation, pt reports feeling super weak LLE Sensation:  (feeling of heaviness) LLE Coordination: decreased gross motor    ADLs  Overall ADL's : Needs assistance/impaired Grooming: Oral care, Standing, Min guard Grooming Details (indicate cue type and reason): FM coordination deficits noted. Placing cap on toothpaste with LUE, however, twisting tube of toothpaste to don cap rather than twisting cap onto tooth paste Upper Body Bathing: Set up, Supervision/ safety, Sitting Lower Body Bathing: Minimal assistance, Sit to/from stand Upper Body Dressing : Minimal assistance Lower Body Dressing: Min guard, Sit to/from stand Lower Body Dressing Details (indicate cue  type and reason): Min gaurd A to don socks sitting EOB. Pt lifting LLE with BUE to achieve figure 4 position Toilet Transfer: Min guard, Minimal assistance, Ambulation, Rolling walker (2 wheels) Toilet Transfer Details (indicate cue type and reason): Overall min guard A. Simulated in room. Occasionally minimally off balance, and pt requiring up to min A. Toileting- Clothing Manipulation and Hygiene: Minimal assistance, Sit to/from stand Functional mobility during ADLs: Min guard, Rolling walker (2 wheels) General ADL Comments: Min guard A for RW. Limited by LUE coordination. Performing standing task of eracing dots drawn on mirror with L index finger to address undershooting/slight dysmetria. Pt removing 1/2 of dot/circle on first attempt with additional repetitions and increased time.    Mobility  Overal bed mobility: Modified Independent Bed Mobility: Sidelying to Sit, Rolling Rolling: Supervision Sidelying to sit: Supervision Sit to supine: Min guard General bed mobility comments: HOB elevated, body habitus proves to challenge ability to transfer to EOB    Transfers  Overall transfer level: Needs assistance Equipment used: Rolling walker (2 wheels) Transfers: Sit to/from Stand Sit to Stand: Min guard General transfer comment: Uses momentum to power up. Min cues for safe hand placement    Ambulation / Gait / Stairs / Wheelchair Mobility  Ambulation/Gait Ambulation/Gait assistance: Min assist, Mod assist Gait Distance (Feet): 150 Feet (x1 with RW, 80x1 without AD/HHA) Assistive device: Rolling walker (2 wheels), 1 person hand held assist Gait Pattern/deviations: Step-through pattern, Knees buckling, Ataxic, Drifts right/left, Decreased dorsiflexion - left, Decreased stance time - right General Gait Details: pt amb with RW and with L HHA. with RW pt as able to demo sequential/fluid gait pattern with minimal L knee buckling however progressive UE dependence with onset of fatigue. Pt  vearing to the L with RW as well requiring verbal cues to amb straight. pt then ambulated with RW and L HHA. Pt with decreased L LE step length and height as well as L knee buckling, pt with quicker onset of fatigue and required progressive assist up to modA Gait velocity: Increased gait velocity with RW compared to HHA Gait velocity interpretation: <1.8 ft/sec, indicate of risk for recurrent falls    Posture / Balance Dynamic Sitting Balance Sitting balance - Comments: pt able to adjust socks while at EOB without LOB, however, min guard A for safety with achievement of figure 4 positin Balance Overall balance assessment: Needs assistance Sitting-balance support: Feet supported, No upper extremity supported Sitting balance-Leahy Scale: Good Sitting balance - Comments: pt able to adjust socks while at EOB without LOB, however, min guard A for safety with achievement of figure 4 positin Postural control: Left lateral lean Standing balance support: During functional activity, Reliant on assistive device for balance Standing balance-Leahy Scale: Poor Standing balance comment: pt reliant on RW for support during standing balance  task    Special needs/care consideration Skin intact   Previous Home Environment (from acute therapy documentation) Living Arrangements: Spouse/significant other  Lives With: Spouse, Son Available Help at Discharge: Family, Available PRN/intermittently Type of Home: House Home Layout: Two level Alternate Level Stairs-Rails: Right Alternate Level Stairs-Number of Steps: flight Home Access: Stairs to enter Entrance Stairs-Rails: Can reach both Entrance Stairs-Number of Steps: 3 Bathroom Shower/Tub: Multimedia programmer: Standard Bathroom Accessibility: Yes How Accessible: Accessible via walker Erma: No  Discharge Living Setting Plans for Discharge Living Setting: Patient's home, Lives with (comment) (spouse children) Type of Home at  Discharge: House Discharge Home Layout: Two level Alternate Level Stairs-Rails: Right Alternate Level Stairs-Number of Steps: flight Discharge Home Access: Stairs to enter Entrance Stairs-Rails: Can reach both Entrance Stairs-Number of Steps: 3 Discharge Bathroom Shower/Tub: Walk-in shower Discharge Bathroom Toilet: Standard Discharge Bathroom Accessibility: Yes How Accessible: Accessible via walker Does the patient have any problems obtaining your medications?: No  Social/Family/Support Systems Patient Roles: Spouse, Parent Anticipated Caregiver: Wife, Danae Chen Anticipated Ambulance person Information: [phone] Ability/Limitations of Caregiver: will be able to provide support at home Caregiver Availability: 24/7 Discharge Plan Discussed with Primary Caregiver: Yes Is Caregiver In Agreement with Plan?: Yes Does Caregiver/Family have Issues with Lodging/Transportation while Pt is in Rehab?: No  Goals Patient/Family Goal for Rehab: Mod I, PT, OT Expected length of stay: 7 - 10 days Pt/Family Agrees to Admission and willing to participate: Yes Program Orientation Provided & Reviewed with Pt/Caregiver Including Roles  & Responsibilities: Yes  Barriers to Discharge: Insurance for SNF coverage  Decrease burden of Care through IP rehab admission: Othern/a  Possible need for SNF placement upon discharge: not anticipated  Patient Condition: I have reviewed medical records from [facility], spoken with  TOC , and patient and spouse. I met with patient at the bedside for inpatient rehabilitation assessment.  Patient will benefit from ongoing PT and OT, can actively participate in 3 hours of therapy a day 5 days of the week, and can make measurable gains during the admission.  Patient will also benefit from the coordinated team approach during an Inpatient Acute Rehabilitation admission.  The patient will receive intensive therapy as well as Rehabilitation physician, nursing, social worker,  and care management interventions.  Due to safety, skin/wound care, disease management, medication administration, and patient education the patient requires 24 hour a day rehabilitation nursing.  The patient is currently min/mod A with mobility and basic ADLs.  Discharge setting and therapy post discharge at home with home health is anticipated.  Patient has agreed to participate in the Acute Inpatient Rehabilitation Program and will admit today, 03/05/22.  Preadmission Screen Completed By:  Retta Diones, 03/05/2022 11:23 AM ______________________________________________________________________   Discussed status with Dr. Curlene Dolphin on 03/05/22 at 0945 and received approval for admission today.  Admission Coordinator:  Retta Diones, RN, time 1123/Date 03/05/22   Assessment/Plan: Diagnosis:CVA posterior limb right internal capsule  Does the need for close, 24 hr/day Medical supervision in concert with the patient's rehab needs make it unreasonable for this patient to be served in a less intensive setting? Yes Co-Morbidities requiring supervision/potential complications: HTN, ADHD, Obesity, Recent Covid-19 infection, HLD Due to bladder management, bowel management, safety, skin/wound care, disease management, medication administration, pain management, and patient education, does the patient require 24 hr/day rehab nursing? Yes Does the patient require coordinated care of a physician, rehab nurse, PT, OT, and SLP to address physical and functional deficits in the context  of the above medical diagnosis(es)? Yes Addressing deficits in the following areas: balance, endurance, locomotion, strength, transferring, bowel/bladder control, bathing, dressing, feeding, grooming, toileting, cognition, and psychosocial support Can the patient actively participate in an intensive therapy program of at least 3 hrs of therapy 5 days a week? Yes The potential for patient to make measurable gains while on  inpatient rehab is excellent Anticipated functional outcomes upon discharge from inpatient rehab: modified independent PT, modified independent OT, n/a SLP Estimated rehab length of stay to reach the above functional goals is: 3-5 days Anticipated discharge destination: Home 10. Overall Rehab/Functional Prognosis: excellent   MD Signature: Jennye Boroughs

## 2022-03-04 NOTE — Progress Notes (Signed)
[facility] PROGRESS NOTE   Charles Farley [id number] DOB: 1970-06-20 DOA: 03/01/2022  PCP: Ferd Hibbs, NP  Brief History/Interval Summary: 51 year old Caucasian male with a past medical history of essential hypertension though he is noncompliant with his medications, obesity, hyperlipidemia presented with left arm weakness.  Also complained of heaviness of the left lower extremity.  Patient tested positive for COVID-19 11 days prior to admission.  Does not have any respiratory symptoms currently.  Hospitalized for further management.  Consultants: Neurology.  Seen by telemetry neurology at [facility].    Procedures: Transthoracic echocardiogram    Subjective/Interval History: Has noted some improvement on the left side.  No other complaints offered.   Assessment/Plan:  Left-sided weakness Patient had strokelike symptoms.  Patient was seen by telemetry neurology at [facility].  CT angiogram head and neck did not show any significant stenosis. MRI brain revealed an acute stroke LDL is 142.  HbA1c 5.1.  HIV nonreactive. Patient on rosuvastatin. Echocardiogram shows normal left ventricular systolic function with grade 1 diastolic dysfunction. Aspirin and Plavix recommended by neurology for 3 weeks followed by aspirin alone. Please review neurology notes for further details.  Hyperlipidemia Noted to be on rosuvastatin.  Essential hypertension Currently allowing permissive hypertension. May not have been compliant with his antihypertensives.   Consider starting antihypertensives after he is over his acute phase of stroke.  Hypokalemia Repleted.  Will give additional dose today.  Magnesium was 2.1.  Obesity Estimated body mass index is 42.72 kg/m as calculated from the following:   Height as of this encounter: 6' (1.829 m).   Weight as of this encounter: 142.9 kg.   DVT Prophylaxis: Lovenox Code Status: Full code Family  Communication: Discussed with patient Disposition Plan: CIR is recommended  Status is: Inpatient Remains inpatient appropriate because: Acute stroke        Medications: Scheduled:   stroke: early stages of recovery book   Does not apply Once   aspirin EC  81 mg Oral Daily   clopidogrel  75 mg Oral Daily   enoxaparin (LOVENOX) injection  40 mg Subcutaneous Q24H   potassium chloride  40 mEq Oral Once   rosuvastatin  40 mg Oral Daily   Continuous: [id number] **OR** acetaminophen (TYLENOL) oral liquid 160 mg/5 mL **OR** acetaminophen, LORazepam  Antibiotics: Anti-infectives (From admission, onward)    None       Objective:  Vital Signs  Vitals:   03/03/22 2057 03/04/22 0014 03/04/22 0408 03/04/22 0759  BP: (!) 145/67 (!) 144/82 (!) 149/67 (!) 157/82  Pulse: 73 77 72 81  Resp: 20 19 19 19   Temp: 98 F (36.7 C) 98.2 F (36.8 C) 98 F (36.7 C) 98 F (36.7 C)  TempSrc: Oral Oral Oral Oral  SpO2: 97% 95% 96% 96%  Weight:      Height:        Intake/Output Summary (Last 24 hours) at 03/04/2022 1207 Last data filed at 03/04/2022 1030 Gross per 24 hour  Intake 800 ml  Output --  Net 800 ml   Filed Weights   03/01/22 2004  Weight: (!) 142.9 kg    General appearance: Awake alert.  In no distress Resp: Clear to auscultation bilaterally.  Normal effort Cardio: S1-S2 is normal regular.  No S3-S4.  No rubs murmurs or bruit GI: Abdomen is soft.  Nontender nondistended.  Bowel sounds are present normal.  No masses organomegaly Extremities: No edema.  Full range of motion of lower extremities.  Neurologic: Alert and oriented x3.  left hemiparesis is noted.   Lab Results:  Data Reviewed: I have personally reviewed following labs and reports of the imaging studies  CBC: Recent Labs  Lab 03/01/22 2013 03/04/22 0344  WBC 9.2 10.2  NEUTROABS 5.6  --   HGB 14.6 14.9  HCT 42.1 43.7  MCV 83.0 83.7  PLT 297 381    Basic Metabolic Panel: Recent Labs   Lab 03/01/22 2013 03/04/22 0344  NA 140 139  K 3.3* 3.5  CL 106 104  CO2 27 25  GLUCOSE 113* 103*  BUN 16 14  CREATININE 0.98 1.03  CALCIUM 8.4* 8.8*  MG  --  2.1    GFR: Estimated Creatinine Clearance: 125.8 mL/min (by C-G formula based on SCr of 1.03 mg/dL).  Liver Function Tests: Recent Labs  Lab 03/01/22 2013  AST 18  ALT 23  ALKPHOS 52  BILITOT 0.6  PROT 7.0  ALBUMIN 3.9     Coagulation Profile: Recent Labs  Lab 03/01/22 2013  INR 1.0     HbA1C: Recent Labs    03/03/22 0444  HGBA1C 5.1     Lipid Profile: Recent Labs    03/01/22 2013  CHOL 223*  HDL 35*  LDLCALC 142*  TRIG 232*  CHOLHDL 6.4     Radiology Studies: ECHOCARDIOGRAM COMPLETE  Result Date: 03/03/2022    ECHOCARDIOGRAM REPORT   Patient Name:   Charles Farley Date of Exam: 03/03/2022 Medical Rec #:  [id number]     Height:       72.0 in Accession #:    [id number]    Weight:       315.0 lb Date of Birth:  05/03/1971     BSA:          2.584 m Patient Age:    67 years      BP:           176/89 mmHg Patient Gender: M             HR:           69 bpm. Exam Location:  Inpatient Procedure: 2D Echo, Cardiac Doppler, Color Doppler and Intracardiac            Opacification Agent Indications:    Stroke I63.9  History:        Patient has no prior history of Echocardiogram examinations.                 Risk Factors:Hypertension, Dyslipidemia and Non-Smoker.  Sonographer:    Greer Pickerel Referring Phys: [phone] JARED M GARDNER  Sonographer Comments: Technically difficult study due to poor echo windows, no subcostal window and patient is obese. Image acquisition challenging due to respiratory motion. IMPRESSIONS  1. Left ventricular ejection fraction, by estimation, is 60 to 65%. The left ventricle has normal function. The left ventricle has no regional wall motion abnormalities. There is mild concentric left ventricular hypertrophy. Left ventricular diastolic parameters are consistent with Grade I diastolic  dysfunction (impaired relaxation).  2. Right ventricular systolic function is normal. The right ventricular size is normal. Tricuspid regurgitation signal is inadequate for assessing PA pressure.  3. The mitral valve is normal in structure. Trivial mitral valve regurgitation. No evidence of mitral stenosis.  4. The aortic valve is tricuspid. Aortic valve regurgitation is mild. No aortic stenosis is present.  5. The inferior vena cava is normal in size with greater than 50% respiratory variability, suggesting right atrial pressure of 3 mmHg.  Comparison(s): No prior Echocardiogram. Conclusion(s)/Recommendation(s): No intracardiac source of embolism detected on this transthoracic study. Consider a transesophageal echocardiogram to exclude cardiac source of embolism if clinically indicated. FINDINGS  Left Ventricle: Left ventricular ejection fraction, by estimation, is 60 to 65%. The left ventricle has normal function. The left ventricle has no regional wall motion abnormalities. Definity contrast agent was given IV to delineate the left ventricular  endocardial borders. The left ventricular internal cavity size was normal in size. There is mild concentric left ventricular hypertrophy. Left ventricular diastolic parameters are consistent with Grade I diastolic dysfunction (impaired relaxation). Right Ventricle: The right ventricular size is normal. No increase in right ventricular wall thickness. Right ventricular systolic function is normal. Tricuspid regurgitation signal is inadequate for assessing PA pressure. Left Atrium: Left atrial size was normal in size. Right Atrium: Right atrial size was normal in size. Pericardium: There is no evidence of pericardial effusion. Mitral Valve: The mitral valve is normal in structure. Trivial mitral valve regurgitation. No evidence of mitral valve stenosis. Tricuspid Valve: The tricuspid valve is normal in structure. Tricuspid valve regurgitation is trivial. Aortic Valve: The  aortic valve is tricuspid. Aortic valve regurgitation is mild. No aortic stenosis is present. Pulmonic Valve: The pulmonic valve was normal in structure. Pulmonic valve regurgitation is trivial. Aorta: The aortic root and ascending aorta are structurally normal, with no evidence of dilitation. Venous: The inferior vena cava is normal in size with greater than 50% respiratory variability, suggesting right atrial pressure of 3 mmHg. IAS/Shunts: The atrial septum is grossly normal.  LEFT VENTRICLE PLAX 2D LVIDd:         5.10 cm   Diastology LVIDs:         4.00 cm   LV e' medial:    5.26 cm/s LV PW:         1.00 cm   LV E/e' medial:  13.5 LV IVS:        1.00 cm   LV e' lateral:   9.90 cm/s LVOT diam:     2.20 cm   LV E/e' lateral: 7.2 LV SV:         81 LV SV Index:   31 LVOT Area:     3.80 cm  RIGHT VENTRICLE RV S prime:     19.60 cm/s TAPSE (M-mode): 2.6 cm LEFT ATRIUM             Index        RIGHT ATRIUM           Index LA diam:        3.80 cm 1.47 cm/m   RA Area:     17.20 cm LA Vol (A2C):   42.9 ml 16.60 ml/m  RA Volume:   44.60 ml  17.26 ml/m LA Vol (A4C):   46.2 ml 17.88 ml/m LA Biplane Vol: 45.1 ml 17.45 ml/m  AORTIC VALVE LVOT Vmax:   111.00 cm/s LVOT Vmean:  71.800 cm/s LVOT VTI:    0.212 m  AORTA Ao Root diam: 3.60 cm Ao Asc diam:  3.40 cm MITRAL VALVE MV Area (PHT): 2.87 cm    SHUNTS MV Decel Time: 264 msec    Systemic VTI:  0.21 m MV E velocity: 71.00 cm/s  Systemic Diam: 2.20 cm MV A velocity: 97.80 cm/s MV E/A ratio:  0.73 Gwyndolyn Kaufman MD Electronically signed by Gwyndolyn Kaufman MD Signature Date/Time: 03/03/2022/11:44:33 AM    Final    MR BRAIN WO CONTRAST  Result Date: 03/03/2022 CLINICAL  DATA:  Stroke follow-up. EXAM: MRI HEAD WITHOUT CONTRAST TECHNIQUE: Multiplanar, multiecho pulse sequences of the brain and surrounding structures were obtained without intravenous contrast. COMPARISON:  CT head 03/01/22 FINDINGS: Brain: Acute infarct in the posterior limb of the right internal  capsule (series 2, image 22). No evidence of hemorrhage. No extra-axial fluid collection. There is sequela of moderate chronic microvascular ischemic change. No hydrocephalus. Vascular: Normal flow voids. Skull and upper cervical spine: Normal marrow signal. Sinuses/Orbits: Bilateral maxillary sinus mucosal thickening. Other: None. IMPRESSION: Acute infarct in the posterior limb of the right internal capsule. No hemorrhage. Electronically Signed   By: Marin Roberts M.D.   On: 03/03/2022 08:31       LOS: 1 day   [facility] Pager on [email or web address]  03/04/2022, 12:07 PM

## 2022-03-05 ENCOUNTER — Other Ambulatory Visit: Payer: Self-pay

## 2022-03-05 ENCOUNTER — Inpatient Hospital Stay (HOSPITAL_COMMUNITY)
Admission: RE | Admit: 2022-03-05 | Discharge: 2022-03-09 | DRG: 057 | Disposition: A | Discharge status: Home / Self Care | Payer: 59 | Source: Intra-hospital | Attending: Physical Medicine & Rehabilitation | Admitting: Physical Medicine & Rehabilitation

## 2022-03-05 ENCOUNTER — Encounter (HOSPITAL_COMMUNITY): Payer: Self-pay | Admitting: Physical Medicine & Rehabilitation

## 2022-03-05 DIAGNOSIS — Z8616 Personal history of COVID-19: Secondary | ICD-10-CM | POA: Diagnosis not present

## 2022-03-05 DIAGNOSIS — F909 Attention-deficit hyperactivity disorder, unspecified type: Secondary | ICD-10-CM | POA: Diagnosis not present

## 2022-03-05 DIAGNOSIS — E669 Obesity, unspecified: Secondary | ICD-10-CM | POA: Diagnosis not present

## 2022-03-05 DIAGNOSIS — U099 Post covid-19 condition, unspecified: Secondary | ICD-10-CM | POA: Diagnosis not present

## 2022-03-05 DIAGNOSIS — Z91199 Patient's noncompliance with other medical treatment and regimen due to unspecified reason: Secondary | ICD-10-CM | POA: Diagnosis not present

## 2022-03-05 DIAGNOSIS — E876 Hypokalemia: Secondary | ICD-10-CM | POA: Diagnosis not present

## 2022-03-05 DIAGNOSIS — I639 Cerebral infarction, unspecified: Secondary | ICD-10-CM | POA: Diagnosis not present

## 2022-03-05 DIAGNOSIS — I10 Essential (primary) hypertension: Secondary | ICD-10-CM | POA: Diagnosis not present

## 2022-03-05 DIAGNOSIS — R0989 Other specified symptoms and signs involving the circulatory and respiratory systems: Secondary | ICD-10-CM | POA: Diagnosis present

## 2022-03-05 DIAGNOSIS — I6350 Cerebral infarction due to unspecified occlusion or stenosis of unspecified cerebral artery: Secondary | ICD-10-CM | POA: Diagnosis not present

## 2022-03-05 DIAGNOSIS — E785 Hyperlipidemia, unspecified: Secondary | ICD-10-CM | POA: Diagnosis present

## 2022-03-05 DIAGNOSIS — Z6841 Body Mass Index (BMI) 40.0 and over, adult: Secondary | ICD-10-CM | POA: Diagnosis not present

## 2022-03-05 DIAGNOSIS — I69398 Other sequelae of cerebral infarction: Principal | ICD-10-CM

## 2022-03-05 DIAGNOSIS — Z79899 Other long term (current) drug therapy: Secondary | ICD-10-CM

## 2022-03-05 DIAGNOSIS — Z7982 Long term (current) use of aspirin: Secondary | ICD-10-CM

## 2022-03-05 MED ORDER — ASPIRIN 81 MG PO TBEC: 81.0000 mg | DELAYED_RELEASE_TABLET | Freq: Every day | ORAL | 1 refills | Status: DC

## 2022-03-05 MED ORDER — ROSUVASTATIN CALCIUM 20 MG PO TABS
40.0000 mg | ORAL_TABLET | Freq: Every day | ORAL | Status: DC
  Administered 2022-03-06 – 2022-03-09 (×4): 40 mg via ORAL
  Filled 2022-03-05 (×4): qty 2

## 2022-03-05 MED ORDER — ENOXAPARIN SODIUM 40 MG/0.4ML IJ SOSY: 40.0000 mg | PREFILLED_SYRINGE | INTRAMUSCULAR | Status: DC

## 2022-03-05 MED ORDER — CLOPIDOGREL BISULFATE 75 MG PO TABS
75.0000 mg | ORAL_TABLET | Freq: Every day | ORAL | Status: DC
  Administered 2022-03-06 – 2022-03-09 (×4): 75 mg via ORAL
  Filled 2022-03-05 (×5): qty 1

## 2022-03-05 MED ORDER — ACETAMINOPHEN 325 MG PO TABS: 650.0000 mg | ORAL_TABLET | ORAL | Status: DC | PRN

## 2022-03-05 MED ORDER — ROSUVASTATIN CALCIUM 40 MG PO TABS: 40.0000 mg | ORAL_TABLET | Freq: Every day | ORAL | 1 refills | Status: DC

## 2022-03-05 MED ORDER — CLOPIDOGREL BISULFATE 75 MG PO TABS: 75.0000 mg | ORAL_TABLET | Freq: Every day | ORAL | 0 refills | Status: DC

## 2022-03-05 MED ORDER — ENOXAPARIN SODIUM 40 MG/0.4ML IJ SOSY
40.0000 mg | PREFILLED_SYRINGE | INTRAMUSCULAR | Status: DC
  Administered 2022-03-06 – 2022-03-07 (×2): 40 mg via SUBCUTANEOUS
  Filled 2022-03-05 (×2): qty 0.4

## 2022-03-05 MED ORDER — GUAIFENESIN 100 MG/5ML PO LIQD: 10.0000 mL | ORAL | Status: DC | PRN

## 2022-03-05 MED ORDER — ACETAMINOPHEN 160 MG/5ML PO SOLN: 650.0000 mg | ORAL | Status: DC | PRN

## 2022-03-05 MED ORDER — ASPIRIN 81 MG PO TBEC
81.0000 mg | DELAYED_RELEASE_TABLET | Freq: Every day | ORAL | Status: DC
  Administered 2022-03-06 – 2022-03-09 (×4): 81 mg via ORAL
  Filled 2022-03-05 (×5): qty 1

## 2022-03-05 MED ORDER — ACETAMINOPHEN 650 MG RE SUPP: 650.0000 mg | RECTAL | Status: DC | PRN

## 2022-03-05 NOTE — Progress Notes (Signed)
IP rehab admissions - I am admitting to inpatient rehab today.  Patient has approval from [facility].  Bed available on CIR today.  Call me for questions.  [phone]

## 2022-03-05 NOTE — Progress Notes (Signed)
>>   Charles Farley, [facility] 03/05/2022 14:01 Inpatient Rehabilitation Admission Medication Review by a Pharmacist   A complete drug regimen review was completed for this patient to identify any potential clinically significant medication issues.   High Risk Drug Classes Is patient taking? Indication by Medication  Antipsychotic No    Anticoagulant Yes Lovenox-VTE px  Antibiotic No    Opioid No    Antiplatelet Yes Plavix x 18 days with aspirin, then aspirin alone thereafter  Hypoglycemics/insulin No    Vasoactive Medication No    Chemotherapy No    Other Yes Crestor-HLD Guafenesin-cough        Type of Medication Issue Identified Description of Issue Recommendation(s)  Drug Interaction(s) (clinically significant)        Duplicate Therapy        Allergy        No Medication Administration End Date        Incorrect Dose        Additional Drug Therapy Needed        Significant med changes from prior encounter (inform family/care partners about these prior to discharge).      Other            Clinically significant medication issues were identified that warrant physician communication and completion of prescribed/recommended actions by midnight of the next day:  No   Name of provider notified for urgent issues identified:    Provider Method of Notification:        Pharmacist comments:    Time spent performing this drug regimen review (minutes):  10     Jayce Kainz A. Levada Dy, PharmD, BCPS, FNKF Clinical Pharmacist [place] Please utilize Amion for appropriate phone number to reach the unit pharmacist ([place])

## 2022-03-05 NOTE — Progress Notes (Signed)
Patient transferred from 3W. Report given by Parks Ranger. Patient arrived in no distress, alert and oriented x4, pleasant and cooperative. Patient denies any pain, states he has some resiudal left sided weakness. His left arm feels heavy and his left leg is weak. Patient states he has been walking with a walker. Patient refused male genitalia exam, but states he is "good down there" and he has been working up until this recent hospital visit so he does not have skin breakdown he states. 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 His wife is a NP and visiting this shift. Patient anxious to walk, but informed he has to be assessed by therapy. Patient agreeable. Patient had a bowel movement this shift after arrival. Informed of call bell and it's use. Informed of hospital policy of personal belongings- patient has reading glasses and a cell phone.

## 2022-03-05 NOTE — Progress Notes (Signed)
Occupational Therapy Treatment Patient Details Name: Charles Farley MRN: [id number] DOB: 1970/07/15 Today's Date: 03/05/2022   History of present illness Charles Farley is a 51 y.o. male who presented to ED with c/o L UE/LE weakness and heaviness sensation with some expressive aphasia. PMH: HTN, obesity, had COVID 11 days ago   OT comments  Pt progressing towards established OT goals. Performing bed mobility with mod I and transfers with min guard A. Pt brushing teeth at sink and observed to utilize compensatory techniques for reapplying cap to toothpaste. Pt performing dynamic reaching task with up to min A for balance. Observed with slight dysmetria this session and addressing with erasing marker from mirror in preparation for participation in IADL and work-related tasks. Performing LUE HEP this session focused on FM and GM strengthening and FM coordination (see below). Due to pt motivation, significant change in functional status, and support, continue to highly recommend AIR for intensive rehabilitation.    Recommendations for follow up therapy are one component of a multi-disciplinary discharge planning process, led by the attending physician.  Recommendations may be updated based on patient status, additional functional criteria and insurance authorization.    Follow Up Recommendations  Acute inpatient rehab (3hours/day)    Assistance Recommended at Discharge Intermittent Supervision/Assistance  Patient can return home with the following  A little help with walking and/or transfers;A little help with bathing/dressing/bathroom;Assistance with cooking/housework;Assist for transportation;Help with stairs or ramp for entrance   Equipment Recommendations  BSC/3in1    Recommendations for Other Services PT consult;Rehab consult    Precautions / Restrictions Precautions Precautions: Fall Precaution Comments: L LE lean; weakness Restrictions Weight Bearing Restrictions: No        Mobility Bed Mobility Overal bed mobility: Modified Independent                  Transfers Overall transfer level: Needs assistance Equipment used: Rolling walker (2 wheels) Transfers: Sit to/from Stand Sit to Stand: Min guard           General transfer comment: Uses momentum to power up. Min cues for safe hand placement     Balance Overall balance assessment: Needs assistance Sitting-balance support: Feet supported, No upper extremity supported Sitting balance-Leahy Scale: Good Sitting balance - Comments: pt able to adjust socks while at EOB without LOB, however, min guard A for safety with achievement of figure 4 positin   Standing balance support: During functional activity, Reliant on assistive device for balance Standing balance-Leahy Scale: Poor Standing balance comment: pt reliant on RW for support during standing balance task                           ADL either performed or assessed with clinical judgement   ADL Overall ADL's : Needs assistance/impaired     Grooming: Oral care;Standing;Min guard Grooming Details (indicate cue type and reason): FM coordination deficits noted. Placing cap on toothpaste with LUE, however, twisting tube of toothpaste to don cap rather than twisting cap onto tooth paste             Lower Body Dressing: Min guard;Sit to/from stand Lower Body Dressing Details (indicate cue type and reason): Min gaurd A to don socks sitting EOB. Pt lifting LLE with BUE to achieve figure 4 position Toilet Transfer: Min guard;Minimal assistance;Ambulation;Rolling walker (2 wheels) Toilet Transfer Details (indicate cue type and reason): Overall min guard A. Simulated in room. Occasionally minimally off balance, and pt requiring up  to min A.         Functional mobility during ADLs: Min guard;Rolling walker (2 wheels) General ADL Comments: Min guard A for RW. Limited by LUE coordination. Performing standing task of eracing dots drawn  on mirror with L index finger to address undershooting/slight dysmetria. Pt removing 1/2 of dot/circle on first attempt with additional repetitions and increased time.    Extremity/Trunk Assessment Upper Extremity Assessment Upper Extremity Assessment: LUE deficits/detail LUE Deficits / Details: AROM overall WFL. minimally weaker than R dominant UE; attmepting to use for functional tasks; fine motor/in-hand manipulation skill deficits - slightly incoordinated but pt reporting has improved since eval. LUE Sensation: decreased proprioception (decr finger to nose) LUE Coordination: decreased fine motor   Lower Extremity Assessment Lower Extremity Assessment: Defer to PT evaluation        Vision   Additional Comments: No apparent visual deficits. Some undershooting, but no double vision. Pt reporting no changes and believe undershooting due to LUE deficits   Perception     Praxis      Cognition Arousal/Alertness: Awake/alert Behavior During Therapy: WFL for tasks assessed/performed Overall Cognitive Status: Within Functional Limits for tasks assessed                                 General Comments: pt responsive to all questions asked and aware of deficits. Pt able to verbally describe what he has been working on in therapy and demonstrate good awareness. Recognizing OT moving on his L side in peripheral vision. Asking meaningful and insightful questions throughout        Exercises Exercises: Other exercises Other Exercises Other Exercises: Reaching to L and R outside of BOS to simulate return to IADL such as putting dishes away. Pt requiring min guard when reaching toward L and up to min A with reaching toward R Other Exercises: Red theraband scapular retraction x10 Other Exercises: red theraband shoulder external rotation Other Exercises: Red theraband shoulder flexion LUE 10x; increased effort Other Exercises: Red theraputty composite grasp; Finger opposition 2x;  In hand manipulation 10x LUE    Shoulder Instructions       General Comments VSS    Pertinent Vitals/ Pain       Pain Assessment Pain Assessment: No/denies pain Faces Pain Scale: No hurt  Home Living                                          Prior Functioning/Environment              Frequency  Min 2X/week        Progress Toward Goals  OT Goals(current goals can now be found in the care plan section)  Progress towards OT goals: Progressing toward goals  Acute Rehab OT Goals Patient Stated Goal: go to rehab and get back to independence OT Goal Formulation: With patient Time For Goal Achievement: 03/17/22 Potential to Achieve Goals: Good ADL Goals Pt Will Perform Lower Body Bathing: with modified independence Pt Will Perform Lower Body Dressing: with modified independence;sit to/from stand Pt Will Transfer to Toilet: with modified independence;ambulating Pt Will Perform Toileting - Clothing Manipulation and hygiene: with modified independence Pt/caregiver will Perform Home Exercise Program: Left upper extremity;Independently;With written HEP provided Additional ADL Goal #1: Pt will independently verbalize 3 strategies to reduce risk of falls  Plan  Discharge plan remains appropriate;Frequency remains appropriate    Co-evaluation                 AM-PAC OT "6 Clicks" Daily Activity     Outcome Measure   Help from another person eating meals?: None Help from another person taking care of personal grooming?: A Little Help from another person toileting, which includes using toliet, bedpan, or urinal?: A Little Help from another person bathing (including washing, rinsing, drying)?: A Little Help from another person to put on and taking off regular upper body clothing?: A Little Help from another person to put on and taking off regular lower body clothing?: A Little 6 Click Score: 19    End of Session Equipment Utilized During Treatment:  Gait belt;Rolling walker (2 wheels)  OT Visit Diagnosis: Unsteadiness on feet (R26.81);Other abnormalities of gait and mobility (R26.89);History of falling (Z91.81);Muscle weakness (generalized) (M62.81)   Activity Tolerance Patient tolerated treatment well   Patient Left in chair;with call bell/phone within reach (Pt verbalizing he knows to call before getting up without prompting)   Nurse Communication Mobility status        Time: 9024-0973 OT Time Calculation (min): 33 min  Charges: OT General Charges $OT Visit: 1 Visit OT Treatments $Self Care/Home Management : 8-22 mins $Therapeutic Exercise: 8-22 mins  Charles Farley, Charles Farley [facility] Acute Rehabilitation Office: [phone]   Charles Farley 03/05/2022, 10:37 AM

## 2022-03-05 NOTE — Discharge Instructions (Signed)

## 2022-03-05 NOTE — Progress Notes (Signed)
PMR Admission Coordinator Pre-Admission Assessment   Patient: Charles Farley is an 51 y.o., male MRN: [id number] DOB: 07/12/1970 Height: 6' (182.9 cm) Weight: (!) 142.9 kg   Insurance Information HMO:     PPO: yes     PCP:      IPA:      80/20:      OTHER:  PRIMARYJanit Bern      Policy#: [id number], group # [id number]      Subscriber: patient CM Name: Lattie Haw      Phone#: [phone]    Fax#: [phone] Pre-Cert#: [id number] approved for 7 days with update due 03/12/22     Employer: [employer] Benefits:  Phone #: [phone]     Name: Verified Eff. Date: 09/16/2017     Deduct: $350 ( $63met)      Out of Pocket Max: $7,900 ($20 met)       CIR: 80% coverage, 20% co-insurance      SNF: 80% coverage, 20% co-insurance Outpatient: $20 copay/visit after deductable      Home Health: 80% coverage, 20% co-insurance       DME: 80% coverage, 20% co-insurance      Providers: patient choice   The "Data Collection Information Summary" for patients in Inpatient Rehabilitation Facilities with attached "Privacy Act [place] Records" was provided and verbally reviewed with: N/A   Emergency Contact Information Contact Information       Name Relation Home Work Mobile    Wann,ERICA Spouse [phone]               Current Medical History  Patient Admitting Diagnosis: R CVA   History of Present Illness: Charles Farley is a 51 year old right-handed male with history of hypertension, hyperlipidemia, ADHD, obesity with BMI 42.72, recent COVID positive as well as questionable medical compliance to valsartan as well as Crestor.  Per chart review patient lives with spouse.  Two-level home 3 steps to entry.  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  Wife is a Designer, jewellery. Presented 03/01/2022 with left-sided numbness, weakness and word finding difficulty.  Blood pressure 172/79.  Cranial CT scan negative, CT angiogram head and neck unremarkable.  Patient did  not receive tPA.  Admission chemistries unremarkable except potassium 3.3, glucose 113, cholesterol 223, triglyceride 232, urine drug screen and alcohol negative.  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  Echocardiogram with ejection fraction of 60 to 65% no wall motion abnormalities grade 1 diastolic dysfunction.  Neurology follow-up placed on aspirin 81 mg daily and Plavix 75 mg daily x3 weeks then aspirin alone.  Lovenox added for DVT prophylaxis.  Tolerating a regular diet.  Therapy evaluations completed due to patient's left-sided weakness/word finding difficulties.  Patient to be admitted for a comprehensive inpatient rehab program.   Complete NIHSS TOTAL: 0   [facility] record [facility] has been reviewed by the rehabilitation admission coordinator and physician.   Past Medical History      Past Medical History:  Diagnosis Date   ADHD     Hypertension        Has the patient had major surgery during 100 days prior to admission? No   Family History   family history is not on file.   Current Medications   Current Facility-Administered Medications:     stroke: early stages of recovery book, , Does not apply, Once, Alcario Drought, Jared M, DO   acetaminophen (TYLENOL) tablet 650 mg, 650 mg,  Oral, Q4H PRN **OR** acetaminophen (TYLENOL) 160 MG/5ML solution 650 mg, 650 mg, Per Tube, Q4H PRN **OR** acetaminophen (TYLENOL) suppository 650 mg, 650 mg, Rectal, Q4H PRN, Alcario Drought, Jared M, DO   aspirin EC tablet 81 mg, 81 mg, Oral, Daily, Alcario Drought, Jared M, DO, 81 mg at 03/05/22 0915   clopidogrel (PLAVIX) tablet 75 mg, 75 mg, Oral, Daily, Shafer, Devon, NP, 75 mg at 03/05/22 0915   enoxaparin (LOVENOX) injection 40 mg, 40 mg, Subcutaneous, Q24H, Alcario Drought, Jared M, DO, 40 mg at 03/04/22 1319   LORazepam (ATIVAN) injection 1 mg, 1 mg, Intravenous, Once PRN, Alcario Drought, Jared M, DO   rosuvastatin (CRESTOR) tablet 40 mg, 40 mg, Oral, Daily, Rosalin Hawking, MD, 40 mg at  03/05/22 0915   Patients Current Diet:  Diet Order                  Diet - low sodium heart healthy             Diet Heart Room service appropriate? Yes; Fluid consistency: Thin  Diet effective now                         Precautions / Restrictions Precautions Precautions: Fall Precaution Comments: L LE lean; weakness Restrictions Weight Bearing Restrictions: No    Has the patient had 2 or more falls or a fall with injury in the past year? No   Prior Activity Level Community (5-7x/wk): Independent, working full time   Prior Functional Level Self Care: Did the patient need help bathing, dressing, using the toilet or eating? Independent   Indoor Mobility: Did the patient need assistance with walking from room to room (with or without device)? Independent   Stairs: Did the patient need assistance with internal or external stairs (with or without device)? Independent   Functional Cognition: Did the patient need help planning regular tasks such as shopping or remembering to take medications? Independent   Patient Information Are you of Hispanic, Latino/a,or Spanish origin?: A. No, not of Hispanic, Latino/a, or Spanish origin What is your race?: A. White Do you need or want an interpreter to communicate with a doctor or health care staff?: 0. No   Patient's Response To:  Health Literacy and Transportation Is the patient able to respond to health literacy and transportation needs?: Yes Health Literacy - How often do you need to have someone help you when you read instructions, pamphlets, or other written material from your doctor or pharmacy?: Never In the past 12 months, has lack of transportation kept you from medical appointments or from getting medications?: No In the past 12 months, has lack of transportation kept you from meetings, work, or from getting things needed for daily living?: No   Development worker, international aid / [place] Devices/Equipment:  None Home Equipment: None   Prior Device Use: Indicate devices/aids used by the patient prior to current illness, exacerbation or injury? None of the above   Current Functional Level Cognition   Arousal/Alertness: Awake/alert Overall Cognitive Status: Within Functional Limits for tasks assessed Orientation Level: Oriented X4 General Comments: pt responsive to all questions asked and aware of deficits. Pt able to verbally describe what he has been working on in therapy and demonstrate good awareness. Recognizing OT moving on his L side in peripheral vision. Asking meaningful and insightful questions throughout Attention: Focused Focused Attention: Appears intact Memory: Appears intact Awareness: Appears intact Problem Solving: Appears intact    Extremity Assessment (includes Sensation/Coordination)  Upper Extremity Assessment: LUE deficits/detail LUE Deficits / Details: AROM overall WFL. minimally weaker than R dominant UE; attmepting to use for functional tasks; fine motor/in-hand manipulation skill deficits - slightly incoordinated but pt reporting has improved since eval. LUE Sensation: decreased proprioception (decr finger to nose) LUE Coordination: decreased fine motor  Lower Extremity Assessment: Defer to PT evaluation LLE Deficits / Details: mild weakness, noted mild ataxia with ambulation, pt reports feeling super weak LLE Sensation:  (feeling of heaviness) LLE Coordination: decreased gross motor     ADLs   Overall ADL's : Needs assistance/impaired Grooming: Oral care, Standing, Min guard Grooming Details (indicate cue type and reason): FM coordination deficits noted. Placing cap on toothpaste with LUE, however, twisting tube of toothpaste to don cap rather than twisting cap onto tooth paste Upper Body Bathing: Set up, Supervision/ safety, Sitting Lower Body Bathing: Minimal assistance, Sit to/from stand Upper Body Dressing : Minimal assistance Lower Body Dressing: Min  guard, Sit to/from stand Lower Body Dressing Details (indicate cue type and reason): Min gaurd A to don socks sitting EOB. Pt lifting LLE with BUE to achieve figure 4 position Toilet Transfer: Min guard, Minimal assistance, Ambulation, Rolling walker (2 wheels) Toilet Transfer Details (indicate cue type and reason): Overall min guard A. Simulated in room. Occasionally minimally off balance, and pt requiring up to min A. Toileting- Clothing Manipulation and Hygiene: Minimal assistance, Sit to/from stand Functional mobility during ADLs: Min guard, Rolling walker (2 wheels) General ADL Comments: Min guard A for RW. Limited by LUE coordination. Performing standing task of eracing dots drawn on mirror with L index finger to address undershooting/slight dysmetria. Pt removing 1/2 of dot/circle on first attempt with additional repetitions and increased time.     Mobility   Overal bed mobility: Modified Independent Bed Mobility: Sidelying to Sit, Rolling Rolling: Supervision Sidelying to sit: Supervision Sit to supine: Min guard General bed mobility comments: HOB elevated, body habitus proves to challenge ability to transfer to EOB     Transfers   Overall transfer level: Needs assistance Equipment used: Rolling walker (2 wheels) Transfers: Sit to/from Stand Sit to Stand: Min guard General transfer comment: Uses momentum to power up. Min cues for safe hand placement     Ambulation / Gait / Stairs / Wheelchair Mobility   Ambulation/Gait Ambulation/Gait assistance: Min assist, Mod assist Gait Distance (Feet): 150 Feet (x1 with RW, 80x1 without AD/HHA) Assistive device: Rolling walker (2 wheels), 1 person hand held assist Gait Pattern/deviations: Step-through pattern, Knees buckling, Ataxic, Drifts right/left, Decreased dorsiflexion - left, Decreased stance time - right General Gait Details: pt amb with RW and with L HHA. with RW pt as able to demo sequential/fluid gait pattern with minimal L knee  buckling however progressive UE dependence with onset of fatigue. Pt vearing to the L with RW as well requiring verbal cues to amb straight. pt then ambulated with RW and L HHA. Pt with decreased L LE step length and height as well as L knee buckling, pt with quicker onset of fatigue and required progressive assist up to modA Gait velocity: Increased gait velocity with RW compared to HHA Gait velocity interpretation: <1.8 ft/sec, indicate of risk for recurrent falls     Posture / Balance Dynamic Sitting Balance Sitting balance - Comments: pt able to adjust socks while at EOB without LOB, however, min guard A for safety with achievement of figure 4 positin Balance Overall balance assessment: Needs assistance Sitting-balance support: Feet supported, No upper extremity supported Sitting  balance-Leahy Scale: Good Sitting balance - Comments: pt able to adjust socks while at EOB without LOB, however, min guard A for safety with achievement of figure 4 positin Postural control: Left lateral lean Standing balance support: During functional activity, Reliant on assistive device for balance Standing balance-Leahy Scale: Poor Standing balance comment: pt reliant on RW for support during standing balance task     Special needs/care consideration Skin intact    Previous Home Environment (from acute therapy documentation) Living Arrangements: Spouse/significant other  Lives With: Spouse, Son Available Help at Discharge: Family, Available PRN/intermittently Type of Home: House Home Layout: Two level Alternate Level Stairs-Rails: Right Alternate Level Stairs-Number of Steps: flight Home Access: Stairs to enter Entrance Stairs-Rails: Can reach both Entrance Stairs-Number of Steps: 3 Bathroom Shower/Tub: Multimedia programmer: Standard Bathroom Accessibility: Yes How Accessible: Accessible via walker Home Care Services: No   Discharge Living Setting Plans for Discharge Living Setting:  Patient's home, Lives with (comment) (spouse children) Type of Home at Discharge: House Discharge Home Layout: Two level Alternate Level Stairs-Rails: Right Alternate Level Stairs-Number of Steps: flight Discharge Home Access: Stairs to enter Entrance Stairs-Rails: Can reach both Entrance Stairs-Number of Steps: 3 Discharge Bathroom Shower/Tub: Walk-in shower Discharge Bathroom Toilet: Standard Discharge Bathroom Accessibility: Yes How Accessible: Accessible via walker Does the patient have any problems obtaining your medications?: No   Social/Family/Support Systems Patient Roles: Spouse, Parent Anticipated Caregiver: Wife, Danae Chen Anticipated Ambulance person Information: [phone] Ability/Limitations of Caregiver: will be able to provide support at home Caregiver Availability: 24/7 Discharge Plan Discussed with Primary Caregiver: Yes Is Caregiver In Agreement with Plan?: Yes Does Caregiver/Family have Issues with Lodging/Transportation while Pt is in Rehab?: No   Goals Patient/Family Goal for Rehab: Mod I, PT, OT Expected length of stay: 7 - 10 days Pt/Family Agrees to Admission and willing to participate: Yes Program Orientation Provided & Reviewed with Pt/Caregiver Including Roles  & Responsibilities: Yes  Barriers to Discharge: Insurance for SNF coverage   Decrease burden of Care through IP rehab admission: Othern/a   Possible need for SNF placement upon discharge: not anticipated   Patient Condition: I have reviewed medical records from [facility], spoken with  TOC , and patient and spouse. I met with patient at the bedside for inpatient rehabilitation assessment.  Patient will benefit from ongoing PT and OT, can actively participate in 3 hours of therapy a day 5 days of the week, and can make measurable gains during the admission.  Patient will also benefit from the coordinated team approach during an Inpatient Acute Rehabilitation admission.  The patient will receive  intensive therapy as well as Rehabilitation physician, nursing, social worker, and care management interventions.  Due to safety, skin/wound care, disease management, medication administration, and patient education the patient requires 24 hour a day rehabilitation nursing.  The patient is currently min/mod A with mobility and basic ADLs.  Discharge setting and therapy post discharge at home with home health is anticipated.  Patient has agreed to participate in the Acute Inpatient Rehabilitation Program and will admit today, 03/05/22.   Preadmission Screen Completed By:  Retta Diones, 03/05/2022 11:23 AM ______________________________________________________________________   Discussed status with Dr. Curlene Dolphin on 03/05/22 at 0945 and received approval for admission today.   Admission Coordinator:  Retta Diones, RN, time 1123/Date 03/05/22    Assessment/Plan: Diagnosis:CVA posterior limb right internal capsule  Does the need for close, 24 hr/day Medical supervision in concert with the patient's rehab needs make it unreasonable for  this patient to be served in a less intensive setting? Yes Co-Morbidities requiring supervision/potential complications: HTN, ADHD, Obesity, Recent Covid-19 infection, HLD Due to bladder management, bowel management, safety, skin/wound care, disease management, medication administration, pain management, and patient education, does the patient require 24 hr/day rehab nursing? Yes Does the patient require coordinated care of a physician, rehab nurse, PT, OT, and SLP to address physical and functional deficits in the context of the above medical diagnosis(es)? Yes Addressing deficits in the following areas: balance, endurance, locomotion, strength, transferring, bowel/bladder control, bathing, dressing, feeding, grooming, toileting, cognition, and psychosocial support Can the patient actively participate in an intensive therapy program of at least 3 hrs of therapy 5  days a week? Yes The potential for patient to make measurable gains while on inpatient rehab is excellent Anticipated functional outcomes upon discharge from inpatient rehab: modified independent PT, modified independent OT, n/a SLP Estimated rehab length of stay to reach the above functional goals is: 3-5 days Anticipated discharge destination: Home 10. Overall Rehab/Functional Prognosis: excellent     MD Signature: Jennye Boroughs         Revision History                                     Note Details  Traci Sermon, MD File Time 03/05/2022 11:43 AM  Author Type Physician Status Signed  Last Editor Jennye Boroughs, MD Service Physical Medicine and Rehabilitation

## 2022-03-05 NOTE — Progress Notes (Addendum)
TRH Progress Note  Patient was supposed to discharge to CIR on 03/04/2022 but did not have insurance approval and hence stayed.  Seen this morning.  Denies new complaints.  Indicates that his left extremity strength is progressively improving and feels more than 50% better than when he came in.  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  Young male, moderately built and morbidly obese, sitting up comfortably in chair without distress.  Vitals:   03/04/22 1949 03/04/22 2328 03/05/22 0430 03/05/22 0726  BP: (!) 157/93 (!) 144/78 139/69 (!) 152/72  Pulse: 81 75 61 65  Resp: 16 16 15 19   Temp: 98.2 F (36.8 C) 98 F (36.7 C) (!) 97.4 F (36.3 C) 98.2 F (36.8 C)  TempSrc: Oral Oral Oral Oral  SpO2: 94% 94% 96% 96%  Weight:      Height:        RS: Clear to auscultation.  No increased work of breathing. CVS: S1 and S2 heard, RRR.  No JVD, murmurs or pedal edema.  Telemetry personally reviewed: Sinus rhythm Abdomen: Nondistended, soft and nontender.  Normal bowel sounds heard. CNS: Alert and oriented x3.  No cranial nerve deficits. Extremities: Right extremities grade 5 x 5 power.  Left upper extremity also seems to be grade 5 x 5 power.  Left lower extremity graded 4+ by 5 power.  Labs: No new labs since 10/17.  Assessment and plan:  Acute ischemic stroke/acute ischemic infarct of the posterior limb of the right internal capsule: With associated left hemiparesis, improving.  Etiology suspected due to small vessel disease.  Stroke team evaluated, completed stroke work-up and have signed off.  Continue aspirin 81 Mg + Plavix 75 Mg x3 weeks followed by aspirin alone, Crestor 40 mg daily.  Recommend compliance with all medications.  Can DC to CIR today since has insurance approval.  Suspect that he may not need prolonged rehab at CIR.  Outpatient follow-up with neurology/Dr. Asencion Partridge Dohmeier.  Hyperlipidemia: Continue statins  Essential hypertension:  Reasonable control given recent acute stroke.  Consider starting antihypertensives based on BP trends as outpatient or at CIR.  Hypokalemia: Replaced.  Body mass index is 42.72 kg/m.  /Morbid obesity: Lifestyle modifications, weight loss and outpatient follow-up.  Possible OSA: Recommend outpatient formal evaluation, can be performed via PCPs office.  Recent COVID-19 infection:?  Precipitant for his stroke.  Vernell Leep, MD,  FACP, [place], [facility], [facility], [facility]   [facility] Advisor [place]     To contact the attending provider between 7A-7P or the covering provider during after hours 7P-7A, please log into the web site [email or web address] and access using universal [place] password for that web site. If you do not have the password, please call the hospital operator.

## 2022-03-05 NOTE — Progress Notes (Signed)
Report called and given to [facility] at [facility].

## 2022-03-05 NOTE — Discharge Instructions (Addendum)
Inpatient Rehab Discharge Instructions  Riki I [place] Discharge date and time: No discharge date for patient encounter.   Activities/Precautions/ Functional Status: Activity: activity as tolerated Diet: regular diet Wound Care: Routine skin checks Functional status:  ___ No restrictions     ___ Walk up steps independently ___ 24/7 supervision/assistance   ___ Walk up steps with assistance ___ Intermittent supervision/assistance  ___ Bathe/dress independently ___ Walk with walker     _x__ Bathe/dress with assistance ___ Walk Independently    ___ Shower independently ___ Walk with assistance    ___ Shower with assistance ___ No alcohol     ___ Return to work/school ________  COMMUNITY REFERRALS UPON DISCHARGE:     Outpatient: PT     OT                 Agency: [facility] Phone: [phone]              Appointment Date/Time: TBD   Special Instructions:  No driving smoking or alcohol   Aspirin 81 mg daily and Plavix 75 mg daily until 03/21/2022 and  then aspirin alone  STROKE/TIA DISCHARGE INSTRUCTIONS SMOKING Cigarette smoking nearly doubles your risk of having a stroke & is the single most alterable risk factor  If you smoke or have smoked in the last 12 months, you are advised to quit smoking for your health. Most of the excess cardiovascular risk related to smoking disappears within a year of stopping. Ask you doctor about anti-smoking medications [place] Quit Line: 1-800-QUIT NOW Free Smoking Cessation Classes [phone]  CHOLESTEROL Know your levels; limit fat & cholesterol in your diet  Lipid Panel     Component Value Date/Time   CHOL 223 (H) 03/01/2022 2013   TRIG 232 (H) 03/01/2022 2013   HDL 35 (L) 03/01/2022 2013   CHOLHDL 6.4 03/01/2022 2013   VLDL 46 (H) 03/01/2022 2013   LDLCALC 142 (H) 03/01/2022 2013     Many patients benefit from treatment even if their cholesterol is at goal. Goal: Total Cholesterol (CHOL) less than 160 Goal:   Triglycerides (TRIG) less than 150 Goal:  HDL greater than 40 Goal:  LDL (LDLCALC) less than 100   BLOOD PRESSURE American Stroke Association blood pressure target is less that 120/80 mm/Hg  Your discharge blood pressure is:    Monitor your blood pressure Limit your salt and alcohol intake Many individuals will require more than one medication for high blood pressure  DIABETES (A1c is a blood sugar average for last 3 months) Goal HGBA1c is under 7% (HBGA1c is blood sugar average for last 3 months)  Diabetes: No known diagnosis of diabetes    Lab Results  Component Value Date   HGBA1C 5.1 03/03/2022    Your HGBA1c can be lowered with medications, healthy diet, and exercise. Check your blood sugar as directed by your physician Call your physician if you experience unexplained or low blood sugars.  PHYSICAL ACTIVITY/REHABILITATION Goal is 30 minutes at least 4 days per week  Activity: Increase activity slowly, Therapies: Physical Therapy: Home Health Return to work:  Activity decreases your risk of heart attack and stroke and makes your heart stronger.  It helps control your weight and blood pressure; helps you relax and can improve your mood. Participate in a regular exercise program. Talk with your doctor about the best form of exercise for you (dancing, walking, swimming, cycling).  DIET/WEIGHT Goal is to maintain a healthy weight  Your discharge diet is:  Diet  Order     None       liquids Your height is:    Your current weight is:   Your Body Mass Index (BMI) is:    Following the type of diet specifically designed for you will help prevent another stroke. Your goal weight range is:   Your goal Body Mass Index (BMI) is 19-24. Healthy food habits can help reduce 3 risk factors for stroke:  High cholesterol, hypertension, and excess weight.  RESOURCES Stroke/Support Group:  Call [phone]   STROKE EDUCATION PROVIDED/REVIEWED AND GIVEN TO PATIENT Stroke warning signs and  symptoms How to activate emergency medical system (call 911). Medications prescribed at discharge. Need for follow-up after discharge. Personal risk factors for stroke. Pneumonia vaccine given: No Flu vaccine given: No My questions have been answered, the writing is legible, and I understand these instructions.  I will adhere to these goals & educational materials that have been provided to me after my discharge from the hospital.      My questions have been answered and I understand these instructions. I will adhere to these goals and the provided educational materials after my discharge from the hospital.  Patient/Caregiver Signature _______________________________ Date __________  Clinician Signature _______________________________________ Date __________  Please bring this form and your medication list with you to all your follow-up doctor's appointments.

## 2022-03-05 NOTE — Discharge Summary (Signed)
Physician Discharge Summary  Charles Farley ATF:[id number] DOB: 1971-01-13  PCP: Lance Bosch, NP  Admitted from: Home Discharged to: [facility]  Admit date: 03/01/2022 Discharge date: 03/05/2022  Recommendations for Outpatient Follow-up:    Follow-up Information     Dohmeier, Porfirio Mylar, MD. Schedule an appointment as soon as possible for a visit in 2 week(s).   Specialty: Neurology Contact information: [address] [place] [phone]         Lance Bosch, NP Follow up.   Specialty: Nurse Practitioner Why: Arrange for follow-up to be seen upon discharge from [facility]. Contact information: [address] [place] [address] [phone]                  Home Health: None    Equipment/Devices: TBD at CIR    Discharge Condition: Improved and stable   Code Status: Full Code Diet recommendation:  Discharge Diet Orders (From admission, onward)     Start     Ordered   03/05/22 0000  Diet - low sodium heart healthy        03/05/22 1045             Discharge Diagnoses:  Principal Problem:   Stroke-like symptoms Active Problems:   HTN (hypertension)   HLD (hyperlipidemia)   Acute CVA (cerebrovascular accident) [facility])   Brief Summary: 51 year old Caucasian male with a past medical history of essential hypertension though he is noncompliant with his medications, obesity, hyperlipidemia presented with left arm weakness.  Also complained of heaviness of the left lower extremity.  Patient tested positive for COVID-19 11 days prior to admission.  Does not have any respiratory symptoms currently.  Hospitalized for further management.  Assessment and plan: Kindly also refer to the progress note done by this MD dated for today.    Acute stroke with left-sided weakness Patient had strokelike symptoms.  Patient was seen by telemetry neurology at [facility].  CT angiogram head and neck did not show any significant  stenosis. MRI brain revealed an acute stroke LDL is 142.  HbA1c 5.1.  HIV nonreactive. Patient on rosuvastatin. Echocardiogram shows normal left ventricular systolic function with grade 1 diastolic dysfunction. Aspirin and Plavix recommended by neurology for 3 weeks followed by aspirin alone. Please review neurology notes for further details. Seen by physical therapy and inpatient rehabilitation is recommended.   Hyperlipidemia Noted to be on rosuvastatin.   Essential hypertension Currently allowing permissive hypertension. May not have been compliant with his antihypertensives.   Consider starting antihypertensives after he is over his acute phase of stroke.   Hypokalemia Repleted.  Will give additional dose today.  Magnesium was 2.1.   Obesity Estimated body mass index is 42.72 kg/m as calculated from the following:   Height as of this encounter: 6' (1.829 m).   Weight as of this encounter: 142.9 kg.     Consultations: Neurology  Procedures: None   Discharge Instructions  Discharge Instructions     Ambulatory referral to Neurology   Complete by: As directed    Follow up with Dr. Vickey Huger at [facility] in 2 weeks for stroke follow-up and OSA evaluation. Thanks.   Call MD for:   Complete by: As directed    Recurrent or new strokelike symptoms.   Diet - low sodium heart healthy   Complete by: As directed    Increase activity slowly   Complete by: As directed         Medication List  STOP taking these medications    DIOVAN HCT PO   valsartan 320 MG tablet Commonly known as: DIOVAN   Vyvanse 50 MG capsule Generic drug: lisdexamfetamine       TAKE these medications    aspirin EC 81 MG tablet Take 1 tablet (81 mg total) by mouth daily. Swallow whole. Start taking on: March 06, 2022   clopidogrel 75 MG tablet Commonly known as: PLAVIX Take 1 tablet (75 mg total) by mouth daily for 18 days. He will need a prescription for the remainder of the meds  upon discharge from CIR. Start taking on: March 06, 2022   rosuvastatin 40 MG tablet Commonly known as: CRESTOR Take 1 tablet (40 mg total) by mouth daily.       No Known Allergies    Procedures/Studies: ECHOCARDIOGRAM COMPLETE  Result Date: 03/03/2022    ECHOCARDIOGRAM REPORT   Patient Name:   Charles Farley Date of Exam: 03/03/2022 Medical Rec #:  [id number]     Height:       72.0 in Accession #:    [id number]    Weight:       315.0 lb Date of Birth:  [birth year]     BSA:          2.584 m Patient Age:    51 years      BP:           176/89 mmHg Patient Gender: M             HR:           69 bpm. Exam Location:  Inpatient Procedure: 2D Echo, Cardiac Doppler, Color Doppler and Intracardiac            Opacification Agent Indications:    Stroke I63.9  History:        Patient has no prior history of Echocardiogram examinations.                 Risk Factors:Hypertension, Dyslipidemia and Non-Smoker.  Sonographer:    Aron Baba Referring Phys: [phone] JARED M GARDNER  Sonographer Comments: Technically difficult study due to poor echo windows, no subcostal window and patient is obese. Image acquisition challenging due to respiratory motion. IMPRESSIONS  1. Left ventricular ejection fraction, by estimation, is 60 to 65%. The left ventricle has normal function. The left ventricle has no regional wall motion abnormalities. There is mild concentric left ventricular hypertrophy. Left ventricular diastolic parameters are consistent with Grade I diastolic dysfunction (impaired relaxation).  2. Right ventricular systolic function is normal. The right ventricular size is normal. Tricuspid regurgitation signal is inadequate for assessing PA pressure.  3. The mitral valve is normal in structure. Trivial mitral valve regurgitation. No evidence of mitral stenosis.  4. The aortic valve is tricuspid. Aortic valve regurgitation is mild. No aortic stenosis is present.  5. The inferior vena cava is normal in size with  greater than 50% respiratory variability, suggesting right atrial pressure of 3 mmHg. Comparison(s): No prior Echocardiogram. Conclusion(s)/Recommendation(s): No intracardiac source of embolism detected on this transthoracic study. Consider a transesophageal echocardiogram to exclude cardiac source of embolism if clinically indicated. FINDINGS  Left Ventricle: Left ventricular ejection fraction, by estimation, is 60 to 65%. The left ventricle has normal function. The left ventricle has no regional wall motion abnormalities. Definity contrast agent was given IV to delineate the left ventricular  endocardial borders. The left ventricular internal cavity size was normal in size. There is mild concentric left ventricular hypertrophy. Left  ventricular diastolic parameters are consistent with Grade I diastolic dysfunction (impaired relaxation). Right Ventricle: The right ventricular size is normal. No increase in right ventricular wall thickness. Right ventricular systolic function is normal. Tricuspid regurgitation signal is inadequate for assessing PA pressure. Left Atrium: Left atrial size was normal in size. Right Atrium: Right atrial size was normal in size. Pericardium: There is no evidence of pericardial effusion. Mitral Valve: The mitral valve is normal in structure. Trivial mitral valve regurgitation. No evidence of mitral valve stenosis. Tricuspid Valve: The tricuspid valve is normal in structure. Tricuspid valve regurgitation is trivial. Aortic Valve: The aortic valve is tricuspid. Aortic valve regurgitation is mild. No aortic stenosis is present. Pulmonic Valve: The pulmonic valve was normal in structure. Pulmonic valve regurgitation is trivial. Aorta: The aortic root and ascending aorta are structurally normal, with no evidence of dilitation. Venous: The inferior vena cava is normal in size with greater than 50% respiratory variability, suggesting right atrial pressure of 3 mmHg. IAS/Shunts: The atrial septum  is grossly normal.  LEFT VENTRICLE PLAX 2D LVIDd:         5.10 cm   Diastology LVIDs:         4.00 cm   LV e' medial:    5.26 cm/s LV PW:         1.00 cm   LV E/e' medial:  13.5 LV IVS:        1.00 cm   LV e' lateral:   9.90 cm/s LVOT diam:     2.20 cm   LV E/e' lateral: 7.2 LV SV:         81 LV SV Index:   31 LVOT Area:     3.80 cm  RIGHT VENTRICLE RV S prime:     19.60 cm/s TAPSE (M-mode): 2.6 cm LEFT ATRIUM             Index        RIGHT ATRIUM           Index LA diam:        3.80 cm 1.47 cm/m   RA Area:     17.20 cm LA Vol (A2C):   42.9 ml 16.60 ml/m  RA Volume:   44.60 ml  17.26 ml/m LA Vol (A4C):   46.2 ml 17.88 ml/m LA Biplane Vol: 45.1 ml 17.45 ml/m  AORTIC VALVE LVOT Vmax:   111.00 cm/s LVOT Vmean:  71.800 cm/s LVOT VTI:    0.212 m  AORTA Ao Root diam: 3.60 cm Ao Asc diam:  3.40 cm MITRAL VALVE MV Area (PHT): 2.87 cm    SHUNTS MV Decel Time: 264 msec    Systemic VTI:  0.21 m MV E velocity: 71.00 cm/s  Systemic Diam: 2.20 cm MV A velocity: 97.80 cm/s MV E/A ratio:  0.73 Laurance FlattenHeather Pemberton MD Electronically signed by Laurance FlattenHeather Pemberton MD Signature Date/Time: 03/03/2022/11:44:33 AM    Final    MR BRAIN WO CONTRAST  Result Date: 03/03/2022 CLINICAL DATA:  Stroke follow-up. EXAM: MRI HEAD WITHOUT CONTRAST TECHNIQUE: Multiplanar, multiecho pulse sequences of the brain and surrounding structures were obtained without intravenous contrast. COMPARISON:  CT head 03/01/22 FINDINGS: Brain: Acute infarct in the posterior limb of the right internal capsule (series 2, image 22). No evidence of hemorrhage. No extra-axial fluid collection. There is sequela of moderate chronic microvascular ischemic change. No hydrocephalus. Vascular: Normal flow voids. Skull and upper cervical spine: Normal marrow signal. Sinuses/Orbits: Bilateral maxillary sinus mucosal thickening. Other: None. IMPRESSION: Acute infarct  in the posterior limb of the right internal capsule. No hemorrhage. Electronically Signed   By: Marin Roberts  M.D.   On: 03/03/2022 08:31   CT ANGIO HEAD NECK W WO CM  Result Date: 03/01/2022 : Ordering Physician: Sherri Rad, MD. At time of dictation, Ulyses Jarred is erroneously listed in the exam header as the ordering physician. The ordering provider information is correct in Epic. CLINICAL DATA:  Left-sided weakness with expressive aphasia EXAM: CT ANGIOGRAPHY HEAD AND NECK TECHNIQUE: Multidetector CT imaging of the head and neck was performed using the standard protocol during bolus administration of intravenous contrast. Multiplanar CT image reconstructions and MIPs were obtained to evaluate the vascular anatomy. Carotid stenosis measurements (when applicable) are obtained utilizing NASCET criteria, using the distal internal carotid diameter as the denominator. RADIATION DOSE REDUCTION: This exam was performed according to the departmental dose-optimization program which includes automated exposure control, adjustment of the mA and/or kV according to patient size and/or use of iterative reconstruction technique. CONTRAST:  46mL OMNIPAQUE IOHEXOL 350 MG/ML SOLN COMPARISON:  None Available. FINDINGS: CTA NECK FINDINGS SKELETON: There is no bony spinal canal stenosis. No lytic or blastic lesion. OTHER NECK: Normal pharynx, larynx and major salivary glands. No cervical lymphadenopathy. Unremarkable thyroid gland. UPPER CHEST: No pneumothorax or pleural effusion. No nodules or masses. AORTIC ARCH: There is no calcific atherosclerosis of the aortic arch. There is no aneurysm, dissection or hemodynamically significant stenosis of the visualized portion of the aorta. Conventional 3 vessel aortic branching pattern. The visualized proximal subclavian arteries are widely patent. RIGHT CAROTID SYSTEM: Normal without aneurysm, dissection or stenosis. LEFT CAROTID SYSTEM: Normal without aneurysm, dissection or stenosis. VERTEBRAL ARTERIES: Right dominant configuration. Both origins are clearly patent. There is no dissection,  occlusion or flow-limiting stenosis to the skull base (V1-V3 segments). CTA HEAD FINDINGS POSTERIOR CIRCULATION: --Vertebral arteries: Normal V4 segments. --Inferior cerebellar arteries: Normal. --Basilar artery: Normal. --Superior cerebellar arteries: Normal. --Posterior cerebral arteries (PCA): Normal. ANTERIOR CIRCULATION: --Intracranial internal carotid arteries: Normal. --Anterior cerebral arteries (ACA): Normal. Both A1 segments are present. Patent anterior communicating artery (a-comm). --Middle cerebral arteries (MCA): Normal. VENOUS SINUSES: As permitted by contrast timing, patent. ANATOMIC VARIANTS: None Review of the MIP images confirms the above findings. IMPRESSION: Normal CTA of the head and neck Electronically Signed   By: Ulyses Jarred M.D.   On: 03/01/2022 23:04   CT HEAD WO CONTRAST  Result Date: 03/01/2022 CLINICAL DATA:  Neuro deficit, acute stroke suspected. Last known well noon today. Heaviness in the left arm and leg. EXAM: CT HEAD WITHOUT CONTRAST TECHNIQUE: Contiguous axial images were obtained from the base of the skull through the vertex without intravenous contrast. RADIATION DOSE REDUCTION: This exam was performed according to the departmental dose-optimization program which includes automated exposure control, adjustment of the mA and/or kV according to patient size and/or use of iterative reconstruction technique. COMPARISON:  None Available. FINDINGS: Brain: No evidence of acute infarction, hemorrhage, hydrocephalus, extra-axial collection or mass lesion/mass effect. Vascular: No hyperdense vessel or unexpected calcification. Skull: Normal. Negative for fracture or focal lesion. Sinuses/Orbits: No acute finding. Other: None. IMPRESSION: No acute intracranial pathology. Aspects score of 10. Electronically Signed   By: Keane Police D.O.   On: 03/01/2022 20:35      Subjective: Improving left sided strength.  No new complaints.  Discharge Exam:  Vitals:   03/04/22 1949  03/04/22 2328 03/05/22 0430 03/05/22 0726  BP: (!) 157/93 (!) 144/78 139/69 (!) 152/72  Pulse: 81 75 61 65  Resp: 16 16 15 19   Temp: 98.2 F (36.8 C) 98 F (36.7 C) (!) 97.4 F (36.3 C) 98.2 F (36.8 C)  TempSrc: Oral Oral Oral Oral  SpO2: 94% 94% 96% 96%  Weight:      Height:        RS: Clear to auscultation.  No increased work of breathing. CVS: S1 and S2 heard, RRR.  No JVD, murmurs or pedal edema.  Telemetry personally reviewed: Sinus rhythm Abdomen: Nondistended, soft and nontender.  Normal bowel sounds heard. CNS: Alert and oriented x3.  No cranial nerve deficits. Extremities: Right extremities grade 5 x 5 power.  Left upper extremity also seems to be grade 5 x 5 power.  Left lower extremity graded 4+ by 5 power.    The results of significant diagnostics from this hospitalization (including imaging, microbiology, ancillary and laboratory) are listed below for reference.     Microbiology: No results found for this or any previous visit (from the past 240 hour(s)).   Labs: CBC: Recent Labs  Lab 03/01/22 2013 03/04/22 0344  WBC 9.2 10.2  NEUTROABS 5.6  --   HGB 14.6 14.9  HCT 42.1 43.7  MCV 83.0 83.7  PLT 297 290    Basic Metabolic Panel: Recent Labs  Lab 03/01/22 2013 03/04/22 0344  NA 140 139  K 3.3* 3.5  CL 106 104  CO2 27 25  GLUCOSE 113* 103*  BUN 16 14  CREATININE 0.98 1.03  CALCIUM 8.4* 8.8*  MG  --  2.1    Liver Function Tests: Recent Labs  Lab 03/01/22 2013  AST 18  ALT 23  ALKPHOS 52  BILITOT 0.6  PROT 7.0  ALBUMIN 3.9    Hgb A1c Recent Labs    03/03/22 0444  HGBA1C 5.1     Urinalysis    Component Value Date/Time   COLORURINE YELLOW 03/02/2022 0008   APPEARANCEUR CLEAR 03/02/2022 0008   LABSPEC 1.015 03/02/2022 0008   PHURINE 7.0 03/02/2022 0008   GLUCOSEU NEGATIVE 03/02/2022 0008   HGBUR NEGATIVE 03/02/2022 0008   BILIRUBINUR NEGATIVE 03/02/2022 0008   KETONESUR NEGATIVE 03/02/2022 0008   PROTEINUR NEGATIVE  03/02/2022 0008   NITRITE NEGATIVE 03/02/2022 0008   LEUKOCYTESUR NEGATIVE 03/02/2022 0008      Time coordinating discharge: 15 minutes  SIGNED:  03/04/2022, MD,  FACP, FHM, SFHM, [facility], [facility]   [facility] Phys[facility] Advisor [place]     To contact the attending provider between 7A-7P or the covering provider during after hours 7P-7A, please log into the web site [email or web address] and access using universal [place] password for that web site. If you do not have the password, please call the hospital operator.

## 2022-03-05 NOTE — H&P (Signed)
Physical Medicine and Rehabilitation Admission H&P        Chief Complaint  Patient presents with   Extremity Weakness  : HPI: Charles Farley is a 51 year old right-handed male with history of hypertension, hyperlipidemia, ADHD, obesity with BMI 42.72, recent COVID positive as well as questionable medical compliance to valsartan as well as Crestor.  Patient says he lives with spouse.  Two-level home 3 steps to entry.  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  Wife is a Publishing rights managernurse practitioner.  Presented 03/01/2022 with left-sided numbness, weakness and word finding difficulty.  Blood pressure 172/79.  Cranial CT scan negative, CT angiogram head and neck unremarkable.  Patient did not receive tPA.  Admission chemistries unremarkable except potassium 3.3, glucose 113, cholesterol 223, triglyceride 232, urine drug screen and alcohol negative.  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  Echocardiogram with ejection fraction of 60 to 65% no wall motion abnormalities grade 1 diastolic dysfunction.  Neurology follow-up placed on aspirin 81 mg daily and Plavix 75 mg daily x3 weeks then aspirin alone.  Lovenox added for DVT prophylaxis.  Tolerating a regular diet.  He reports his LUE and LLE numbness has resolved. He continues to have difficulty with fine motor movements in his LUE and mild weakness in his LLE. Reports symptoms of COVID-19 have resolved other than mild chest congestion.  Therapy evaluations completed due to patient's left-sided weakness/word finding difficulties was admitted for a comprehensive rehab program.   Review of Systems  Constitutional:  Negative for chills and fever.  HENT:  Negative for hearing loss.   Eyes:  Negative for blurred vision and double vision.  Respiratory:  Negative for cough and shortness of breath.   Cardiovascular:  Negative for chest pain, palpitations and leg swelling.  Gastrointestinal:  Negative for abdominal pain,  constipation, diarrhea, heartburn, nausea and vomiting.  Genitourinary:  Negative for dysuria, flank pain and hematuria.  Musculoskeletal:  Positive for myalgias.  Skin:  Negative for rash.  Neurological:  Positive for sensory change, speech change and weakness. Negative for headaches.  All other systems reviewed and are negative.       Past Medical History:  Diagnosis Date   ADHD     Hypertension      No past surgical history on file. No family history on file. Social History:  reports that he has never smoked. He has never used smokeless tobacco. He reports that he does not currently use alcohol. He reports that he does not use drugs. Allergies: No Known Allergies       Medications Prior to Admission  Medication Sig Dispense Refill   rosuvastatin (CRESTOR) 40 MG tablet TAKE 1 TABLET BY MOUTH DAILY FOR CHOLESTEROL (Patient taking differently: Take 40 mg by mouth daily.) 90 tablet 3   Valsartan-hydroCHLOROthiazide (DIOVAN HCT PO) Take 1 tablet by mouth daily.       lisdexamfetamine (VYVANSE) 50 MG capsule TAKE 1 CAPSULE BY MOUTH ONCE A DAY (Patient not taking: Reported on 03/03/2022) 30 capsule 0   valsartan (DIOVAN) 320 MG tablet TAKE ONE TABLET BY MOUTH DAILY FOR BLOOD PRESSURE. (Patient not taking: Reported on 03/03/2022) 90 tablet 3          Home: Home Living Family/patient expects to be discharged to:: Private residence Living Arrangements: Spouse/significant other Available Help at Discharge: Family, Available PRN/intermittently (wife is a NP in [place]) Type of Home: House Home Access: Stairs to enter Enter[employer] CorporationEntrance Stairs-Number of Steps: 3  Entrance Stairs-Rails: Can reach both Home Layout: Two level Alternate Level Stairs-Number of Steps: flight Alternate Level Stairs-Rails: Right Bathroom Shower/Tub: Health visitor: Standard Bathroom Accessibility: Yes Home Equipment: None  Lives With: Spouse   Functional History: Prior Function Prior Level of  Function : Independent/Modified Independent, Driving, Working/employed Mobility Comments: indep, 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 ADLs Comments: enjoys reading and watching TV   Functional Status:  Mobility: Bed Mobility Overal bed mobility: Needs Assistance Bed Mobility: Sit to Supine Rolling: Min guard Sidelying to sit: Min assist Sit to supine: Min guard General bed mobility comments: Pt states it is difficult to lift his L leg onto the bed Transfers Overall transfer level: Needs assistance Equipment used: None Transfers: Sit to/from Stand Sit to Stand: Min guard General transfer comment: minA to power up, pt with L lateral lean Ambulation/Gait Ambulation/Gait assistance: Mod assist Gait Distance (Feet): 60 Feet Assistive device: 1 person hand held assist Gait Pattern/deviations: Step-through pattern, Decreased stride length, Ataxic, Decreased weight shift to right, Decreased dorsiflexion - left, Knees buckling, Drifts right/left General Gait Details: pt with L lateral bias, mild L LE ataxia with noted L knee instability/buckling, modA at L lateral trunk to promote R lateral weight shift, pt with decreased L step height borderline shuffle, pt aware Gait velocity: dec compared to baseline Gait velocity interpretation: <1.8 ft/sec, indicate of risk for recurrent falls   ADL: ADL Overall ADL's : Needs assistance/impaired Grooming: Supervision/safety Upper Body Bathing: Set up, Supervision/ safety, Sitting Lower Body Bathing: Minimal assistance, Sit to/from stand Upper Body Dressing : Minimal assistance Lower Body Dressing: Minimal assistance, Sit to/from stand Toilet Transfer: Minimal assistance, Ambulation Toileting- Clothing Manipulation and Hygiene: Minimal assistance, Sit to/from stand Functional mobility during ADLs: Minimal assistance, Rolling walker (2 wheels) General ADL Comments: feels he does better with use of a RW   Cognition: Cognition Overall Cognitive  Status: Within Functional Limits for tasks assessed Arousal/Alertness: Awake/alert Orientation Level: Oriented X4 Year: 2023 Day of Week: Correct Attention: Focused Focused Attention: Appears intact Memory: Appears intact Awareness: Appears intact Problem Solving: Appears intact Cognition Arousal/Alertness: Awake/alert Behavior During Therapy: WFL for tasks assessed/performed Overall Cognitive Status: Within Functional Limits for tasks assessed General Comments: pt aware of symptoms and deficits, appears intact; will continue to assess   Physical Exam: Blood pressure (!) 149/67, pulse 72, temperature 98 F (36.7 C), temperature source Oral, resp. rate 19, height 6' (1.829 m), weight (!) 142.9 kg, SpO2 96 %.   General: No apparent distress HEENT: Head is normocephalic, atraumatic, PERRLA, EOMI, sclera anicteric, oral mucosa pink and moist, dentition intact, ext ear canals clear,  Neck: Supple without JVD or lymphadenopathy Heart: Reg rate and rhythm. No murmurs rubs or gallops Chest: CTA bilaterally without wheezes, rales, or rhonchi; no distress Abdomen: Soft, non-tender, non-distended, bowel sounds positive. Obese Extremities: No clubbing, cyanosis, or edema. Pulses are 2+ Psych: Pt's affect is appropriate. Pt is cooperative Skin: Clean and intact without signs of breakdown Neuro:  Patient is alert and oriented x4.   Makes eye contact with examiner.  Speech is fluent, no Language deficits noted.  Good insight and awareness.  CN 2-12 intact. Follows commands.  Sensation intact to LT and cold in all 4 extremities Strength 5/5 in B/L UE Strength 5/5 in RLE Strength 4+/5 L hip flexion, 5/5 knee extension, 4+/5 ankle PF and DF Slight palmar drift LUE FTN slightly altered LUE  Decreased fine motor movements LUE Musculoskeletal: No joint swelling or tenderness noted, No  abnormal tone, normal muscle bulk      Lab Results Last 48 Hours        Results for orders placed or performed  during the hospital encounter of 03/01/22 (from the past 48 hour(s))  HIV Antibody (routine testing w rflx)     Status: None    Collection Time: 03/03/22  4:44 AM  Result Value Ref Range    HIV Screen 4th Generation wRfx Non Reactive Non Reactive      Comment: Performed at [facility], [address], [place], [place] [address]  Hemoglobin A1c     Status: None    Collection Time: 03/03/22  4:44 AM  Result Value Ref Range    Hgb A1c MFr Bld 5.1 4.8 - 5.6 %      Comment: (NOTE) Pre diabetes:          5.7%-6.4%   Diabetes:              >6.4%   Glycemic control for   <7.0% adults with diabetes      Mean Plasma Glucose 99.67 mg/dL      Comment: Performed at [facility], [address], [place], [place] [address]  CBC     Status: None    Collection Time: 03/04/22  3:44 AM  Result Value Ref Range    WBC 10.2 4.0 - 10.5 K/uL    RBC 5.22 4.22 - 5.81 MIL/uL    Hemoglobin 14.9 13.0 - 17.0 g/dL    HCT 51.8 84.1 - 66.0 %    MCV 83.7 80.0 - 100.0 fL    MCH 28.5 26.0 - 34.0 pg    MCHC 34.1 30.0 - 36.0 g/dL    RDW 63.0 16.0 - 10.9 %    Platelets 290 150 - 400 K/uL    nRBC 0.0 0.0 - 0.2 %      Comment: Performed at [facility], [address], [place], [place] [address]  Basic metabolic panel     Status: Abnormal    Collection Time: 03/04/22  3:44 AM  Result Value Ref Range    Sodium 139 135 - 145 mmol/L    Potassium 3.5 3.5 - 5.1 mmol/L    Chloride 104 98 - 111 mmol/L    CO2 25 22 - 32 mmol/L    Glucose, Bld 103 (H) 70 - 99 mg/dL      Comment: Glucose reference range applies only to samples taken after fasting for at least 8 hours.    BUN 14 6 - 20 mg/dL    Creatinine, Ser 7.32 0.61 - 1.24 mg/dL    Calcium 8.8 (L) 8.9 - 10.3 mg/dL    GFR, Estimated >20 >25 mL/min      Comment: (NOTE) Calculated using the CKD-EPI Creatinine Equation (2021)      Anion gap 10 5 - 15      Comment: Performed at [facility], [address] [place], [place] [address]   Magnesium     Status: None    Collection Time: 03/04/22  3:44 AM  Result Value Ref Range    Magnesium 2.1 1.7 - 2.4 mg/dL      Comment: Performed at [facility], [address] [place], [place] [address]       Imaging Results (Last 48 hours)  ECHOCARDIOGRAM COMPLETE   Result Date: 03/03/2022    ECHOCARDIOGRAM REPORT   Patient Name:   Charles Farley Date of Exam:  03/03/2022 Medical Rec #:  [id number]     Height:       72.0 in Accession #:    [id number]    Weight:       315.0 lb Date of Birth:  08-Nov-1970     BSA:          2.584 m Patient Age:    51 years      BP:           176/89 mmHg Patient Gender: M             HR:           69 bpm. Exam Location:  Inpatient Procedure: 2D Echo, Cardiac Doppler, Color Doppler and Intracardiac            Opacification Agent Indications:    Stroke I63.9  History:        Patient has no prior history of Echocardiogram examinations.                 Risk Factors:Hypertension, Dyslipidemia and Non-Smoker.  Sonographer:    Aron Baba Referring Phys: [phone] JARED M GARDNER  Sonographer Comments: Technically difficult study due to poor echo windows, no subcostal window and patient is obese. Image acquisition challenging due to respiratory motion. IMPRESSIONS  1. Left ventricular ejection fraction, by estimation, is 60 to 65%. The left ventricle has normal function. The left ventricle has no regional wall motion abnormalities. There is mild concentric left ventricular hypertrophy. Left ventricular diastolic parameters are consistent with Grade I diastolic dysfunction (impaired relaxation).  2. Right ventricular systolic function is normal. The right ventricular size is normal. Tricuspid regurgitation signal is inadequate for assessing PA pressure.  3. The mitral valve is normal in structure. Trivial mitral valve regurgitation. No evidence of mitral stenosis.  4. The aortic valve is tricuspid. Aortic valve regurgitation is mild. No aortic stenosis is present.  5. The  inferior vena cava is normal in size with greater than 50% respiratory variability, suggesting right atrial pressure of 3 mmHg. Comparison(s): No prior Echocardiogram. Conclusion(s)/Recommendation(s): No intracardiac source of embolism detected on this transthoracic study. Consider a transesophageal echocardiogram to exclude cardiac source of embolism if clinically indicated. FINDINGS  Left Ventricle: Left ventricular ejection fraction, by estimation, is 60 to 65%. The left ventricle has normal function. The left ventricle has no regional wall motion abnormalities. Definity contrast agent was given IV to delineate the left ventricular  endocardial borders. The left ventricular internal cavity size was normal in size. There is mild concentric left ventricular hypertrophy. Left ventricular diastolic parameters are consistent with Grade I diastolic dysfunction (impaired relaxation). Right Ventricle: The right ventricular size is normal. No increase in right ventricular wall thickness. Right ventricular systolic function is normal. Tricuspid regurgitation signal is inadequate for assessing PA pressure. Left Atrium: Left atrial size was normal in size. Right Atrium: Right atrial size was normal in size. Pericardium: There is no evidence of pericardial effusion. Mitral Valve: The mitral valve is normal in structure. Trivial mitral valve regurgitation. No evidence of mitral valve stenosis. Tricuspid Valve: The tricuspid valve is normal in structure. Tricuspid valve regurgitation is trivial. Aortic Valve: The aortic valve is tricuspid. Aortic valve regurgitation is mild. No aortic stenosis is present. Pulmonic Valve: The pulmonic valve was normal in structure. Pulmonic valve regurgitation is trivial. Aorta: The aortic root and ascending aorta are structurally normal, with no evidence of dilitation. Venous: The inferior vena cava is normal in size with greater than 50% respiratory variability,  suggesting right atrial  pressure of 3 mmHg. IAS/Shunts: The atrial septum is grossly normal.  LEFT VENTRICLE PLAX 2D LVIDd:         5.10 cm   Diastology LVIDs:         4.00 cm   LV e' medial:    5.26 cm/s LV PW:         1.00 cm   LV E/e' medial:  13.5 LV IVS:        1.00 cm   LV e' lateral:   9.90 cm/s LVOT diam:     2.20 cm   LV E/e' lateral: 7.2 LV SV:         81 LV SV Index:   31 LVOT Area:     3.80 cm  RIGHT VENTRICLE RV S prime:     19.60 cm/s TAPSE (M-mode): 2.6 cm LEFT ATRIUM             Index        RIGHT ATRIUM           Index LA diam:        3.80 cm 1.47 cm/m   RA Area:     17.20 cm LA Vol (A2C):   42.9 ml 16.60 ml/m  RA Volume:   44.60 ml  17.26 ml/m LA Vol (A4C):   46.2 ml 17.88 ml/m LA Biplane Vol: 45.1 ml 17.45 ml/m  AORTIC VALVE LVOT Vmax:   111.00 cm/s LVOT Vmean:  71.800 cm/s LVOT VTI:    0.212 m  AORTA Ao Root diam: 3.60 cm Ao Asc diam:  3.40 cm MITRAL VALVE MV Area (PHT): 2.87 cm    SHUNTS MV Decel Time: 264 msec    Systemic VTI:  0.21 m MV E velocity: 71.00 cm/s  Systemic Diam: 2.20 cm MV A velocity: 97.80 cm/s MV E/A ratio:  0.73 Gwyndolyn Kaufman MD Electronically signed by Gwyndolyn Kaufman MD Signature Date/Time: 03/03/2022/11:44:33 AM    Final     MR BRAIN WO CONTRAST   Result Date: 03/03/2022 CLINICAL DATA:  Stroke follow-up. EXAM: MRI HEAD WITHOUT CONTRAST TECHNIQUE: Multiplanar, multiecho pulse sequences of the brain and surrounding structures were obtained without intravenous contrast. COMPARISON:  CT head 03/01/22 FINDINGS: Brain: Acute infarct in the posterior limb of the right internal capsule (series 2, image 22). No evidence of hemorrhage. No extra-axial fluid collection. There is sequela of moderate chronic microvascular ischemic change. No hydrocephalus. Vascular: Normal flow voids. Skull and upper cervical spine: Normal marrow signal. Sinuses/Orbits: Bilateral maxillary sinus mucosal thickening. Other: None. IMPRESSION: Acute infarct in the posterior limb of the right internal capsule. No  hemorrhage. Electronically Signed   By: Marin Roberts M.D.   On: 03/03/2022 08:31           Blood pressure (!) 149/67, pulse 72, temperature 98 F (36.7 C), temperature source Oral, resp. rate 19, height 6' (1.829 m), weight (!) 142.9 kg, SpO2 96 %.   Medical Problem List and Plan: 1. 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             -patient may shower             -ELOS/Goals: 3-5 days, Mod I             -Admit to CIR 2.  Antithrombotics: -DVT/anticoagulation:  Pharmaceutical: Lovenox             -antiplatelet therapy: Aspirin 81 mg  daily and Plavix 75 mg daily x3 weeks then aspirin alone 3. Pain Management: Tylenol as needed 4. Mood/Behavior/Sleep: Provide emotional support             -antipsychotic agents: N/A 5. Neuropsych/cognition: This patient is capable of making decisions on his own behalf. 6. Skin/Wound Care: Routine skin checks 7. Fluids/Electrolytes/Nutrition: Routine in and outs with follow-up chemistries 8.  Permissive hypertension.  Presently on no antihypertensive medications.  Patient on Diovan 160 mg daily , HCTZ 12.5 mg daily prior to admission.  Resume as needed. Avoid hypotension 9.  Hyperlipidemia.  Heart healthy diet, Crestor 40 mg daily 10.  ADHD.  Resume Vyvanse on discharge 11.  Obesity.  BMI 42.72.  Dietary follow-up 12.  Recent COVID-19 infection.  Patient asymptomatic other than mild chest congestion             -Start guaifenesin PRN 13. Hypokalemia             -Recheck CMP       Charlton Amor, PA-C 03/04/2022   I have personally performed a face to face diagnostic evaluation of this patient and formulated the key components of the plan.  Additionally, I have personally reviewed laboratory data, imaging studies, as well as relevant notes and concur with the physician assistant's documentation above.  The patient's status has not changed from the original H&P.   Any changes in documentation from the acute care chart have been noted above.  Fanny Dance, MD, [place]

## 2022-03-05 NOTE — Plan of Care (Signed)
?  Problem: Education: ?Goal: Knowledge of disease or condition will improve ?Outcome: Progressing ?Goal: Knowledge of secondary prevention will improve (SELECT ALL) ?Outcome: Progressing ?Goal: Knowledge of patient specific risk factors will improve (INDIVIDUALIZE FOR PATIENT) ?Outcome: Progressing ?  ?

## 2022-03-05 NOTE — TOC Transition Note (Signed)
Transition of Care [facility]) - CM/SW Discharge Note   Patient Details  Name: Charles Farley MRN: [id number] Date of Birth: [birth year]  [facility] CM/SW Contact:  Pollie Friar, RN Phone Number: 03/05/2022, 9:50 AM   Clinical Narrative:    Pt is discharging to CIR today. CM signing off.    Final next level of care: IP Rehab Facility Barriers to Discharge: No Barriers Identified   Patient Goals and CMS Choice   CMS Medicare.gov Compare Post Acute Care list provided to:: Patient Choice offered to / list presented to : Patient  Discharge Placement                       Discharge Plan and Services   Discharge Planning Services: CM Consult Post Acute Care Choice: IP Rehab                               Social Determinants of Health (SDOH) Interventions     Readmission Risk Interventions     No data to display

## 2022-03-05 NOTE — Progress Notes (Deleted)
Inpatient Rehabilitation Admission Medication Review by a Pharmacist  A complete drug regimen review was completed for this patient to identify any potential clinically significant medication issues.  High Risk Drug Classes Is patient taking? Indication by Medication  Antipsychotic No   Anticoagulant Yes Lovenox-VTE px  Antibiotic No   Opioid No   Antiplatelet Yes Plavix x 18 days with aspirin, then aspirin alone thereafter  Hypoglycemics/insulin No   Vasoactive Medication No   Chemotherapy No   Other Yes Crestor-HLD     Type of Medication Issue Identified Description of Issue Recommendation(s)  Drug Interaction(s) (clinically significant)     Duplicate Therapy     Allergy     No Medication Administration End Date     Incorrect Dose     Additional Drug Therapy Needed     Significant med changes from prior encounter (inform family/care partners about these prior to discharge).    Other       Clinically significant medication issues were identified that warrant physician communication and completion of prescribed/recommended actions by midnight of the next day:  No  Name of provider notified for urgent issues identified:   Provider Method of Notification:     Pharmacist comments:   Time spent performing this drug regimen review (minutes):  10   Dwayne A. Levada Dy, PharmD, BCPS, FNKF Clinical Pharmacist [place] Please utilize Amion for appropriate phone number to reach the unit pharmacist ([place])  03/05/2022 12:35 PM

## 2022-03-06 LAB — COMPREHENSIVE METABOLIC PANEL
ALT: 26 U/L (ref 0–44)
AST: 22 U/L (ref 15–41)
Albumin: 3.6 g/dL (ref 3.5–5.0)
Alkaline Phosphatase: 50 U/L (ref 38–126)
Anion gap: 10 (ref 5–15)
BUN: 17 mg/dL (ref 6–20)
CO2: 24 mmol/L (ref 22–32)
Calcium: 8.8 mg/dL — ABNORMAL LOW (ref 8.9–10.3)
Chloride: 104 mmol/L (ref 98–111)
Creatinine, Ser: 1.11 mg/dL (ref 0.61–1.24)
GFR, Estimated: >60 mL/min (ref >60)
Glucose, Bld: 101 mg/dL — ABNORMAL HIGH (ref 70–99)
Potassium: 3.5 mmol/L (ref 3.5–5.1)
Sodium: 138 mmol/L (ref 135–145)
Total Bilirubin: 0.9 mg/dL (ref 0.3–1.2)
Total Protein: 6.3 g/dL — ABNORMAL LOW (ref 6.5–8.1)

## 2022-03-06 LAB — CBC WITH DIFFERENTIAL/PLATELET
Abs Immature Granulocytes: 0.04 10*3/uL (ref 0.00–0.07)
Basophils Absolute: 0.1 10*3/uL (ref 0.0–0.1)
Basophils Relative: 1 %
Eosinophils Absolute: 0.3 10*3/uL (ref 0.0–0.5)
Eosinophils Relative: 3 %
HCT: 42 % (ref 39.0–52.0)
Hemoglobin: 14.2 g/dL (ref 13.0–17.0)
Immature Granulocytes: 0 %
Lymphocytes Relative: 18 %
Lymphs Abs: 1.7 10*3/uL (ref 0.7–4.0)
MCH: 28.6 pg (ref 26.0–34.0)
MCHC: 33.8 g/dL (ref 30.0–36.0)
MCV: 84.7 fL (ref 80.0–100.0)
Monocytes Absolute: 1 10*3/uL (ref 0.1–1.0)
Monocytes Relative: 10 %
Neutro Abs: 6.5 10*3/uL (ref 1.7–7.7)
Neutrophils Relative %: 68 %
Platelets: 249 10*3/uL (ref 150–400)
RBC: 4.96 MIL/uL (ref 4.22–5.81)
RDW: 13.3 % (ref 11.5–15.5)
WBC: 9.6 10*3/uL (ref 4.0–10.5)
nRBC: 0 % (ref 0.0–0.2)

## 2022-03-06 MED ORDER — EXERCISE FOR HEART AND HEALTH BOOK
Freq: Once | Status: AC
  Filled 2022-03-06: qty 1

## 2022-03-06 MED ORDER — BLOOD PRESSURE CONTROL BOOK
Freq: Once | Status: AC
  Filled 2022-03-06: qty 1

## 2022-03-06 NOTE — Progress Notes (Signed)
Patient ID: Charles Farley, male   DOB: 11-Nov-1970, 51 y.o.   MRN: [id number] Met with the patient to review current situation, team conference, rehab process and plan of care. Discussed secondary risk management including HLD (LDL 142/Trig 232), HTN, along with medications and dietary modification recommendations. DAPT (ASA + Plavix ) x 3 weeks then ASA solo. Continue to follow along to address nursing educational needs to facilitate preparation for discharge home with wife. Margarito Liner

## 2022-03-06 NOTE — Evaluation (Signed)
Physical Therapy Assessment and Plan  Patient Details  Name: Charles Farley MRN: [id number] Date of Birth: [birth year]  PT Diagnosis: Abnormal posture, Abnormality of gait, Coordination disorder, Difficulty walking, Hemiplegia non-dominant, and Muscle weakness Rehab Potential: Excellent ELOS: 4-5 days   Today's Date: 03/06/2022 PT Individual Time: [phone]      Hospital Problem: Principal Problem:   Subcortical infarction [facility])   Past Medical History:  Past Medical History:  Diagnosis Date   ADHD    Hypertension    Past Surgical History: History reviewed. No pertinent surgical history.  Assessment & Plan Clinical Impression: Patient is a 51 year old right-handed male with history of hypertension, hyperlipidemia, ADHD, obesity with BMI 42.72, recent COVID positive as well as questionable medical compliance to valsartan as well as Crestor.  Patient says he lives with spouse.  Two-level home 3 steps to entry.  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  Wife is a Designer, jewellery.  Presented 03/01/2022 with left-sided numbness, weakness and word finding difficulty.  Blood pressure 172/79.  Cranial CT scan negative, CT angiogram head and neck unremarkable.  Patient did not receive tPA.  Admission chemistries unremarkable except potassium 3.3, glucose 113, cholesterol 223, triglyceride 232, urine drug screen and alcohol negative.  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  Echocardiogram with ejection fraction of 60 to 65% no wall motion abnormalities grade 1 diastolic dysfunction.  Neurology follow-up placed on aspirin 81 mg daily and Plavix 75 mg daily x3 weeks then aspirin alone.  Lovenox added for DVT prophylaxis.  Tolerating a regular diet.  He reports his LUE and LLE numbness has resolved. He continues to have difficulty with fine motor movements in his LUE and mild weakness in his LLE. Reports symptoms of COVID-19 have resolved other than mild chest  congestion.  Therapy evaluations completed due to patient's left-sided weakness/word finding difficulties was admitted for a comprehensive rehab program. Patient transferred to CIR on 03/05/2022 .   Patient currently requires min with mobility secondary to muscle weakness, decreased cardiorespiratoy endurance, decreased coordination, and decreased standing balance, decreased postural control, hemiplegia, and decreased balance strategies.  Prior to hospitalization, patient was independent  with mobility and lived with Spouse, Son in a House home.  Home access is 4 stepsStairs to enter.  Patient will benefit from skilled PT intervention to maximize safe functional mobility, minimize fall risk, and decrease caregiver burden for planned discharge home with intermittent assist.  Anticipate patient will benefit from follow up OP at discharge.  PT - End of Session Activity Tolerance: Tolerates 10 - 20 min activity with multiple rests Endurance Deficit: Yes PT Assessment Rehab Potential (ACUTE/IP ONLY): Excellent PT Barriers to Discharge: [place] home environment;Decreased caregiver support;Home environment access/layout;Weight;Insurance for SNF coverage PT Patient demonstrates impairments in the following area(s): Balance;Edema;Endurance;Motor;Safety PT Transfers Functional Problem(s): Bed Mobility;Bed to Chair;Car;Furniture;Floor PT Locomotion Functional Problem(s): Ambulation;Wheelchair Mobility;Stairs PT Plan PT Intensity: Minimum of 1-2 x/day ,45 to 90 minutes PT Frequency: 5 out of 7 days PT Duration Estimated Length of Stay: 4-5 days PT Treatment/Interventions: Ambulation/gait training;Discharge planning;Functional mobility training;Psychosocial support;Therapeutic Activities;Visual/perceptual remediation/compensation;Balance/vestibular training;Disease management/prevention;Neuromuscular re-education;Skin care/wound management;Therapeutic Exercise;Wheelchair propulsion/positioning;Cognitive  remediation/compensation;DME/adaptive equipment instruction;Pain management;UE/LE Strength taining/ROM;Community reintegration;Functional electrical stimulation;Patient/family education;Stair training;UE/LE Coordination activities;Splinting/orthotics PT Transfers Anticipated Outcome(s): Mod I with LRAD PT Locomotion Anticipated Outcome(s): Mod I ambulatory with LRAD PT Recommendation Recommendations for Other Services: Therapeutic Recreation consult Therapeutic Recreation Interventions: Stress management;Outing/community reintergration Follow Up Recommendations: Outpatient PT Patient destination: Home Equipment Recommended: To be determined  PT Evaluation Precautions/Restrictions Precautions Precautions: Fall Precaution Comments: L LE lean; weakness Restrictions Weight Bearing Restrictions: No General Chart Reviewed: Yes Family/Caregiver Present: No Vital SignsTherapy Vitals Pulse Rate: 75 BP: (!) 153/88 Patient Position (if appropriate): Orthostatic Vitals Pain Pain Assessment Pain Scale: 0-10 Pain Score: 0-No pain Pain Interference Pain Interference Pain Effect on Sleep: 1. Rarely or not at all Pain Interference with Therapy Activities: 1. Rarely or not at all Pain Interference with Day-to-Day Activities: 1. Rarely or not at all Home Living/Prior [place] Available Help at Discharge: Family;Available PRN/intermittently (with have near 24/7 coverage) Type of Home: House Home Access: Stairs to enter [employer] of Steps: 4 steps Entrance Stairs-Rails: Can reach both Home Layout: Two level Alternate Level Stairs-Number of Steps: 10+10 with landing Alternate Level Stairs-Rails: Right (Bil rail in first flight) Bathroom Shower/Tub: Walk-in shower  Lives With: Spouse;Son Prior Function Level of Independence: Independent with basic ADLs  Able to Take Stairs?: Yes Driving: Yes Vocation: Full time employment 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 Leisure: Hobbies-yes (Comment) (studying for master of religion degree) Vision/Perception  Vision - History Ability to See in Adequate Light: 0 Adequate Vision - Assessment Eye Alignment: Within Functional Limits Ocular Range of Motion: Within Functional Limits Tracking/Visual Pursuits: Able to track stimulus in all quads without difficulty Saccades: Within functional limits Convergence: Within functional limits Perception Perception: Within Functional Limits Praxis Praxis: Intact  Cognition Orientation Level: Oriented X4 Sensation Sensation Light Touch: (P) Appears Intact Coordination Gross Motor Movements are Fluid and Coordinated: (P) Yes Finger Nose Finger Test: (P) mild dysmetria on the LUE Heel Shin Test: (P) mild dysmetria on the LLE at end range Motor  Motor Motor: Hemiplegia;Ataxia Motor - Skilled Clinical Observations: mild L side hemiplegia and ataxia   Trunk/Postural Assessment     Balance Balance Balance Assessed: Yes Standardized Balance Assessment Standardized Balance Assessment: Berg Balance Test Berg Balance Test Sit to Stand: Able to stand  independently using hands Standing Unsupported: Able to stand safely 2 minutes Sitting with Back Unsupported but Feet Supported on Floor or Stool: Able to sit safely and securely 2 minutes Stand to Sit: Controls descent by using hands Transfers: Able to transfer safely, definite need of hands Standing Unsupported with Eyes Closed: Able to stand 10 seconds safely Standing Ubsupported with Feet Together: Able to place feet together independently and stand 1 minute safely From Standing, Reach Forward with Outstretched Arm: Can reach confidently >25 cm (10") From Standing Position, Pick up Object from Floor: Able to pick up shoe, needs supervision From Standing Position, Turn to Look Behind Over each Shoulder: Turn sideways only but maintains balance Turn 360 Degrees: Able to turn 360 degrees safely but  slowly Standing Unsupported, Alternately Place Feet on Step/Stool: Able to stand independently and complete 8 steps >20 seconds Standing Unsupported, One Foot in Front: Able to plae foot ahead of the other independently and hold 30 seconds Standing on One Leg: Tries to lift leg/unable to hold 3 seconds but remains standing independently Total Score: 43 Extremity Assessment      RLE Assessment RLE Assessment: Within Functional Limits General Strength Comments: grossly 5/5 LLE Assessment LLE Assessment: Exceptions to [facility] General Strength Comments: grossly 4+/5 proximal to distal  Care Tool Care Tool Bed Mobility Roll left and right activity   Roll left and right assist level: Supervision/Verbal cueing    Sit to lying activity   Sit to lying assist level: Supervision/Verbal cueing    Lying to sitting  on side of bed activity   Lying to sitting on side of bed assist level: the ability to move from lying on the back to sitting on the side of the bed with no back support.: Supervision/Verbal cueing     Care Tool Transfers Sit to stand transfer   Sit to stand assist level: Supervision/Verbal cueing    Chair/bed transfer   Chair/bed transfer assist level: Supervision/Verbal cueing     Physiological scientist transfer assist level: Minimal Assistance - Patient > 75%      Care Tool Locomotion Ambulation   Assist level: Contact Guard/Touching assist Assistive device: No Device Max distance: 260ft  Walk 10 feet activity   Assist level: Contact Guard/Touching assist Assistive device: No Device   Walk 50 feet with 2 turns activity   Assist level: Contact Guard/Touching assist Assistive device: No Device  Walk 150 feet activity   Assist level: Contact Guard/Touching assist Assistive device: No Device  Walk 10 feet on uneven surfaces activity   Assist level: Contact Guard/Touching assist    Stairs   Assist level: Contact Guard/Touching assist Stairs  assistive device: 2 hand rails    Walk up/down 1 step activity   Walk up/down 1 step (curb) assist level: Contact Guard/Touching assist Walk up/down 1 step or curb assistive device: 2 hand rails  Walk up/down 4 steps activity   Walk up/down 4 steps assist level: Contact Guard/Touching assist Walk up/down 4 steps assistive device: 2 hand rails  Walk up/down 12 steps activity   Walk up/down 12 steps assist level: Contact Guard/Touching assist    Pick up small objects from floor   Pick up small object from the floor assist level: Contact Guard/Touching assist    Wheelchair Is the patient using a wheelchair?: No     Wheelchair assist level: Supervision/Verbal cueing Max wheelchair distance: 150  Wheel 50 feet with 2 turns activity   Assist Level: Supervision/Verbal cueing  Wheel 150 feet activity   Assist Level: Supervision/Verbal cueing    Refer to Care Plan for [place] 1    Recommendations for other services: Neuropsych and Therapeutic Recreation  Stress management and Outing/community reintegration  Skilled Therapeutic Intervention Mobility Bed Mobility Bed Mobility: Rolling Right;Rolling Left;Sit to Supine;Supine to Sit Rolling Right: Supervision/verbal cueing Rolling Left: Supervision/Verbal cueing Supine to Sit: Supervision/Verbal cueing Sit to Supine: Supervision/Verbal cueing Transfers Transfers: Sit to Stand;Stand Pivot Transfers Sit to Stand: Supervision/Verbal cueing Stand Pivot Transfers: Supervision/Verbal cueing Locomotion  Gait Ambulation: Yes Gait Assistance: Contact Guard/Touching assist Gait Distance (Feet): 200 Feet Gait Gait: Yes Gait Pattern: Impaired Gait Pattern: Ataxic;Lateral trunk lean to left Gait velocity: 1.3m/s average of 2 trials Stairs / Additional Locomotion Stairs: Yes Stairs Assistance: Contact Guard/Touching assist Stair Management Technique: Two rails Height of Stairs: 6 Wheelchair  Mobility Wheelchair Mobility: Yes Wheelchair Assistance: Chartered loss adjuster: Both upper extremities Wheelchair Parts Management: Needs assistance  Pt received supine in bed and agreeable to PT. Supine>sit transfer with supervision assist and cues for safety at EOB. PT instructed patient in PT Evaluation and initiated treatment intervention; see above for results. PT educated patient in [place], rehab potential, rehab goals, and discharge recommendations along with recommendation for follow-up rehabilitation services.  Stand pivot transfers performed with supervision assist-CGA for safety as listed above. Gait training with no Ad x 21ft and CGA for safety. Mild ataxia on the LLE and Lateral lean with  fatigue noted. Patient demonstrates increased fall risk as noted by score of   43/56 on Berg Balance Scale.  (<36= high risk for falls, close to 100%; 37-45 significant >80%; 46-51 moderate >50%; 52-55 lower >25%) Stair management training with BUE supports on Rails as stated and CGA for safety. Car transfer training with min assist with pt placing LL einto care then lateral sitting with min assist. Cues for sit>pivot to improve safety. Patient returned to room and performed stand pivot to recliner with supervision assist and no AD. Pt left sitting in recliner with call bell in reach and all needs met.       Discharge Criteria: Patient will be discharged from PT if patient refuses treatment 3 consecutive times without medical reason, if treatment goals not met, if there is a change in medical status, if patient makes no progress towards goals or if patient is discharged from hospital.  The above assessment, treatment plan, treatment alternatives and goals were discussed and mutually agreed upon: by patient  Lorie Phenix 03/07/2022, 5:01 AM

## 2022-03-06 NOTE — Progress Notes (Signed)
Inpatient Rehabilitation  Patient information reviewed and entered into eRehab system by Terrica Duecker Lyndsi Altic, OTR/L, Rehab Quality Coordinator.   Information including medical coding, functional ability and quality indicators will be reviewed and updated through discharge.   

## 2022-03-06 NOTE — Progress Notes (Addendum)
PROGRESS NOTE   Subjective/Complaints:  DIscussed labs, rehab process, and no return to work until further OP rehab is completed   ROS- neg CP, SOB,  N/V/D , no constip reported  Objective:   No results found. Recent Labs    03/04/22 0344 03/06/22 0509  WBC 10.2 9.6  HGB 14.9 14.2  HCT 43.7 42.0  PLT 290 249   Recent Labs    03/04/22 0344 03/06/22 0509  NA 139 138  K 3.5 3.5  CL 104 104  CO2 25 24  GLUCOSE 103* 101*  BUN 14 17  CREATININE 1.03 1.11  CALCIUM 8.8* 8.8*    Intake/Output Summary (Last 24 hours) at 03/06/2022 0752 Last data filed at 03/06/2022 0300 Gross per 24 hour  Intake --  Output 0 ml  Net 0 ml        Physical Exam: Vital Signs Blood pressure (!) 146/71, pulse 96, temperature 98 F (36.7 C), temperature source Oral, resp. rate 16, height 6' 0.01" (1.829 m), weight (!) 142.9 kg, SpO2 95 %.   General: No acute distress Mood and affect are appropriate Heart: Regular rate and rhythm no rubs murmurs or extra sounds Lungs: Clear to auscultation, breathing unlabored, no rales or wheezes Abdomen: Positive bowel sounds, soft nontender to palpation, nondistended Extremities: No clubbing, cyanosis, or edema Skin: No evidence of breakdown, no evidence of rash Neurologic: Cranial nerves II through XII intact, motor strength is 5/5 in bright and 4+ left  deltoid, bicep, tricep, grip, hip flexor, knee extensors, ankle dorsiflexor and plantar flexor  Cerebellar exam normal finger to nose to finger bilaterally  Musculoskeletal: Full range of motion in all 4 extremities. No joint swelling   Assessment/Plan: 1. Functional deficits which require 3+ hours per day of interdisciplinary therapy in a comprehensive inpatient rehab setting. Physiatrist is providing close team supervision and 24 hour management of active medical problems listed below. Physiatrist and rehab team continue to assess barriers  to discharge/monitor patient progress toward functional and medical goals  Care Tool:  Bathing              Bathing assist       Upper Body Dressing/Undressing Upper body dressing        Upper body assist      Lower Body Dressing/Undressing Lower body dressing            Lower body assist       Toileting Toileting    Toileting assist       Transfers Chair/bed transfer  Transfers assist           Locomotion Ambulation   Ambulation assist              Walk 10 feet activity   Assist           Walk 50 feet activity   Assist           Walk 150 feet activity   Assist           Walk 10 feet on uneven surface  activity   Assist           Wheelchair     Assist  Wheelchair 50 feet with 2 turns activity    Assist            Wheelchair 150 feet activity     Assist          Blood pressure (!) 146/71, pulse 96, temperature 98 F (36.7 C), temperature source Oral, resp. rate 16, height 6' 0.01" (1.829 m), weight (!) 142.9 kg, SpO2 95 %.  Medical Problem List and Plan: 1. 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             -patient may shower             -ELOS/Goals: 3-5 days,pending evals amb with Mod A last PT note , goals are  Mod I             -Admit to CIR, PT, OT evals today  2.  Antithrombotics: -DVT/anticoagulation:  Pharmaceutical: Lovenox             -antiplatelet therapy: Aspirin 81 mg daily and Plavix 75 mg daily x3 weeks then aspirin alone 3. Pain Management: Tylenol as needed 4. Mood/Behavior/Sleep: Provide emotional support             -antipsychotic agents: N/A 5. Neuropsych/cognition: This patient is capable of making decisions on his own behalf. 6. Skin/Wound Care: Routine skin checks 7. Fluids/Electrolytes/Nutrition: Routine in and outs with follow-up chemistries 8.  Permissive  hypertension.  Presently on no antihypertensive medications.  Patient on Diovan 160 mg daily , HCTZ 12.5 mg daily prior to admission.  Resume as needed. Avoid hypotension Vitals:   03/06/22 0042 03/06/22 0440  BP: (!) 175/90 (!) 146/71  Pulse: 84 96  Resp: 20 16  Temp: 98 F (36.7 C) 98 F (36.7 C)  SpO2:  95%    9.  Hyperlipidemia.  Heart healthy diet, Crestor 40 mg daily 10.  ADHD.  Resume Vyvanse on discharge 11.  Obesity.  BMI 42.72.  Dietary follow-up 12.  Recent COVID-19 infection.  Patient asymptomatic other than mild chest congestion             -Start guaifenesin PRN 13. Hypokalemia             -Recheck CMP- K+ low normal will recheck Monday , would start Kdur if HCTZ is resumed     LOS: 1 days A FACE TO FACE EVALUATION WAS PERFORMED  Erick Colace 03/06/2022, 7:52 AM

## 2022-03-06 NOTE — Evaluation (Signed)
Occupational Therapy Assessment and Plan  Patient Details  Name: Charles Farley MRN: [id number] Date of Birth: 08-Jun-1970  OT Diagnosis: hemiplegia affecting non-dominant side and muscle weakness (generalized) Rehab Potential: Rehab Potential (ACUTE ONLY): Good ELOS: 3-4 days   Today's Date: 03/06/2022 OT Individual Time: 1050-1200 OT Individual Time Calculation (min): 70 min     Hospital Problem: Principal Problem:   Subcortical infarction [facility])   Past Medical History:  Past Medical History:  Diagnosis Date   ADHD    Hypertension    Past Surgical History: History reviewed. No pertinent surgical history.  Assessment & Plan Clinical Impression: Charles Farley is a 51 year old right-handed male with history of hypertension, hyperlipidemia, ADHD, obesity with BMI 42.72, recent COVID positive as well as questionable medical compliance to valsartan as well as Crestor.  Patient says he lives with spouse.  Two-level home 3 steps to entry.  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  Wife is a Designer, jewellery.  Presented 03/01/2022 with left-sided numbness, weakness and word finding difficulty.  Blood pressure 172/79.  Cranial CT scan negative, CT angiogram head and neck unremarkable.  Patient did not receive tPA.  Admission chemistries unremarkable except potassium 3.3, glucose 113, cholesterol 223, triglyceride 232, urine drug screen and alcohol negative.  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  Echocardiogram with ejection fraction of 60 to 65% no wall motion abnormalities grade 1 diastolic dysfunction.  Neurology follow-up placed on aspirin 81 mg daily and Plavix 75 mg daily x3 weeks then aspirin alone.  Lovenox added for DVT prophylaxis.  Tolerating a regular diet.  He reports his LUE and LLE numbness has resolved. He continues to have difficulty with fine motor movements in his LUE and mild weakness in his LLE. Reports symptoms of COVID-19 have  resolved other than mild chest congestion.  Therapy evaluations completed due to patient's left-sided weakness/word finding difficulties was admitted for a comprehensive rehab program.Patient transferred to CIR on 03/05/2022 .    Patient currently requires supervision with basic self-care skills and IADL secondary to muscle weakness and decreased standing balance, decreased postural control, and decreased balance strategies.  Prior to hospitalization, patient could complete ADLS with independent .  Patient will benefit from skilled intervention to increase independence with basic self-care skills prior to discharge home with care partner.  Anticipate patient will require intermittent supervision and no further OT follow recommended.  OT - End of Session Activity Tolerance: Tolerates 30+ min activity without fatigue Endurance Deficit: Yes Endurance Deficit Description: fatigue on the L OT Assessment Rehab Potential (ACUTE ONLY): Good OT Patient demonstrates impairments in the following area(s): Balance;Endurance;Motor OT Basic ADL's Functional Problem(s): Bathing;Dressing;Toileting OT Transfers Functional Problem(s): Toilet;Tub/Shower OT Additional Impairment(s): None OT Plan OT Intensity: Minimum of 1-2 x/day, 45 to 90 minutes OT Frequency: 5 out of 7 days OT Duration/Estimated Length of Stay: 3-4 days OT Treatment/Interventions: Balance/vestibular training;Self Care/advanced ADL retraining;Therapeutic Activities;UE/LE Coordination activities;Discharge planning;Functional mobility training;Patient/family education;Therapeutic Exercise;Community reintegration;DME/adaptive equipment instruction;UE/LE Strength taining/ROM;Neuromuscular re-education OT Self Feeding Anticipated Outcome(s): no goal OT Basic Self-Care Anticipated Outcome(s): mod I OT Toileting Anticipated Outcome(s): mod I OT Bathroom Transfers Anticipated Outcome(s): mod I OT Recommendation Patient destination: Home Follow Up  Recommendations: None Equipment Recommended: To be determined   OT Evaluation Precautions/Restrictions  Precautions Precautions: Fall Precaution Comments: L hemi Restrictions Weight Bearing Restrictions: No General Chart Reviewed: Yes Family/Caregiver Present: No Vital Signs Therapy Vitals Pulse Rate: 75 BP: (!) 153/88 Patient Position (if appropriate): Orthostatic Vitals  Pain Pain Assessment Pain Scale: 0-10 Pain Score: 0-No pain Home Living/Prior Functioning Home Living Family/patient expects to be discharged to:: Private residence Living Arrangements: Spouse/significant other Available Help at Discharge: Family, Available PRN/intermittently (son works nights and wife works days so they can alternate) Type of Home: House Home Access: Stairs to enter Technical brewer of Steps: 4 steps Entrance Stairs-Rails: Can reach both Home Layout: Two level Alternate Level Stairs-Number of Steps: 10+10 with landing Alternate Level Stairs-Rails: Right Bathroom Shower/Tub: Multimedia programmer: Standard Bathroom Accessibility: Yes  Lives With: Spouse, Son IADL History Homemaking Responsibilities: Yes Meal Prep Responsibility: Secondary Laundry Responsibility: Secondary Cleaning Responsibility: Primary Bill Paying/Finance Responsibility: Primary Shopping Responsibility: Secondary Current License: Yes Mode of Transportation: Car Education: Masters degree Occupation: Full time employment Prior Function Level of Independence: Independent with basic ADLs, Independent with transfers, Independent with gait  Able to Take Stairs?: Yes Driving: Yes Vocation: Full time employment 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 Leisure: Hobbies-yes (Comment) Vision Baseline Vision/History: 1 Wears glasses (reading glasses) Ability to See in Adequate Light: 0 Adequate Patient Visual Report: No change from baseline Vision Assessment?: Yes Eye Alignment: Within  Functional Limits Ocular Range of Motion: Within Functional Limits Tracking/Visual Pursuits: Able to track stimulus in all quads without difficulty Saccades: Within functional limits Convergence: Within functional limits Visual Fields: No apparent deficits Perception  Perception: Within Functional Limits Praxis Praxis: Intact Cognition Cognition Overall Cognitive Status: Within Functional Limits for tasks assessed Arousal/Alertness: Awake/alert Orientation Level: Person;Place;Situation Person: Oriented Place: Oriented Situation: Oriented Memory: Appears intact Awareness: Appears intact Problem Solving: Appears intact Safety/Judgment: Appears intact Brief Interview for Mental Status (BIMS) Repetition of Three Words (First Attempt): 3 Temporal Orientation: Year: Correct Temporal Orientation: Month: Accurate within 5 days Temporal Orientation: Day: Correct Recall: "Sock": Yes, no cue required Recall: "Blue": Yes, no cue required Recall: "Bed": Yes, after cueing ("a piece of furniture") BIMS Summary Score: 14 Sensation Sensation Light Touch: Appears Intact Hot/Cold: Appears Intact Proprioception: Appears Intact Coordination Gross Motor Movements are Fluid and Coordinated: No Fine Motor Movements are Fluid and Coordinated: No Coordination and Movement Description: very mild L hemi Finger Nose Finger Test: (P) mild dysmetria on the LUE Heel Shin Test: (P) mild dysmetria on the LLE at end range 9 Hole Peg Test: R: 23.3 L: 31.9 Motor  Motor Motor: Hemiplegia Motor - Skilled Clinical Observations: mild L hemiplegia  Trunk/Postural Assessment  Cervical Assessment Cervical Assessment: Within Functional Limits Thoracic Assessment Thoracic Assessment: Within Functional Limits Lumbar Assessment Lumbar Assessment: Within Functional Limits Postural Control Postural Control: Deficits on evaluation Righting Reactions: delayed  Balance Balance Balance Assessed:  Yes Standardized Balance Assessment Standardized Balance Assessment: Berg Balance Test Berg Balance Test Sit to Stand: Able to stand  independently using hands Standing Unsupported: Able to stand safely 2 minutes Sitting with Back Unsupported but Feet Supported on Floor or Stool: Able to sit safely and securely 2 minutes Stand to Sit: Controls descent by using hands Transfers: Able to transfer safely, definite need of hands Standing Unsupported with Eyes Closed: Able to stand 10 seconds safely Standing Ubsupported with Feet Together: Able to place feet together independently and stand 1 minute safely From Standing, Reach Forward with Outstretched Arm: Can reach confidently >25 cm (10") From Standing Position, Pick up Object from Floor: Able to pick up shoe, needs supervision From Standing Position, Turn to Look Behind Over each Shoulder: Turn sideways only but maintains balance Turn 360 Degrees: Able to turn 360 degrees safely but slowly  Standing Unsupported, Alternately Place Feet on Step/Stool: Able to stand independently and complete 8 steps >20 seconds Standing Unsupported, One Foot in Front: Able to plae foot ahead of the other independently and hold 30 seconds Standing on One Leg: Tries to lift leg/unable to hold 3 seconds but remains standing independently Total Score: 43 Static Sitting Balance Static Sitting - Balance Support: Feet supported Static Sitting - Level of Assistance: 7: Independent Dynamic Sitting Balance Dynamic Sitting - Balance Support: During functional activity;Feet supported Dynamic Sitting - Level of Assistance: 7: Independent Static Standing Balance Static Standing - Balance Support: During functional activity Static Standing - Level of Assistance: 5: Stand by assistance Dynamic Standing Balance Dynamic Standing - Balance Support: During functional activity Dynamic Standing - Level of Assistance: 5: Stand by assistance;4: Min assist (CGA for higher level  challenges) Extremity/Trunk Assessment RUE Assessment RUE Assessment: Within Functional Limits General Strength Comments: Box and Blocks: 53. 105 lbs grip strength LUE Assessment LUE Assessment: Exceptions to [facility] General Strength Comments: Box and blocks: 50 blocks. Grip strength 100 lbs. LUE Body System: Neuro Brunstrum levels for arm and hand: Arm;Hand Brunstrum level for arm: Stage V Relative Independence from Synergy Brunstrum level for hand: Stage VI Isolated joint movements  Care Tool Care Tool Self Care Eating   Eating Assist Level: Independent    Oral Care    Oral Care Assist Level: Supervision/Verbal cueing    Bathing   Body parts bathed by patient: Right arm;Right lower leg;Left arm;Left lower leg;Chest;Abdomen;Face;Front perineal area;Left upper leg;Buttocks;Right upper leg     Assist Level: Supervision/Verbal cueing    Upper Body Dressing(including orthotics)   What is the patient wearing?: Pull over shirt   Assist Level: Supervision/Verbal cueing    Lower Body Dressing (excluding footwear)   What is the patient wearing?: Underwear/pull up;Pants Assist for lower body dressing: Supervision/Verbal cueing    Putting on/Taking off footwear   What is the patient wearing?: Non-skid slipper socks Assist for footwear: Supervision/Verbal cueing       Care Tool Toileting Toileting activity   Assist for toileting: Supervision/Verbal cueing     Care Tool Bed Mobility Roll left and right activity   Roll left and right assist level: Supervision/Verbal cueing    Sit to lying activity   Sit to lying assist level: Supervision/Verbal cueing    Lying to sitting on side of bed activity   Lying to sitting on side of bed assist level: the ability to move from lying on the back to sitting on the side of the bed with no back support.: Supervision/Verbal cueing     Care Tool Transfers Sit to stand transfer   Sit to stand assist level: Supervision/Verbal cueing     Chair/bed transfer   Chair/bed transfer assist level: Supervision/Verbal cueing     Toilet transfer   Assist Level: Supervision/Verbal cueing     Care Tool Cognition  Expression of Ideas and Wants Expression of Ideas and Wants: 4. Without difficulty (complex and basic) - expresses complex messages without difficulty and with speech that is clear and easy to understand  Understanding Verbal and Non-Verbal Content Understanding Verbal and Non-Verbal Content: 4. Understands (complex and basic) - clear comprehension without cues or repetitions   Memory/Recall Ability Memory/Recall Ability : Current season;Location of own room;Staff names and faces;That he or she is in a hospital/hospital unit   Refer to Care Plan for [place] 1    Recommendations for other services: None  Skilled Therapeutic Intervention ADL ADL Eating: Independent Grooming: Independent Upper Body Bathing: Supervision/safety Where Assessed-Upper Body Bathing: Edge of bed Lower Body Bathing: Supervision/safety Where Assessed-Lower Body Bathing: Edge of bed Upper Body Dressing: Supervision/safety Where Assessed-Upper Body Dressing: Edge of bed Lower Body Dressing: Supervision/safety Where Assessed-Lower Body Dressing: Edge of bed Toileting: Supervision/safety Where Assessed-Toileting: Glass blower/designer: Close supervision Armed forces technical officer Method: Magazine features editor: Close supervision Social research officer, government Method: Ambulating Mobility  Bed Mobility Bed Mobility: Rolling Right;Rolling Left;Sit to Supine;Supine to Sit Rolling Right: Supervision/verbal cueing Rolling Left: Supervision/Verbal cueing Supine to Sit: Supervision/Verbal cueing Sit to Supine: Supervision/Verbal cueing Transfers Sit to Stand: Supervision/Verbal cueing   Skilled OT evaluation completed with the creation of pt centered OT POC. Pt educated on condition, ELOS, rehab expectations, and  fall risk reduction strategies throughout session. Pt declined ADLs but was able to simulate tasks for scoring. He completed 200 ft of functional mobility at (S) level to the therapy gym. He completed 9 hole peg test, box and blocks, and dynamometer testing- all above.  Remainder of session focused on higher level dynamic standing balance with challenges in single leg stance and dynamic stepping. CGA- (S) for higher level challenges but no LOB. Dual task completed with UE involvement and functional mobility. Pt able to pick up object from the floor x6 repetitions with close CGA. Pt returned to his room and was left sitting up with all needs met.    Discharge Criteria: Patient will be discharged from OT if patient refuses treatment 3 consecutive times without medical reason, if treatment goals not met, if there is a change in medical status, if patient makes no progress towards goals or if patient is discharged from hospital.  The above assessment, treatment plan, treatment alternatives and goals were discussed and mutually agreed upon: by patient  Curtis Sites 03/06/2022, 11:36 AM

## 2022-03-06 NOTE — Progress Notes (Signed)
Physical Therapy Session Note  Patient Details  Name: CUTLER SUNDAY MRN: [id number] Date of Birth: [birth year]  Today's Date: 03/06/2022 PT Individual Time: 1530-1630   60 min   Short Term Goals: Week 1:  PT Short Term Goal 1 (Week 1): STG=LTG due to ELOS _0  Skilled Therapeutic Interventions/Progress Updates:   Pt received supine in bed and agreeable to PT. Supine>sit transfer with supervision assist and cues of bed position to improve safety. Stand pivot transfer to [facility] with supervision assist from SPT for safety .   6 Min Walk Test:  Instructed patient to ambulate as quickly and as safely as possible for 6 minutes using LRAD. Patient was allowed to take standing rest breaks without stopping the test, but if the patient required a sitting rest break the clock would be stopped and the test would be over.  Results: 896 feet using  no AD with supervision assist-CGA for safety in turns. Results indicate that the patient has reduced endurance with ambulation compared to age matched norms.  Age Matched Norms: 60-69 yo M: 71 F: 60, 62-79 yo M: 75 F: 471, 66-89 yo M: 417 F: 392 MDC: 58.21 meters (190.98 feet) or 50 meters (ANPTA Core Set of Outcome Measures for Adults with Neurologic Conditions, 2018)  10 Meter Walk Test: Patient instructed to walk 10 meters (32.8 ft) as quickly and as safely as possible at their normal speed x2 and. Performed with supervision assist-CGA for safety  Normal speed 1: 1.09 m/s Normal speed 2: 1.1 m/s Average Normal speed: 1.095 m/s Cut off scores: <0.4 m/s = household Ambulator, 0.4-0.8 m/s = limited community Ambulator, >0.8 m/s = community Ambulator, >1.2 m/s = crossing a street, <1.0 = increased fall risk MCID 0.05 m/s (small), 0.13 m/s (moderate), 0.06 m/s (significant)  (ANPTA Core Set of Outcome Measures for Adults with Neurologic Conditions, 2018)  Patient demonstrates increased fall risk as noted by score of 18/30 on  Functional Gait Assessment.    <22/30 = predictive of falls, <20/30 = fall in 6 months, <18/30 = predictive of falls in PD MCID: 5 points stroke population, 4 points geriatric population (ANPTA Core Set of Outcome Measures for Adults with Neurologic Conditions, 2018) See below for details   Dynamic balance training training in parallel bars.  AP perturbations 3 x 15 sec  and lateral 3 x 15. Attempt eyes closed and able to correct LOB with heavy use of BUE on rail to prevent fall.  Tandem stance: 3 x 15sec each with min assist and heavy use of rails to prevent LOB with RLE in front  Stepping over 6 inch bolster on floor 2 x10 bil. Intermittent UE support to correct mild LOB.  Side stepping over 4"wide balance step 3 x 5 Bil with min assist from SPT for safety   Stepping over 2 inch obstacles 3 x 6 with forward gait in parallel bars with step to gait pattern x 4 and step through x 4 min assist-GCA for safety  Side stepping R and L over 2inch obstacles 3 x 6 with CGA for safety .  Patient returned to room and performed stand pivot to toilet with supervision assist. Pt left sitting in on the toilet with call bell in reach and all needs met.         Therapy Documentation Precautions:  Precautions Precautions: Fall Precaution Comments: L hemi Restrictions Weight Bearing Restrictions: No    Vital Signs: Therapy Vitals Temp: 97.9 F (36.6 C) Temp Source: Oral Pulse  Rate: 85 Resp: 18 BP: (!) 155/77 Patient Position (if appropriate): Sitting Oxygen Therapy SpO2: 98 % O2 Device: Room Air Pain: denies Balance: Standardized Balance Assessment Standardized Balance Assessment: Functional Gait Assessment Functional Gait  Assessment Gait Level Surface: Walks 20 ft in less than 7 sec but greater than 5.5 sec, uses assistive device, slower speed, mild gait deviations, or deviates 6-10 in outside of the 12 in walkway width. Change in Gait Speed: Able to change speed, demonstrates mild gait deviations, deviates 6-10 in  outside of the 12 in walkway width, or no gait deviations, unable to achieve a major change in velocity, or uses a change in velocity, or uses an assistive device. Gait with Horizontal Head Turns: Performs head turns smoothly with slight change in gait velocity (eg, minor disruption to smooth gait path), deviates 6-10 in outside 12 in walkway width, or uses an assistive device. Gait with Vertical Head Turns: Performs task with slight change in gait velocity (eg, minor disruption to smooth gait path), deviates 6 - 10 in outside 12 in walkway width or uses assistive device Gait and Pivot Turn: Pivot turns safely in greater than 3 sec and stops with no loss of balance, or pivot turns safely within 3 sec and stops with mild imbalance, requires small steps to catch balance. Step Over Obstacle: Is able to step over one shoe box (4.5 in total height) without changing gait speed. No evidence of imbalance. Gait with Narrow Base of Support: Ambulates 4-7 steps. Gait with Eyes Closed: Walks 20 ft, uses assistive device, slower speed, mild gait deviations, deviates 6-10 in outside 12 in walkway width. Ambulates 20 ft in less than 9 sec but greater than 7 sec. Ambulating Backwards: Walks 20 ft, slow speed, abnormal gait pattern, evidence for imbalance, deviates 10-15 in outside 12 in walkway width. Steps: Alternating feet, must use rail. Total Score: 18     Therapy/Group: Individual Therapy  Lorie Phenix 03/06/2022, 3:55 PM

## 2022-03-06 NOTE — Progress Notes (Signed)
[facility] Individual Statement of Services  Patient Name:  Charles Farley  Date:  03/06/2022  Welcome to the [facility].  Our goal is to provide you with an individualized program based on your diagnosis and situation, designed to meet your specific needs.  With this comprehensive rehabilitation program, you will be expected to participate in at least 3 hours of rehabilitation therapies Monday-Friday, with modified therapy programming on the weekends.  Your rehabilitation program will include the following services:  Physical Therapy (PT), Occupational Therapy (OT), Speech Therapy (ST), 24 hour per day rehabilitation nursing, Therapeutic Recreaction (TR), Neuropsychology, Care Coordinator, Rehabilitation Medicine, Nutrition Services, Pharmacy Services, and Other  Weekly team conferences will be held on Wednesdays to discuss your progress.  Your Inpatient Rehabilitation Care Coordinator will talk with you frequently to get your input and to update you on team discussions.  Team conferences with you and your family in attendance may also be held.  Expected length of stay: 7-10 Days  Overall anticipated outcome:  MOD I  Depending on your progress and recovery, your program may change. Your Inpatient Rehabilitation Care Coordinator will coordinate services and will keep you informed of any changes. Your Inpatient Rehabilitation Care Coordinator's name and contact numbers are listed  below.  The following services may also be recommended but are not provided by the [facility]:   [place] will be made to provide these services after discharge if needed.  Arrangements include referral to agencies that provide these services.  Your insurance has been verified to be:   [place] Your primary doctor is:  Ferd Hibbs, NP  Pertinent information will be  shared with your doctor and your insurance company.  Inpatient Rehabilitation Care Coordinator:  Erlene Quan, [place] or [phone]  Information discussed with and copy given to patient by: Dyanne Iha, 03/06/2022, 11:52 AM

## 2022-03-06 NOTE — Progress Notes (Signed)
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 No pertinent surgical history. Social History:  reports that he has never smoked. He has never used smokeless tobacco. He reports that he does not currently use alcohol. He reports that he does not use drugs.  Family / Support Systems Patient Roles: Spouse Spouse/Significant Other: Danae Chen (Spouse) Anticipated Caregiver: Danae Chen, spouse Ability/Limitations of Caregiver: will be able to assist at home Caregiver Availability: 24/7  Social History Preferred language: English Religion: [place] - How often do you need to have someone help you when you read instructions, pamphlets, or other written material from your doctor or pharmacy?: Never   Abuse/Neglect Abuse/Neglect Assessment Can Be Completed: Yes Physical Abuse: Denies Verbal Abuse: Denies Sexual Abuse: Denies Exploitation of patient/patient's resources: Denies Self-Neglect: Denies  Patient response to: Social Isolation - How often do you feel lonely or isolated from those around you?: Never  Emotional Status Recent Psychosocial Issues: coping Psychiatric History: n/a Substance Abuse History: n/a  Patient / Family Perceptions, Expectations & Goals Pt/Family understanding of illness & functional limitations: yes Premorbid pt/family roles/activities: Previously independent Anticipated changes in roles/activities/participation: anticipating MOD I goals. Spouse able to assist Pt/family expectations/goals: MOD I  Recruitment consultant: None Premorbid Home Care/DME Agencies: None Transportation available at discharge:  spouse able to transport Is the patient able to respond to transportation needs?: Yes In the past 12 months, has lack of transportation kept you from medical appointments or from getting medications?: No In the past 12 months, has lack of transportation kept you from meetings, work, or from getting things needed for daily living?: No  Discharge Planning Living Arrangements: Spouse/significant other Support Systems: Spouse/significant other Type of Residence: Private residence (2 level home, 3 steps to enter) Administrator, sports: Multimedia programmer (specify) Runner, broadcasting/film/video) Museum/gallery curator Resources: Employment Museum/gallery curator Screen Referred: No Living Expenses: Lives with family Does the patient have any problems obtaining your medications?: No Home Management: Independent Patient/Family Preliminary Plans: spouse able to assist if needed Care Coordinator Barriers to Discharge: Insurance for SNF coverage, Lack of/limited family support, Decreased caregiver support Care Coordinator Anticipated Follow Up Needs: HH/OP Expected length of stay: 7-10 Days  Clinical Impression Sw met with patient, introduced self and explained role. Patient a short ELOS with anticipated discharge between Sunday-Tuesday. Sw discussed with patient, he plans to return home with assistance from spouse. 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 Sw will wait for follow up from PT. No additional questions or concerns.   Dyanne Iha 03/06/2022, 12:39 PM

## 2022-03-07 MED ORDER — CLOPIDOGREL BISULFATE 75 MG PO TABS: 75.0000 mg | ORAL_TABLET | Freq: Every day | ORAL | 0 refills | Status: DC

## 2022-03-07 MED ORDER — IRBESARTAN 150 MG PO TABS: 150.0000 mg | ORAL_TABLET | Freq: Every day | ORAL | 0 refills | Status: DC

## 2022-03-07 MED ORDER — IRBESARTAN 75 MG PO TABS: 150.0000 mg | ORAL_TABLET | Freq: Every day | ORAL | Status: DC

## 2022-03-07 MED ORDER — ACETAMINOPHEN 325 MG PO TABS: 650.0000 mg | ORAL_TABLET | ORAL | Status: DC | PRN

## 2022-03-07 MED ORDER — IRBESARTAN 75 MG PO TABS
150.0000 mg | ORAL_TABLET | Freq: Every day | ORAL | Status: DC
  Administered 2022-03-08 – 2022-03-09 (×2): 150 mg via ORAL
  Filled 2022-03-07 (×2): qty 2

## 2022-03-07 MED ORDER — ROSUVASTATIN CALCIUM 40 MG PO TABS: 40.0000 mg | ORAL_TABLET | Freq: Every day | ORAL | 1 refills | Status: DC

## 2022-03-07 NOTE — Patient Care Conference (Signed)
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 Alysia Penna Social Worker Present: Erlene Quan, BSW Nurse Present: Dorien Chihuahua, RN PT Present: Barrie Folk, PT OT Present: Meriel Pica, OT     Current Status/Progress Goal Weekly Team Focus  Bowel/Bladder     Cont        Swallow/Nutrition/ Hydration             ADL's   Mod I toileting, distant Supervision with ambulation in room to bathroom and with bathing, dressing, grooming.  Good FMC - able to tie shoes.  decreased LLE strength  Mod I overall  ADL training, pt/fam ed, LLE strengthening,dynamic balance   Mobility   supervision assist for transfers. gait and         Communication             Safety/Cognition/ Behavioral Observations            Pain     N/A         Skin     N/A          Discharge Planning:    Home with wife 2level 3 ste right railing  Team Discussion: Doing well overall post right CVA.  Patient on target to meet rehab goals: yes  *See Care Plan and progress notes for long and short-term goals.   Revisions to Treatment Plan:  N/A  Teaching Needs: Safety, medications, dietary modifications, transfers, etc.  Current Barriers to Discharge: Decreased caregiver support and Home enviroment access/layout  Possible Resolutions to Barriers: Family Education OP follow up services     Medical Summary Current  Status: BP still elevated , no bowel or bladder issues  Barriers to Discharge: Medical stability   Possible Resolutions to Barriers/Weekly Focus: adjust BP meds, anticipate short LOS based on functional status   Continued Need for Acute Rehabilitation Level of Care: The patient requires daily medical management by a physician with specialized training in physical medicine and rehabilitation for the following reasons: Direction of a multidisciplinary physical rehabilitation program to maximize functional independence : Yes Medical management of patient stability for increased activity during participation in an intensive rehabilitation regime.: Yes Analysis of laboratory values and/or radiology reports with any subsequent need for medication adjustment and/or medical intervention. : Yes   I attest that I was present, lead the team conference, and concur with the assessment and plan of the team.   Dorien Chihuahua B 03/07/2022, 1:54 PM

## 2022-03-07 NOTE — Progress Notes (Signed)
Physical Therapy Session Note  Patient Details  Name: Charles Farley MRN: [id number] Date of Birth: [birth year]  Today's Date: 03/07/2022 PT Individual Time: 1032-1114 PT Individual Time Calculation (min): 42 min   Short Term Goals: Week 1:  PT Short Term Goal 1 (Week 1): STG=LTG due to ELOS  Skilled Therapeutic Interventions/Progress Updates:  Patient seated upright in recliner on entrance to room. Patient alert and agreeable to PT session.   Patient with no pain complaint at start of session.  Therapeutic Activity: Transfers: Pt performed sit<>stand and stand pivot transfers throughout session with supervision/ Mod I. No vc required for technique.  Gait Training:  Pt ambulated >150 ft x2 using no AD with supervision/ Mod I for time and decreased pace. Demonstrated good balance, and safe pace. No vc required.   Neuromuscular Re-ed: NMR facilitated during session with focus on dynamic balance. Pt guided in balance challenges using agility ladder. Guided in pass through and back with high knee stepping demonstrating improved technique and balance with vc of slowing movement and conscious practice of fwd weight shift prior to lift of rear foot to take step. Progressed to stepping around cones placed outside of alternating squares. Then progressed to toe taps on cones. Requires continued vc/ tc for conscious focus on balance shifting prior to progressing step in order to demo improvement.   Resisted ambulation over low hurdles, on Airex pad with self perturbations, and over bolster. Around day room and nurses' station with resisted pull at gait belt. all for increased hip and ankle musculature strengthening and improved balance.   NMR performed for improvements in motor control and coordination, balance, sequencing, judgement, and self confidence/ efficacy in performing all aspects of mobility at highest level of independence.   Patient seated upright in recliner at end of session with  brakes locked, no alarm set, and all needs within reach.   Therapy Documentation Precautions:  Precautions Precautions: Fall Precaution Comments: L hemi Restrictions Weight Bearing Restrictions: No General:   Vital Signs: Therapy Vitals Temp: 98.5 F (36.9 C) Temp Source: Oral Pulse Rate: 91 Resp: 18 BP: (!) 171/78 Patient Position (if appropriate): Sitting Oxygen Therapy SpO2: 100 % O2 Device: Room Air Pain: Pain Assessment Pain Score: 0-No pain  Therapy/Group: Individual Therapy  Alger Simons PT, DPT, CSRS 03/07/2022, 2:10 PM

## 2022-03-07 NOTE — Plan of Care (Signed)
  Problem: Consults Goal: RH STROKE PATIENT EDUCATION Description: See Patient Education module for education specifics  Outcome: Progressing   Problem: RH BOWEL ELIMINATION Goal: RH STG MANAGE BOWEL WITH ASSISTANCE Description: STG Manage Bowel with min Assistance. Outcome: Progressing Goal: RH STG MANAGE BOWEL W/MEDICATION W/ASSISTANCE Description: STG Manage Bowel with Medication with min Assistance. Outcome: Progressing   Problem: RH BLADDER ELIMINATION Goal: RH STG MANAGE BLADDER WITH ASSISTANCE Description: STG Manage Bladder With min Assistance Outcome: Progressing   Problem: RH SKIN INTEGRITY Goal: RH STG SKIN FREE OF INFECTION/BREAKDOWN Description: Skin will be free of infection/breakdown with min assist Outcome: Progressing   Problem: RH SAFETY Goal: RH STG ADHERE TO SAFETY PRECAUTIONS W/ASSISTANCE/DEVICE Description: STG Adhere to Safety Precautions With min Assistance/Device. Outcome: Progressing   Problem: RH KNOWLEDGE DEFICIT Goal: RH STG INCREASE KNOWLEDGE OF HYPERTENSION Description: Patient/caregiver will be able to identify medications related to HTN and other lifestyle changes to reduce hypertension from nursing education and handouts. Outcome: Progressing Goal: RH STG INCREASE KNOWLEGDE OF HYPERLIPIDEMIA Description: Patient/caregiver will be able to verbalize HLD medications and diet choices to reduce cholesterol from nursing education and handouts. Outcome: Progressing Goal: RH STG INCREASE KNOWLEDGE OF STROKE PROPHYLAXIS Description: Stroke prophylaxis is ASA 81mg  and Plavix x 3 weeks and then ASA alone. Outcome: Progressing   Problem: Education: Goal: Knowledge of disease or condition will improve Outcome: Progressing Goal: Knowledge of secondary prevention will improve (MUST DOCUMENT ALL) Outcome: Progressing Goal: Knowledge of patient specific risk factors will improve [place] N/A or DELETE if not current risk factor) Outcome: Progressing    Problem: Ischemic Stroke/TIA Tissue Perfusion: Goal: Complications of ischemic stroke/TIA will be minimized Outcome: Progressing   Problem: Coping: Goal: Will verbalize positive feelings about self Outcome: Progressing Goal: Will identify appropriate support needs Outcome: Progressing   Problem: Health Behavior/Discharge Planning: Goal: Ability to manage health-related needs will improve Outcome: Progressing Goal: Goals will be collaboratively established with patient/family Outcome: Progressing   Problem: Self-Care: Goal: Ability to participate in self-care as condition permits will improve Outcome: Progressing Goal: Verbalization of feelings and concerns over difficulty with self-care will improve Outcome: Progressing Goal: Ability to communicate needs accurately will improve Outcome: Progressing   Problem: Nutrition: Goal: Risk of aspiration will decrease Outcome: Progressing Goal: Dietary intake will improve Outcome: Progressing

## 2022-03-07 NOTE — Discharge Summary (Signed)
Physician Discharge Summary  Patient ID: ARDIE MCLENNAN MRN: [id number] DOB/AGE: 1970-06-24 51 y.o.  Admit date: 03/05/2022 Discharge date: 03/09/2022  Discharge Diagnoses:  Principal Problem:   Subcortical infarction [facility]) DVT prophylaxis Permissive hypertension Hyperlipidemia ADHD Obesity Recent COVID infection   Discharged Condition: Stable  Significant Diagnostic Studies: ECHOCARDIOGRAM COMPLETE  Result Date: 03/03/2022    ECHOCARDIOGRAM REPORT   Patient Name:   MASARU CHAMBERLIN Parfait Date of Exam: 03/03/2022 Medical Rec #:  [id number]     Height:       72.0 in Accession #:    [id number]    Weight:       315.0 lb Date of Birth:  03/16/1971     BSA:          2.584 m Patient Age:    3 years      BP:           176/89 mmHg Patient Gender: M             HR:           69 bpm. Exam Location:  Inpatient Procedure: 2D Echo, Cardiac Doppler, Color Doppler and Intracardiac            Opacification Agent Indications:    Stroke I63.9  History:        Patient has no prior history of Echocardiogram examinations.                 Risk Factors:Hypertension, Dyslipidemia and Non-Smoker.  Sonographer:    Greer Pickerel Referring Phys: [phone] JARED M GARDNER  Sonographer Comments: Technically difficult study due to poor echo windows, no subcostal window and patient is obese. Image acquisition challenging due to respiratory motion. IMPRESSIONS  1. Left ventricular ejection fraction, by estimation, is 60 to 65%. The left ventricle has normal function. The left ventricle has no regional wall motion abnormalities. There is mild concentric left ventricular hypertrophy. Left ventricular diastolic parameters are consistent with Grade I diastolic dysfunction (impaired relaxation).  2. Right ventricular systolic function is normal. The right ventricular size is normal. Tricuspid regurgitation signal is inadequate for assessing PA pressure.  3. The mitral valve is normal in structure. Trivial mitral valve regurgitation. No evidence  of mitral stenosis.  4. The aortic valve is tricuspid. Aortic valve regurgitation is mild. No aortic stenosis is present.  5. The inferior vena cava is normal in size with greater than 50% respiratory variability, suggesting right atrial pressure of 3 mmHg. Comparison(s): No prior Echocardiogram. Conclusion(s)/Recommendation(s): No intracardiac source of embolism detected on this transthoracic study. Consider a transesophageal echocardiogram to exclude cardiac source of embolism if clinically indicated. FINDINGS  Left Ventricle: Left ventricular ejection fraction, by estimation, is 60 to 65%. The left ventricle has normal function. The left ventricle has no regional wall motion abnormalities. Definity contrast agent was given IV to delineate the left ventricular  endocardial borders. The left ventricular internal cavity size was normal in size. There is mild concentric left ventricular hypertrophy. Left ventricular diastolic parameters are consistent with Grade I diastolic dysfunction (impaired relaxation). Right Ventricle: The right ventricular size is normal. No increase in right ventricular wall thickness. Right ventricular systolic function is normal. Tricuspid regurgitation signal is inadequate for assessing PA pressure. Left Atrium: Left atrial size was normal in size. Right Atrium: Right atrial size was normal in size. Pericardium: There is no evidence of pericardial effusion. Mitral Valve: The mitral valve is normal in structure. Trivial mitral valve regurgitation. No evidence of mitral valve stenosis.  Tricuspid Valve: The tricuspid valve is normal in structure. Tricuspid valve regurgitation is trivial. Aortic Valve: The aortic valve is tricuspid. Aortic valve regurgitation is mild. No aortic stenosis is present. Pulmonic Valve: The pulmonic valve was normal in structure. Pulmonic valve regurgitation is trivial. Aorta: The aortic root and ascending aorta are structurally normal, with no evidence of  dilitation. Venous: The inferior vena cava is normal in size with greater than 50% respiratory variability, suggesting right atrial pressure of 3 mmHg. IAS/Shunts: The atrial septum is grossly normal.  LEFT VENTRICLE PLAX 2D LVIDd:         5.10 cm   Diastology LVIDs:         4.00 cm   LV e' medial:    5.26 cm/s LV PW:         1.00 cm   LV E/e' medial:  13.5 LV IVS:        1.00 cm   LV e' lateral:   9.90 cm/s LVOT diam:     2.20 cm   LV E/e' lateral: 7.2 LV SV:         81 LV SV Index:   31 LVOT Area:     3.80 cm  RIGHT VENTRICLE RV S prime:     19.60 cm/s TAPSE (M-mode): 2.6 cm LEFT ATRIUM             Index        RIGHT ATRIUM           Index LA diam:        3.80 cm 1.47 cm/m   RA Area:     17.20 cm LA Vol (A2C):   42.9 ml 16.60 ml/m  RA Volume:   44.60 ml  17.26 ml/m LA Vol (A4C):   46.2 ml 17.88 ml/m LA Biplane Vol: 45.1 ml 17.45 ml/m  AORTIC VALVE LVOT Vmax:   111.00 cm/s LVOT Vmean:  71.800 cm/s LVOT VTI:    0.212 m  AORTA Ao Root diam: 3.60 cm Ao Asc diam:  3.40 cm MITRAL VALVE MV Area (PHT): 2.87 cm    SHUNTS MV Decel Time: 264 msec    Systemic VTI:  0.21 m MV E velocity: 71.00 cm/s  Systemic Diam: 2.20 cm MV A velocity: 97.80 cm/s MV E/A ratio:  0.73 Laurance Flatten MD Electronically signed by Laurance Flatten MD Signature Date/Time: 03/03/2022/11:44:33 AM    Final    MR BRAIN WO CONTRAST  Result Date: 03/03/2022 CLINICAL DATA:  Stroke follow-up. EXAM: MRI HEAD WITHOUT CONTRAST TECHNIQUE: Multiplanar, multiecho pulse sequences of the brain and surrounding structures were obtained without intravenous contrast. COMPARISON:  CT head 03/01/22 FINDINGS: Brain: Acute infarct in the posterior limb of the right internal capsule (series 2, image 22). No evidence of hemorrhage. No extra-axial fluid collection. There is sequela of moderate chronic microvascular ischemic change. No hydrocephalus. Vascular: Normal flow voids. Skull and upper cervical spine: Normal marrow signal. Sinuses/Orbits: Bilateral  maxillary sinus mucosal thickening. Other: None. IMPRESSION: Acute infarct in the posterior limb of the right internal capsule. No hemorrhage. Electronically Signed   By: Lorenza Cambridge M.D.   On: 03/03/2022 08:31   CT ANGIO HEAD NECK W WO CM  Result Date: 03/01/2022 : Ordering Physician: Jacky Kindle, MD. At time of dictation, Deatra Robinson is erroneously listed in the exam header as the ordering physician. The ordering provider information is correct in Epic. CLINICAL DATA:  Left-sided weakness with expressive aphasia EXAM: CT ANGIOGRAPHY HEAD AND NECK TECHNIQUE: Multidetector CT  imaging of the head and neck was performed using the standard protocol during bolus administration of intravenous contrast. Multiplanar CT image reconstructions and MIPs were obtained to evaluate the vascular anatomy. Carotid stenosis measurements (when applicable) are obtained utilizing NASCET criteria, using the distal internal carotid diameter as the denominator. RADIATION DOSE REDUCTION: This exam was performed according to the departmental dose-optimization program which includes automated exposure control, adjustment of the mA and/or kV according to patient size and/or use of iterative reconstruction technique. CONTRAST:  75mL OMNIPAQUE IOHEXOL 350 MG/ML SOLN COMPARISON:  None Available. FINDINGS: CTA NECK FINDINGS SKELETON: There is no bony spinal canal stenosis. No lytic or blastic lesion. OTHER NECK: Normal pharynx, larynx and major salivary glands. No cervical lymphadenopathy. Unremarkable thyroid gland. UPPER CHEST: No pneumothorax or pleural effusion. No nodules or masses. AORTIC ARCH: There is no calcific atherosclerosis of the aortic arch. There is no aneurysm, dissection or hemodynamically significant stenosis of the visualized portion of the aorta. Conventional 3 vessel aortic branching pattern. The visualized proximal subclavian arteries are widely patent. RIGHT CAROTID SYSTEM: Normal without aneurysm, dissection or  stenosis. LEFT CAROTID SYSTEM: Normal without aneurysm, dissection or stenosis. VERTEBRAL ARTERIES: Right dominant configuration. Both origins are clearly patent. There is no dissection, occlusion or flow-limiting stenosis to the skull base (V1-V3 segments). CTA HEAD FINDINGS POSTERIOR CIRCULATION: --Vertebral arteries: Normal V4 segments. --Inferior cerebellar arteries: Normal. --Basilar artery: Normal. --Superior cerebellar arteries: Normal. --Posterior cerebral arteries (PCA): Normal. ANTERIOR CIRCULATION: --Intracranial internal carotid arteries: Normal. --Anterior cerebral arteries (ACA): Normal. Both A1 segments are present. Patent anterior communicating artery (a-comm). --Middle cerebral arteries (MCA): Normal. VENOUS SINUSES: As permitted by contrast timing, patent. ANATOMIC VARIANTS: None Review of the MIP images confirms the above findings. IMPRESSION: Normal CTA of the head and neck Electronically Signed   By: Deatra RobinsonKevin  Herman M.D.   On: 03/01/2022 23:04   CT HEAD WO CONTRAST  Result Date: 03/01/2022 CLINICAL DATA:  Neuro deficit, acute stroke suspected. Last known well noon today. Heaviness in the left arm and leg. EXAM: CT HEAD WITHOUT CONTRAST TECHNIQUE: Contiguous axial images were obtained from the base of the skull through the vertex without intravenous contrast. RADIATION DOSE REDUCTION: This exam was performed according to the departmental dose-optimization program which includes automated exposure control, adjustment of the mA and/or kV according to patient size and/or use of iterative reconstruction technique. COMPARISON:  None Available. FINDINGS: Brain: No evidence of acute infarction, hemorrhage, hydrocephalus, extra-axial collection or mass lesion/mass effect. Vascular: No hyperdense vessel or unexpected calcification. Skull: Normal. Negative for fracture or focal lesion. Sinuses/Orbits: No acute finding. Other: None. IMPRESSION: No acute intracranial pathology. Aspects score of 10.  Electronically Signed   By: Larose HiresImran  Ahmed D.O.   On: 03/01/2022 20:35    Labs:  Basic Metabolic Panel: Recent Labs  Lab 03/04/22 0344 03/06/22 0509  NA 139 138  K 3.5 3.5  CL 104 104  CO2 25 24  GLUCOSE 103* 101*  BUN 14 17  CREATININE 1.03 1.11  CALCIUM 8.8* 8.8*  MG 2.1  --     CBC: Recent Labs  Lab 03/04/22 0344 03/06/22 0509  WBC 10.2 9.6  NEUTROABS  --  6.5  HGB 14.9 14.2  HCT 43.7 42.0  MCV 83.7 84.7  PLT 290 249    CBG: No results for input(s): "GLUCAP" in the last 168 hours.  Family history.  Positive for hypertension as well as hyperlipidemia.  Denies any colon cancer esophageal cancer or rectal cancer  Brief HPI:  Brad I Gulla is a 51 y.o. right-handed male with history of questionable medical compliance to valsartan as well as Crestor.  Patient lives with spouse.  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  Wife is a Publishing rights manager.  Presented 03/01/2022 with left-sided numbness weakness and word finding difficulty.  Blood pressure 172/79.  Cranial CT scan negative, CT angiogram head and neck unremarkable.  Patient did not receive tPA.  Admission chemistries unremarkable except potassium 3.3 glucose 113 cholesterol 223 urine drug screen and alcohol negative.  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  Echocardiogram with ejection fraction of 60 to 65% no wall motion abnormalities grade 1 diastolic dysfunction.  Neurology follow-up placed on aspirin 81 mg daily and Plavix 75 mg daily x3 weeks then aspirin alone.  Lovenox added for DVT prophylaxis.  Tolerating a regular diet.  Reports symptoms of COVID-19 in the past since resolved.  Therapy evaluations completed due to patient left-sided weakness word finding difficulties was admitted for a comprehensive rehab program.   Hospital Course: Kasson Lamere Donley was admitted to rehab 03/05/2022 for inpatient therapies to consist of PT, ST and OT at least three hours five days a week. Past  admission physiatrist, therapy team and rehab RN have worked together to provide customized collaborative inpatient rehab.  Pertaining to patient's posterior limb right internal capsule infarction remained stable.  Follow-up neurology services.  Low-dose aspirin and Plavix x3 weeks then aspirin alone.  Lovenox for DVT prophylaxis no bleeding episodes.  Permissive hypertension patient placed on low-dose Avapro.  Hyperlipidemia Crestor as indicated.  History of ADHD could resume Vyvanse on discharge.  Obesity BMI 42.72 dietary follow-up.  Recent COVID-19 infection patient asymptomatic.  Question medical noncompliance patient did receive counseling in regards to maintaining medical regimen.   Blood pressures were monitored on TID basis and remained controlled    Rehab course: During patient's stay in rehab weekly team conferences were held to monitor patient's progress, set goals and discuss barriers to discharge. At admission, patient required moderate assist 60 feet 1 person hand-held assist minimal guard sit to stand  Physical exam.  Blood pressure 149/67 pulse 72 temperature 98 respirations 19 oxygen saturations 96% room air Constitutional.  No acute distress HEENT Head.  Normocephalic and atraumatic Eyes.  Pupils round and reactive to light no discharge without nystagmus Neck.  Supple nontender no JVD without thyromegaly Cardiac regular rate and rhythm without any extra sounds or murmur heard Abdomen.  Soft nontender positive bowel sounds without rebound Respiratory effort normal no respiratory distress without wheeze Extremities.  No clubbing cyanosis or edema Skin.  Clean warm and dry Neurologic.  Alert oriented makes eye contact with examiner.  Good insight and awareness.  Cranial nerves II through XII intact follows commands.  Sensation intact to light touch Strength 5/5 bilateral in B/L UE Strength 5/5 right lower extremity Strength 4+/5 left hip flexion, 5/5 knee extension, 4+/5 ankle  plantar dorsiflexion Slight palmar drift left upper extremity  He/She  has had improvement in activity tolerance, balance, postural control as well as ability to compensate for deficits. He/She has had improvement in functional use RUE/LUE  and RLE/LLE as well as improvement in awareness.  Supervision supine to sit transfers.  Stand pivot transfer to wheelchair supervision.  Ambulates 896 feet without assistive device supervision assist contact-guard for safety and turns.  Patient can gather belongings for ADLs.  Supervision basic self-care skills.  Full family teaching completed plan discharge to home  Disposition: Discharge to home    Diet: Regular  Special Instructions: No driving smoking or alcohol  Medications at discharge 1.  Tylenol as needed 2.  Aspirin 81 mg p.o. daily 3.  Plavix 75 mg p.o. daily until 03/21/2022 and stop 4.  Crestor 40 mg p.o. daily 5.  Avapro 150 mg daily    30-35 minutes were spent completing discharge summary and discharge planning   Discharge Instructions     Ambulatory referral to Neurology   Complete by: As directed    An appointment is requested in approximately: 4 weeks posterior limb internal capsule infarction   Ambulatory referral to Physical Medicine Rehab   Complete by: As directed    Moderate complexity follow-up 1 to 2 weeks PLIC infarction        Follow-up Information     Kirsteins, Victorino Sparrow, MD Follow up.   Specialty: Physical Medicine and Rehabilitation Why: office to call for appointment Contact information: [address] [place] [address] [phone]         Lance Bosch, NP Follow up.   Specialty: Nurse Practitioner Contact information: [address] [place] [address] [phone]                 Signed: Mcarthur Rossetti Adilynne Fitzwater 03/09/2022, 3:19 PM

## 2022-03-07 NOTE — Plan of Care (Signed)
  Problem: RH Balance Goal: LTG Patient will maintain dynamic sitting balance (PT) Description: LTG:  Patient will maintain dynamic sitting balance with assistance during mobility activities (PT) Flowsheets (Taken 03/07/2022 0450) LTG: Pt will maintain dynamic sitting balance during mobility activities with:: Independent Goal: LTG Patient will maintain dynamic standing balance (PT) Description: LTG:  Patient will maintain dynamic standing balance with assistance during mobility activities (PT) Flowsheets (Taken 03/07/2022 0450) LTG: Pt will maintain dynamic standing balance during mobility activities with:: Independent with assistive device    Problem: RH Bed Mobility Goal: LTG Patient will perform bed mobility with assist (PT) Description: LTG: Patient will perform bed mobility with assistance, with/without cues (PT). Flowsheets (Taken 03/07/2022 0450) LTG: Pt will perform bed mobility with assistance level of: Independent   Problem: RH Bed to Chair Transfers Goal: LTG Patient will perform bed/chair transfers w/assist (PT) Description: LTG: Patient will perform bed to chair transfers with assistance (PT). Flowsheets (Taken 03/07/2022 0450) LTG: Pt will perform Bed to Chair Transfers with assistance level: Independent with assistive device    Problem: RH Car Transfers Goal: LTG Patient will perform car transfers with assist (PT) Description: LTG: Patient will perform car transfers with assistance (PT). Flowsheets (Taken 03/07/2022 0450) LTG: Pt will perform car transfers with assist:: Supervision/Verbal cueing   Problem: RH Ambulation Goal: LTG Patient will ambulate in controlled environment (PT) Description: LTG: Patient will ambulate in a controlled environment, # of feet with assistance (PT). Flowsheets (Taken 03/07/2022 0450) LTG: Pt will ambulate in controlled environ  assist needed:: Independent with assistive device LTG: Ambulation distance in controlled environment: 221ft  with LRAD Goal: LTG Patient will ambulate in home environment (PT) Description: LTG: Patient will ambulate in home environment, # of feet with assistance (PT). Flowsheets (Taken 03/07/2022 0450) LTG: Pt will ambulate in home environ  assist needed:: Independent with assistive device LTG: Ambulation distance in home environment: 48ft with LRAD   Problem: RH Stairs Goal: LTG Patient will ambulate up and down stairs w/assist (PT) Description: LTG: Patient will ambulate up and down # of stairs with assistance (PT) Flowsheets (Taken 03/07/2022 0450) LTG: Pt will ambulate up/down stairs assist needed:: Supervision/Verbal cueing LTG: Pt will  ambulate up and down number of stairs: 20steps with access 2nd level in hom   Problem: RH Pre-functional/Other (Specify) Goal: RH LTG PT (Specify) 1 Description:  RH LTG PT (Specify) 1 Flowsheets (Taken 03/07/2022 0450) LTG: Other PT (Specify) 1: Pt will increased Berg balance Scale score by >7 points to indicate reduced fall at d/c

## 2022-03-07 NOTE — Progress Notes (Signed)
Inpatient Rehabilitation Care Coordinator Discharge Note   Patient Details  Name: Charles Farley MRN: [id number] Date of Birth: February 17, 1971   Discharge location: Home  Length of Stay: 4 Days  Discharge activity level: MOD I/Sup  Home/community participation: spouse  Patient response WU:[id number] Literacy - How often do you need to have someone help you when you read instructions, pamphlets, or other written material from your doctor or pharmacy?: Never  Patient response YN:[id number] Isolation - How often do you feel lonely or isolated from those around you?: Never  Services provided included: SW, Pharmacy, TR, CM, RN, SLP, OT, PT, RD, MD  Financial Services:  Financial Services Utilized: Private Insurance [employer] [facility]  Choices offered to/list presented to:    Follow-up services arranged:  Outpatient    Outpatient Servicies: Neurp Rehab      Patient response to transportation need: Is the patient able to respond to transportation needs?: Yes In the past 12 months, has lack of transportation kept you from medical appointments or from getting medications?: No In the past 12 months, has lack of transportation kept you from meetings, work, or from getting things needed for daily living?: No    Comments (or additional information):  Patient/Family verbalized understanding of follow-up arrangements:  Yes  Individual responsible for coordination of the follow-up plan: self or spouse  Confirmed correct DME delivered: Dyanne Iha 03/07/2022    Dyanne Iha

## 2022-03-07 NOTE — Progress Notes (Addendum)
Patient ID: Charles Farley, male   DOB: [birth year], 51 y.o.   MRN: [id number]  Patient set to discharge on Monday. No DME reccs. OP referral faxed to [facility].   Confirmation received 1:30 PM

## 2022-03-07 NOTE — Progress Notes (Signed)
Occupational Therapy Session Note  Patient Details  Name: Charles Farley MRN: [id number] Date of Birth: [birth year]  Today's Date: 03/07/2022 OT Individual Time: 0830-0930 OT Individual Time Calculation (min): 60 min    Short Term Goals: Week 1:   n/a  Skilled Therapeutic Interventions/Progress Updates:    Pt received in recliner. He is waiting for his wife to bring clean clothing tonight prior to showering.  He was in underwear and gown. With distant S he donned clothing, socks and shoes, tying his shoes. Due to limited L hip mobility (PLOF) he props L foot on stool to tie shoe.  Ambulated to bathroom with distant S and used toilet independently.    Discussed showering later. Placed tub bench outside of shower so pt can stand to shower but sit if he should get fatigued. He will need to stand at home as there will not be room for a shower seat in shower.  I stated on safety plan that his wife can assist him with shower this evening.  Pt ambulated to gym with no AD or support needed.  In gym, worked on a circuit rountine to facilitate LLE strength and core strength along with endurance: -standing with 5 lb dowel bar chest press 12x, elbow flex 12x, overhead sh press 12x  -seated isometric knee extension with ankle flexion with 3# ankle weight -standing "deadlift" with squat holding 5lb bar 12x He repeated circuit 3x.    He then worked on dynamic standing with cross chops holding a 2kg medicine ball, he had to reach to outside of L knee in squat and then stand tall reaching ball up and to his right overhead. 5-8x then reversed directions.  Pt did find this challenging.  In all exercises, cued pt to engage his core and focus on relaxed breathing.  No LOB with exercises. Pt ambulated back to his room with all needs met.    Therapy Documentation Precautions:  Precautions Precautions: Fall Precaution Comments: L hemi Restrictions Weight Bearing Restrictions: No    Vital Signs: Therapy  Vitals Temp: 98.4 F (36.9 C) Temp Source: Oral Pulse Rate: 75 Resp: 14 BP: 138/69 Patient Position (if appropriate): Lying Oxygen Therapy SpO2: 95 % O2 Device: Room Air Pain:   ADL: ADL Eating: Independent Grooming: Independent Upper Body Bathing: Supervision/safety Where Assessed-Upper Body Bathing: Edge of bed Lower Body Bathing: Supervision/safety Where Assessed-Lower Body Bathing: Edge of bed Upper Body Dressing: Supervision/safety Where Assessed-Upper Body Dressing: Edge of bed Lower Body Dressing: Supervision/safety Where Assessed-Lower Body Dressing: Edge of bed Toileting: Supervision/safety Where Assessed-Toileting: Glass blower/designer: Close supervision Armed forces technical officer Method: Magazine features editor: Close supervision Social research officer, government Method: Ambulating   Therapy/Group: Individual Therapy  [place] 03/07/2022, 8:37 AM

## 2022-03-07 NOTE — Progress Notes (Signed)
Occupational Therapy Session Note  Patient Details  Name: Charles Farley MRN: [id number] Date of Birth: 1970/08/23  Today's Date: 03/07/2022 OT Individual Time: 1530-1630 OT Individual Time Calculation (min): 60 min    Short Term Goals: Week 1:   STG=LTG secondary to LOS  Skilled Therapeutic Interventions/Progress Updates:    Upon OT arrival, pt seated in recliner reporting no pain "just weakness" and is agreeable to OT treatment session. Treatment intervention with a focus on functional mobility, problem solving, dynamic standing balance, IADLs, item retrieval and strengthening. Pt completes sit to stand transfer with Supervision and ambulates to rehab apartment to simulate retrieving items from microwave, fridge, freezer, oven, cabinets, pantry, and drawers. Pt with no difficulty and completes with Supervision. Pt stands with Supervision to wash, dry, and put away dishes without difficulty but does report fatigue in R hip. Pt places dirty laundry into laundry bag with Supervision and ambulates to recliner to have a rest break with Supervision. Pt completes sit to stand transfer with Supervision and navigates through hospital to locate atrium and walk outside. Pt completes with Supervision and sits at bench with Supervision for rest break. Pt ambulates to stairs and completes full flights of stairs down and up with use of hand rail and SBA. Pt returns to bench with Supervision. Pt stands at wall to perform 2x10 reps of wall push ups with noted shoulder fatigue per pt but completes well. Pt navigates back to the rehab floor with Supervision and manages washer and dryer without difficulty and Supervision. Pt ambulates to EOM in dayroom with Supervision and takes a seated rest break. Pt stands on airex foam to retrieve bean bags on L side of pt in all planes and toss onto cornhole board using the R UE. Pt completes 1x16 reps with min LOB but was able to correct himself and completes with CGA. Pt requires  seated rest break before retrieving items from the floor and board. Pt completes with Supervision. Pt ambulates back to his room with Supervision and sits in recliner to perform theraband exercises using B UE and red band. Pt completes 1x10 reps of scapular protr/retra, shoulder flex/ext, elbow flex/ext, triceps ex. Verbal cues for form provided and was issued a green theraband. Pt was left in recliner at end of session with all needs met.   Therapy Documentation Precautions:  Precautions Precautions: Fall Precaution Comments: L hemi Restrictions Weight Bearing Restrictions: No   Therapy/Group: Individual Therapy  Marvetta Gibbons 03/07/2022, 4:50 PM

## 2022-03-07 NOTE — Progress Notes (Incomplete)
Physical Therapy Session Note  Patient Details  Name: Charles Farley MRN: [id number] Date of Birth: [birth year]  Today's Date: 03/07/2022 PT Individual Time: 1420-1500 PT Individual Time Calculation (min): 40 min   Short Term Goals: Week 1:  PT Short Term Goal 1 (Week 1): STG=LTG due to ELOS  Skilled Therapeutic Interventions/Progress Updates:  Pt on responded on entrance to room. Patient was alert and agreeable to PT session. Patient states that he is slightly fatigued from morning treatment session.  Patient is with no pain complaint at start of session.  Therapeutic Activity: Bed mobility: Pt performed supine <> sit with modified independent. Provided verbal cues  weight shifts from bed to wheelchair.  Neuromuscular Re-ed: NMR facilitated during session with focus on static, dynamic balance and lower limb coordination. NMR was performed for improvement in motor control and coordination, balance, sequencing, judgement, and self confidence/ efficacy in performing all aspects of mobility at highest level of independence.   Pt performed the following exercises with visual and tactile cuing for proper technique. Min to Mod A was provided to prevent loss of balance within parallel bars and CGA during gait training.  Sit to stands (1 x 10) Sit to stands with perturbations (2 x 10) Sit to stance with perturbations & narrow stance (1 x 10) Pt required CGA while performing in front of high low table.  Cone taps Alt legs (4 x 5 ea) Positioned mirror in front of pt, vc for upright posture, tc for scap activation. Pt became too focused on tapping cone and developed poor postural control, removed cone taps and had pt focus on movement sequence while maintaining erector, scap and glute activation. Pt required Min to Mod A to prevent loss of balance within parallel bars.  Ball throws with narrow BOS (1 x 10) Ball throws with lat, fwd,bwk step (3 x 4 ea) Included verbal cuing for appropriate  weight shifts. Pt required Min A to prevent loss of balance within parallel bars.   Tandem step fwd& bwk (2 x 8 ea) Included vc and tac cuing for scap and hip engagement. Pt required Mod A to prevent loss of balance within parallel bars.   Gait speed training Pt  increased cadence to first cone & walked around, decreased cadence to second cone & walked around in opposite direction x 4. Cued Pt for glute activation and limb control in direction of turn. Pt required CGA during ambulation  Gait Training  Pt ambulated back to his room from open gym. Provided visual and tactile cuing for linear walking while maintaining scap and erector activation. Pt required CGA during ambulation.  Patient was left in recliner with all needs within reach.          Therapy Documentation Precautions:  Precautions Precautions: Fall Precaution Comments: L hemi Restrictions Weight Bearing Restrictions: No  Vital Signs: Therapy Vitals Temp: 98.5 F (36.9 C) Temp Source: Oral Pulse Rate: 91 Resp: 18 BP: (!) 171/78 Patient Position (if appropriate): Sitting Oxygen Therapy SpO2: 100 % O2 Device: Room Air Pain: Pain Assessment Pain Scale: 0-10 Pain Score: 0-No pain    Therapy/Group: Individual Therapy  Hilary Hertz PT, SPT   Hilary Hertz 03/07/2022, 3:02 PM    I attest that I was present and this session was performed under the supervision of a licensed clinician, and that the documentation accurately reflects treatment performed.   Barrie Folk PT, DPT   2:00 PM 03/08/22

## 2022-03-07 NOTE — IPOC Note (Signed)
Overall Plan of Care [facility]) Patient Details Name: Charles Farley MRN: [id number] DOB: 06/02/1970  Admitting Diagnosis: Subcortical infarction [facility])  Hospital Problems: Principal Problem:   Subcortical infarction [facility])     Functional Problem List: Nursing Bowel, Edema, Safety, Endurance, Sensory, Medication Management, Motor  PT Balance, Edema, Endurance, Motor, Safety  OT Balance, Endurance, Motor  SLP    TR         Basic ADL's: OT Bathing, Dressing, Toileting     Advanced  ADL's: OT       Transfers: PT Bed Mobility, Bed to Chair, Car, Furniture, Floor  OT Toilet, Metallurgist: PT Ambulation, Emergency planning/management officer, Stairs     Additional Impairments: OT None  SLP        TR      Anticipated Outcomes Item Anticipated Outcome  Self Feeding no goal  Swallowing      Basic self-care  mod I  Toileting  mod I   Bathroom Transfers mod I  Bowel/Bladder  continent x 2  Transfers  Mod I with LRAD  Locomotion  Mod I ambulatory with LRAD  Communication     Cognition     Pain  less than 3  Safety/Judgment  remain fall free while in rehab   Therapy Plan: PT Intensity: Minimum of 1-2 x/day ,45 to 90 minutes PT Frequency: 5 out of 7 days PT Duration Estimated Length of Stay: 4-5 days OT Intensity: Minimum of 1-2 x/day, 45 to 90 minutes OT Frequency: 5 out of 7 days OT Duration/Estimated Length of Stay: 3-4 days     Team Interventions: Nursing Interventions Patient/Family Education, Disease Management/Prevention, Discharge Planning, Pain Management, Bowel Management, Medication Management  PT interventions Ambulation/gait training, Discharge planning, Functional mobility training, Psychosocial support, Therapeutic Activities, Visual/perceptual remediation/compensation, Balance/vestibular training, Disease management/prevention, Neuromuscular re-education, Skin care/wound management, Therapeutic Exercise, Wheelchair propulsion/positioning, Cognitive  remediation/compensation, DME/adaptive equipment instruction, Pain management, UE/LE Strength taining/ROM, Community reintegration, Technical sales engineer stimulation, Patient/family education, IT trainer, UE/LE Coordination activities, Splinting/orthotics  OT Interventions Training and development officer, Self Care/advanced ADL retraining, Therapeutic Activities, UE/LE Coordination activities, Discharge planning, Functional mobility training, Patient/family education, Therapeutic Exercise, Community reintegration, Engineer, drilling, UE/LE Strength taining/ROM, Neuromuscular re-education  SLP Interventions    TR Interventions    SW/CM Interventions Discharge Planning, Psychosocial Support, Patient/Family Education, Disease Management/Prevention   Barriers to Discharge MD  Medical stability  Nursing Decreased caregiver support, Home environment access/layout, Weight, Medication compliance 2 level home with 3 ste with rails on the right  PT Inaccessible home environment, Decreased caregiver support, Home environment access/layout, Weight, Insurance for SNF coverage    OT      SLP      SW Insurance for SNF coverage, Lack of/limited family support, Decreased caregiver support     Team Discharge Planning: Destination: PT-Home ,OT- Home , SLP-  Projected Follow-up: PT-Outpatient PT, OT-  None, SLP-  Projected Equipment Needs: PT-To be determined, OT- To be determined, SLP-  Equipment Details: PT- , OT-  Patient/family involved in discharge planning: PT- Patient,  OT-Patient, SLP-   MD ELOS: 5-7d Medical Rehab Prognosis:  Good Assessment: The patient has been admitted for CIR therapies with the diagnosis of RIght subcortical infarction. The team will be addressing functional mobility, strength, stamina, balance, safety, adaptive techniques and equipment, self-care, bowel and bladder mgt, patient and caregiver education, BP management. Goals have been set at Mod I. Anticipated  discharge destination is Home . See Team Conference Notes for weekly updates to the plan  of care

## 2022-03-07 NOTE — Progress Notes (Addendum)
Nutrition Brief Note  RD received a consult for diet education. RD attempted education x2, pt unavailable at each attempt. RD will attempt education at follow-up. Medications and labs reviewed. Currently on Heart Healthy diet, meal completions are 100%.  Pt evaluated by RD during acute admission, no recent appetite changes or weight loss noted.     Charles Farley RD, LDN Clinical Dietitian See Shea Evans for contact information.

## 2022-03-07 NOTE — Progress Notes (Signed)
PROGRESS NOTE   Subjective/Complaints:  Feels fine today , reviewed labs, still having problems controling LLE   ROS- neg CP, SOB,  N/V/D , no constip reported  Objective:   No results found. Recent Labs    03/06/22 0509  WBC 9.6  HGB 14.2  HCT 42.0  PLT 249    Recent Labs    03/06/22 0509  NA 138  K 3.5  CL 104  CO2 24  GLUCOSE 101*  BUN 17  CREATININE 1.11  CALCIUM 8.8*     Intake/Output Summary (Last 24 hours) at 03/07/2022 0753 Last data filed at 03/07/2022 0743 Gross per 24 hour  Intake 1279 ml  Output --  Net 1279 ml         Physical Exam: Vital Signs Blood pressure 138/69, pulse 75, temperature 98.4 F (36.9 C), temperature source Oral, resp. rate 14, height 6' 0.01" (1.829 m), weight (!) 142.9 kg, SpO2 95 %.   General: No acute distress Mood and affect are appropriate Heart: Regular rate and rhythm no rubs murmurs or extra sounds Lungs: Clear to auscultation, breathing unlabored, no rales or wheezes Abdomen: Positive bowel sounds, soft nontender to palpation, nondistended Extremities: No clubbing, cyanosis, or edema Skin: No evidence of breakdown, no evidence of rash Neurologic: Cranial nerves II through XII intact, motor strength is 5/5 in bright and 4+ left  deltoid, bicep, tricep, grip, hip flexor, knee extensors, ankle dorsiflexor and plantar flexor  Cerebellar exam normal finger to nose to finger bilaterally  Musculoskeletal: Full range of motion in all 4 extremities. No joint swelling   Assessment/Plan: 1. Functional deficits which require 3+ hours per day of interdisciplinary therapy in a comprehensive inpatient rehab setting. Physiatrist is providing close team supervision and 24 hour management of active medical problems listed below. Physiatrist and rehab team continue to assess barriers to discharge/monitor patient progress toward functional and medical goals  Care  Tool:  Bathing    Body parts bathed by patient: Right arm, Right lower leg, Left arm, Left lower leg, Chest, Abdomen, Face, Front perineal area, Left upper leg, Buttocks, Right upper leg         Bathing assist Assist Level: Supervision/Verbal cueing     Upper Body Dressing/Undressing Upper body dressing   What is the patient wearing?: Pull over shirt    Upper body assist Assist Level: Supervision/Verbal cueing    Lower Body Dressing/Undressing Lower body dressing      What is the patient wearing?: Underwear/pull up, Pants     Lower body assist Assist for lower body dressing: Supervision/Verbal cueing     Toileting Toileting    Toileting assist Assist for toileting: Supervision/Verbal cueing     Transfers Chair/bed transfer  Transfers assist     Chair/bed transfer assist level: Supervision/Verbal cueing     Locomotion Ambulation   Ambulation assist      Assist level: Contact Guard/Touching assist Assistive device: No Device Max distance: 27ft   Walk 10 feet activity   Assist     Assist level: Contact Guard/Touching assist Assistive device: No Device   Walk 50 feet activity   Assist    Assist level: Contact Guard/Touching assist Assistive  device: No Device    Walk 150 feet activity   Assist    Assist level: Contact Guard/Touching assist Assistive device: No Device    Walk 10 feet on uneven surface  activity   Assist     Assist level: Contact Guard/Touching assist     Wheelchair     Assist Is the patient using a wheelchair?: No      Wheelchair assist level: Supervision/Verbal cueing Max wheelchair distance: 150    Wheelchair 50 feet with 2 turns activity    Assist        Assist Level: Supervision/Verbal cueing   Wheelchair 150 feet activity     Assist      Assist Level: Supervision/Verbal cueing   Blood pressure 138/69, pulse 75, temperature 98.4 F (36.9 C), temperature source Oral, resp.  rate 14, height 6' 0.01" (1.829 m), weight (!) 142.9 kg, SpO2 95 %.  Medical Problem List and Plan: 1. 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             -patient may shower             -ELOS/Goals: 3-5 days,pending evals amb with Mod A last PT note , goals are  Mod I             -Admit to CIR, PT, OT evals today  2.  Antithrombotics: -DVT/anticoagulation:  Pharmaceutical: Lovenox             -antiplatelet therapy: Aspirin 81 mg daily and Plavix 75 mg daily x3 weeks then aspirin alone 3. Pain Management: Tylenol as needed 4. Mood/Behavior/Sleep: Provide emotional support             -antipsychotic agents: N/A 5. Neuropsych/cognition: This patient is capable of making decisions on his own behalf. 6. Skin/Wound Care: Routine skin checks 7. Fluids/Electrolytes/Nutrition: Routine in and outs with follow-up chemistries 8.  Permissive hypertension.  Presently on no antihypertensive medications.  Patient on Diovan 160 mg daily , HCTZ 12.5 mg daily prior to admission.  Resume as needed. Avoid hypotension Vitals:   03/06/22 2047 03/07/22 0503  BP: (!) 161/89 138/69  Pulse: 80 75  Resp: 20 14  Temp: 98.3 F (36.8 C) 98.4 F (36.9 C)  SpO2: 98% 95%   Elevated last noc but normotensive this am , will not resume BP meds unless BP stays elevated consistently  9.  Hyperlipidemia.  Heart healthy diet, Crestor 40 mg daily 10.  ADHD.  Resume Vyvanse on discharge 11.  Obesity.  BMI 42.72.  Dietary follow-up 12.  Recent COVID-19 infection.  Patient asymptomatic other than mild chest congestion             -Start guaifenesin PRN 13. Hypokalemia             -Recheck CMP- K+ low normal will recheck Monday , would start [place] if HCTZ is resumed     LOS: 2 days A FACE TO FACE EVALUATION WAS PERFORMED  Charlett Blake 03/07/2022, 7:53 AM

## 2022-03-08 MED ORDER — HYDROCORTISONE 1 % EX CREA
TOPICAL_CREAM | Freq: Two times a day (BID) | CUTANEOUS | Status: DC
  Filled 2022-03-08: qty 28

## 2022-03-08 NOTE — Progress Notes (Signed)
Physical Therapy Discharge Summary  Patient Details  Name: Charles Farley MRN: [id number] Date of Birth: [birth year]  Date of Discharge from PT service:March 08, 2022  Today's Date: 03/08/2022 PT Individual Time: 0812-0910 and 4734-0370 PT Individual Time Calculation (min): 58 min and 72 min    Patient has met 10 of 10 long term goals due to improved activity tolerance, improved balance, improved postural control, increased strength, increased range of motion, ability to compensate for deficits, functional use of  left upper extremity and left lower extremity, and improved coordination.  Patient to discharge at an ambulatory level Modified Independent ambulatory.   Patient's care partner is independent to provide the necessary physical assistance at discharge.  Reasons goals not met: All PT goals met   Recommendation:  Patient will benefit from ongoing skilled PT services in outpatient setting to continue to advance safe functional mobility, address ongoing impairments in balance. Tranfers. Gait, coordination, and minimize fall risk.  Equipment: No equipment provided  Reasons for discharge: treatment goals met and discharge from hospital  Patient/family agrees with progress made and goals achieved: Yes  PT Treatment:  Session 1.  Pt received sitting in recliner and agreeable to PT. Pt performed sit<>stand transfer with without cues or assist from PT. Upper and lower body dressing without assist from PT.    Gait training through hall 3 x 255ft without assist from PT throughout session. Mild ataxia noted with fatigue on the LLE, but no overt LOB. 6 Min Walk Test:  Instructed patient to ambulate as quickly and as safely as possible for 6 minutes using LRAD. Patient was allowed to take standing rest breaks without stopping the test, but if the patient required a sitting rest break the clock would be stopped and the test would be over.  Results: 1229 feet (374 meters) using no AD with out  assist from PT. Results indicate that the patient has reduced endurance with ambulation compared to age matched norms. (Improved from 864ft on eval) Age Matched Norms: 8-69 yo M: 11 F: 17, 58-79 yo M: 66 F: 471, 55-89 yo M: 417 F: 392 MDC: 58.21 meters (190.98 feet) or 50 meters (ANPTA Core Set of Outcome Measures for Adults with Neurologic Conditions, 2018)  Pt performed 5 time sit<>stand (5xSTS): 10.16 sec (>15 sec indicates increased fall risk)   Patient demonstrates increased fall risk as noted by score of   50/56 on Berg Balance Scale.  (<36= high risk for falls, close to 100%; 37-45 significant >80%; 46-51 moderate >50%; 52-55 lower >25%) increased from 43 on Eval   Pt instructed in dynamic balance/gait training in parallel bars to perform side stepping R and L on level surface 39ft x 3 bil. stepping over bars weights on floor 46ft x 3 then 70ft x 3 with 3# ankle weights. Stepping over 1 bar weight with lateral resistance from red tband at waist x 8 Bil. Distanct supervision assist-Modified indep for safety throughout balance training.    Session 2  Pt received sitting in recliner and agreeable to PT. Pt performed sit<>stand transfer with no AD or assist from PT.PT instructed pt in Grad day assessment to measure progress toward goals. See below for details. CARETool mobility assessment  also completed; see CAREtool tab in navigator for details. PT instructed pt in modified otago level A and B with hand out provided cues for use of green tband to improve strengthening aspect of movements in otagot level A.   Pt performed gait training in rehab unit, through  hospital and over cement siwalk from [facility] to entrance of [facility], 742ft+ x 5 with cues for posture when navigiating extremely unlevel terrain such as grass and mulch.   Patient returned to room and performed stand pivot to recliner with no AD or Assist. Pt left sitting in recliner with call bell in reach and all needs met.        PT  Discharge Precautions/Restrictions Fall  Pain Pain Assessment Pain Scale: 0-10 Pain Score: 0-No pain Pain Interference Pain Interference Pain Effect on Sleep: 1. Rarely or not at all Pain Interference with Therapy Activities: 1. Rarely or not at all Pain Interference with Day-to-Day Activities: 1. Rarely or not at all Vision/Perception  Vision - Assessment Eye Alignment: Within Functional Limits Ocular Range of Motion: Within Functional Limits Alignment/Gaze Preference: Within Defined Limits Tracking/Visual Pursuits: Able to track stimulus in all quads without difficulty Saccades: Within functional limits Convergence: Within functional limits Perception Perception: Within Functional Limits Praxis Praxis: Intact  Cognition Overall Cognitive Status: Within Functional Limits for tasks assessed Arousal/Alertness: Awake/alert Safety/Judgment: Appears intact Sensation Sensation Light Touch: Appears Intact Hot/Cold: Appears Intact Proprioception: Appears Intact Coordination Gross Motor Movements are Fluid and Coordinated: No Heel Shin Test: slight dysmertria on the LLE Motor  Motor Motor: Hemiplegia Motor - Discharge Observations: mild L hemiplegia on the L side. imrpoved from Eval  Mobility Bed Mobility Bed Mobility: Rolling Right;Rolling Left;Sit to Supine;Supine to Sit Rolling Right: Independent Rolling Left: Independent Supine to Sit: Independent Sit to Supine: Independent Transfers Transfers: Sit to [facility];Lateral/Scoot Transfers;Stand to Sit Sit to Stand: Independent Stand to Sit: Independent Stand Pivot Transfers: Independent Transfer (Assistive device): None Locomotion  Gait Ambulation: Yes Gait Assistance: Independent with assistive device Gait Distance (Feet): 500 Feet Assistive device: None Gait Gait: Yes Gait Pattern: Impaired Gait Pattern: Ataxic (mild L LLE ataxia.) Gait velocity: 1.78m/s Stairs / Advertising account executive Assistance: Independent with assistive device Stair Management Technique: One rail Right Number of Stairs: 12 Height of Stairs: 6 Wheelchair Mobility Wheelchair Mobility: No  Trunk/Postural Assessment    Rounded shoulders and slightly depressed on the L L  Balance Standardized Balance Assessment Standardized Balance Assessment: Functional Gait Assessment Berg Balance Test Sit to Stand: Able to stand without using hands and stabilize independently Standing Unsupported: Able to stand safely 2 minutes Sitting with Back Unsupported but Feet Supported on Floor or Stool: Able to sit safely and securely 2 minutes Stand to Sit: Sits safely with minimal use of hands Transfers: Able to transfer safely, minor use of hands Standing Unsupported with Eyes Closed: Able to stand 10 seconds safely Standing Ubsupported with Feet Together: Able to place feet together independently and stand 1 minute safely From Standing, Reach Forward with Outstretched Arm: Can reach forward >12 cm safely (5") From Standing Position, Pick up Object from Floor: Able to pick up shoe safely and easily From Standing Position, Turn to Look Behind Over each Shoulder: Looks behind one side only/other side shows less weight shift Turn 360 Degrees: Able to turn 360 degrees safely one side only in 4 seconds or less Standing Unsupported, Alternately Place Feet on Step/Stool: Able to stand independently and complete 8 steps >20 seconds Standing Unsupported, One Foot in Front: Able to take small step independently and hold 30 seconds Standing on One Leg: Able to lift leg independently and hold > 10 seconds Total Score: 50 Static Sitting Balance Static Sitting - Balance Support: No upper extremity supported Static Sitting - Level of Assistance: 7: Independent  Dynamic Sitting Balance Dynamic Sitting - Balance Support: No upper extremity supported Dynamic Sitting - Level of Assistance: 7: Independent Static Standing  Balance Static Standing - Balance Support: No upper extremity supported Static Standing - Level of Assistance: 7: Independent;6: Modified independent (Device/Increase time) Dynamic Standing Balance Dynamic Standing - Balance Support: No upper extremity supported Dynamic Standing - Level of Assistance: 7: Independent;6: Modified independent (Device/Increase time) Dynamic Standing - Comments: intermittent instability with high level balacne activites, but able to correct without assist or cues from PT and incresaed time with stepping strategy to prevent fall Functional Gait  Assessment Gait Level Surface: Walks 20 ft in less than 5.5 sec, no assistive devices, good speed, no evidence for imbalance, normal gait pattern, deviates no more than 6 in outside of the 12 in walkway width. Change in Gait Speed: Able to smoothly change walking speed without loss of balance or gait deviation. Deviate no more than 6 in outside of the 12 in walkway width. Gait with Horizontal Head Turns: Performs head turns smoothly with no change in gait. Deviates no more than 6 in outside 12 in walkway width Gait with Vertical Head Turns: Performs head turns with no change in gait. Deviates no more than 6 in outside 12 in walkway width. Gait and Pivot Turn: Pivot turns safely within 3 sec and stops quickly with no loss of balance. Step Over Obstacle: Is able to step over 2 stacked shoe boxes taped together (9 in total height) without changing gait speed. No evidence of imbalance. Gait with Narrow Base of Support: Ambulates 4-7 steps. Gait with Eyes Closed: Walks 20 ft, uses assistive device, slower speed, mild gait deviations, deviates 6-10 in outside 12 in walkway width. Ambulates 20 ft in less than 9 sec but greater than 7 sec. Ambulating Backwards: Walks 20 ft, uses assistive device, slower speed, mild gait deviations, deviates 6-10 in outside 12 in walkway width. Steps: Alternating feet, must use rail. Total Score:  25 Extremity Assessment      RLE Assessment RLE Assessment: Within Functional Limits General Strength Comments: grossly 5/5 LLE Assessment LLE Assessment: Exceptions to [facility] General Strength Comments: grossly 5/5 proximal to distal except hip flexion, ankle DF and knee flexion all 4+/5   Lorie Phenix 03/08/2022, 9:16 AM

## 2022-03-08 NOTE — Progress Notes (Signed)
Inpatient Rehabilitation Discharge Medication Review by a Pharmacist   A complete drug regimen review was completed for this patient to identify any potential clinically significant medication issues.   High Risk Drug Classes Is patient taking? Indication by Medication  Antipsychotic No    Anticoagulant No    Antibiotic No    Opioid No    Antiplatelet Yes Plavix/ASA - DAPT   Hypoglycemics/insulin No    Vasoactive Medication No Irbesartan - HTN   Chemotherapy No    Other Yes Crestor-HLD Acetaminophen - pain PRN        Type of Medication Issue Identified Description of Issue Recommendation(s)  Drug Interaction(s) (clinically significant)        Duplicate Therapy        Allergy        No Medication Administration End Date        Incorrect Dose        Additional Drug Therapy Needed        Significant med changes from prior encounter (inform family/care partners about these prior to discharge).      Other            Clinically significant medication issues were identified that warrant physician communication and completion of prescribed/recommended actions by midnight of the next day:  No     Time spent performing this drug regimen review (minutes):  [place], PharmD Pharmacy Resident  03/09/2022 10:05 AM

## 2022-03-08 NOTE — Progress Notes (Signed)
Occupational Therapy Session Note  Patient Details  Name: Charles Farley MRN: [id number] Date of Birth: [birth year]  Today's Date: 03/08/2022 OT Individual Time: 1100-1155 OT Individual Time Calculation (min): 55 min    Short Term Goals: Week 1:      Skilled Therapeutic Interventions/Progress Updates:    Pt greeted at time of session reclined bed level resting, no pain. Pt states ADL needs met and just had shower, declined ADL at this time but performed simulated tasks with MOD I throughout session. Pt performing ambulatory transfers throughout session from room <> outside to heart and vascular Independent, back to gym <> room in same manner with no LOB. Pt performing step over shower transfers independently as well and simulated home shower environment. Discussion for placement of items for bottles, etc for work simplification. Pt transferring to/from low sofa as well Independently. Pt performing multiple dynamic activities first on airex pad at [facility] for bending/reaching and improving awareness. Pt performing 1x10 modified sit ups + weighted ball over head press, 1x10 ball pick ups from ground + press, and weighted ball chest press in standing. Pt gathering, carrying, relocating various items of various weights to continue to challenge balance and functional mobility. Set up at end of session all needs met. Pt is aware of DC from OT and is eager to return home, has a good support system.   Therapy Documentation Precautions:  Precautions Precautions: Fall Precaution Comments: L hemi Restrictions Weight Bearing Restrictions: No     Therapy/Group: Individual Therapy  Viona Gilmore 03/08/2022, 7:27 AM

## 2022-03-08 NOTE — Plan of Care (Signed)
  Problem: RH Balance Goal: LTG Patient will maintain dynamic sitting balance (PT) Description: LTG:  Patient will maintain dynamic sitting balance with assistance during mobility activities (PT) Outcome: Completed/Met Goal: LTG Patient will maintain dynamic standing balance (PT) Description: LTG:  Patient will maintain dynamic standing balance with assistance during mobility activities (PT) Outcome: Completed/Met   Problem: RH Bed Mobility Goal: LTG Patient will perform bed mobility with assist (PT) Description: LTG: Patient will perform bed mobility with assistance, with/without cues (PT). Outcome: Completed/Met   Problem: RH Bed to Chair Transfers Goal: LTG Patient will perform bed/chair transfers w/assist (PT) Description: LTG: Patient will perform bed to chair transfers with assistance (PT). Outcome: Completed/Met   Problem: RH Car Transfers Goal: LTG Patient will perform car transfers with assist (PT) Description: LTG: Patient will perform car transfers with assistance (PT). Outcome: Completed/Met   Problem: RH Furniture Transfers Goal: LTG Patient will perform furniture transfers w/assist (OT/PT) Description: LTG: Patient will perform furniture transfers  with assistance (OT/PT). Outcome: Completed/Met   Problem: RH Ambulation Goal: LTG Patient will ambulate in controlled environment (PT) Description: LTG: Patient will ambulate in a controlled environment, # of feet with assistance (PT). Outcome: Completed/Met Goal: LTG Patient will ambulate in home environment (PT) Description: LTG: Patient will ambulate in home environment, # of feet with assistance (PT). Outcome: Completed/Met   Problem: RH Stairs Goal: LTG Patient will ambulate up and down stairs w/assist (PT) Description: LTG: Patient will ambulate up and down # of stairs with assistance (PT) Outcome: Completed/Met

## 2022-03-08 NOTE — Progress Notes (Signed)
Occupational Therapy Discharge Summary  Patient Details  Name: Charles Farley MRN: [id number] Date of Birth: 07-05-1970  Date of Discharge from [place] 21, 2023    Patient has met 5 of 5 long term goals due to improved activity tolerance, improved balance, postural control, functional use of  LEFT upper extremity, improved awareness, and improved coordination.  Patient to discharge at overall Modified Independent level.  Patient's care partner is independent to provide the necessary physical assistance at discharge.  Pt to DC home with spouse and son to assist as needed, pt to DC at MOD I level.  Reasons goals not met: NA  Recommendation:  No further OT recommended.  Equipment: No equipment provided  Reasons for discharge: treatment goals met and discharge from hospital  Patient/family agrees with progress made and goals achieved: Yes  OT Discharge Precautions/Restrictions  Precautions Precautions: Fall Precaution Comments: L hemi Restrictions Weight Bearing Restrictions: No Pain Pain Assessment Pain Scale: 0-10 Pain Score: 0-No pain ADL ADL Eating: Independent Grooming: Independent Upper Body Bathing: Independent Where Assessed-Upper Body Bathing: Edge of bed Lower Body Bathing: Independent Where Assessed-Lower Body Bathing: Edge of bed Upper Body Dressing: Independent Where Assessed-Upper Body Dressing: Edge of bed Lower Body Dressing: Independent Where Assessed-Lower Body Dressing: Edge of bed Toileting: Modified independent Where Assessed-Toileting: Glass blower/designer: Diplomatic Services operational officer Method: Human resources officer: IT consultant: Close supervision Social research officer, government Method: Ambulating Vision Baseline Vision/History: 1 Wears glasses Patient Visual Report: No change from baseline Vision Assessment?: Yes Eye Alignment: Within Functional Limits Ocular Range of Motion: Within Functional  Limits Alignment/Gaze Preference: Within Defined Limits Tracking/Visual Pursuits: Able to track stimulus in all quads without difficulty Saccades: Within functional limits Convergence: Within functional limits Perception  Perception: Within Functional Limits Praxis Praxis: Intact Cognition Cognition Overall Cognitive Status: Within Functional Limits for tasks assessed Arousal/Alertness: Awake/alert Orientation Level: Person;Place;Situation Person: Oriented Place: Oriented Situation: Oriented Memory: Appears intact Awareness: Appears intact Problem Solving: Appears intact Safety/Judgment: Appears intact Brief Interview for Mental Status (BIMS) Repetition of Three Words (First Attempt): 3 Temporal Orientation: Year: Correct Temporal Orientation: Month: Accurate within 5 days Temporal Orientation: Day: Correct Recall: "Sock": Yes, no cue required Recall: "Blue": Yes, no cue required Recall: "Bed": Yes, no cue required BIMS Summary Score: 15 Sensation Sensation Light Touch: Appears Intact Hot/Cold: Appears Intact Proprioception: Appears Intact Coordination Gross Motor Movements are Fluid and Coordinated: No Fine Motor Movements are Fluid and Coordinated: No Heel Shin Test: slight dysmertria on the LLE Motor  Motor Motor: Hemiplegia Motor - Skilled Clinical Observations: mild L hemiplegia Motor - Discharge Observations: mild L hemiplegia on the L side. imrpoved from Eval Mobility  Transfers Sit to Stand: Independent Stand to Sit: Independent  Trunk/Postural Assessment  Cervical Assessment Cervical Assessment: Within Functional Limits Thoracic Assessment Thoracic Assessment: Within Functional Limits Lumbar Assessment Lumbar Assessment: Within Functional Limits Postural Control Postural Control: Within Functional Limits  Balance Balance Balance Assessed: Yes Standardized Balance Assessment Standardized Balance Assessment: Functional Gait Assessment Berg Balance  Test Sit to Stand: Able to stand without using hands and stabilize independently Standing Unsupported: Able to stand safely 2 minutes Sitting with Back Unsupported but Feet Supported on Floor or Stool: Able to sit safely and securely 2 minutes Stand to Sit: Sits safely with minimal use of hands Transfers: Able to transfer safely, minor use of hands Standing Unsupported with Eyes Closed: Able to stand 10 seconds safely Standing Ubsupported with Feet Together: Able to place feet together independently and  stand 1 minute safely From Standing, Reach Forward with Outstretched Arm: Can reach forward >12 cm safely (5") From Standing Position, Pick up Object from Floor: Able to pick up shoe safely and easily From Standing Position, Turn to Look Behind Over each Shoulder: Looks behind one side only/other side shows less weight shift Turn 360 Degrees: Able to turn 360 degrees safely one side only in 4 seconds or less Standing Unsupported, Alternately Place Feet on Step/Stool: Able to stand independently and complete 8 steps >20 seconds Standing Unsupported, One Foot in Front: Able to take small step independently and hold 30 seconds Standing on One Leg: Able to lift leg independently and hold > 10 seconds Total Score: 50 Static Sitting Balance Static Sitting - Balance Support: No upper extremity supported Static Sitting - Level of Assistance: 7: Independent Dynamic Sitting Balance Dynamic Sitting - Balance Support: No upper extremity supported Dynamic Sitting - Level of Assistance: 7: Independent Static Standing Balance Static Standing - Balance Support: No upper extremity supported Static Standing - Level of Assistance: 7: Independent;6: Modified independent (Device/Increase time) Dynamic Standing Balance Dynamic Standing - Balance Support: No upper extremity supported Dynamic Standing - Level of Assistance: 7: Independent;6: Modified independent (Device/Increase time) Dynamic Standing - Comments:  intermittent instability with high level balacne activites, but able to correct without assist or cues from PT and incresaed time with stepping strategy to prevent fall Functional Gait  Assessment Gait Level Surface: Walks 20 ft in less than 5.5 sec, no assistive devices, good speed, no evidence for imbalance, normal gait pattern, deviates no more than 6 in outside of the 12 in walkway width. Change in Gait Speed: Able to smoothly change walking speed without loss of balance or gait deviation. Deviate no more than 6 in outside of the 12 in walkway width. Gait with Horizontal Head Turns: Performs head turns smoothly with no change in gait. Deviates no more than 6 in outside 12 in walkway width Gait with Vertical Head Turns: Performs head turns with no change in gait. Deviates no more than 6 in outside 12 in walkway width. Gait and Pivot Turn: Pivot turns safely within 3 sec and stops quickly with no loss of balance. Step Over Obstacle: Is able to step over 2 stacked shoe boxes taped together (9 in total height) without changing gait speed. No evidence of imbalance. Gait with Narrow Base of Support: Ambulates 4-7 steps. Gait with Eyes Closed: Walks 20 ft, uses assistive device, slower speed, mild gait deviations, deviates 6-10 in outside 12 in walkway width. Ambulates 20 ft in less than 9 sec but greater than 7 sec. Ambulating Backwards: Walks 20 ft, uses assistive device, slower speed, mild gait deviations, deviates 6-10 in outside 12 in walkway width. Steps: Alternating feet, must use rail. Total Score: 25 Extremity/Trunk Assessment RUE Assessment RUE Assessment: Within Functional Limits LUE Assessment LUE Assessment: Exceptions to [facility] LUE Body System: Neuro Brunstrum levels for arm and hand: Arm;Hand Brunstrum level for arm: Stage V Relative Independence from Synergy Brunstrum level for hand: Stage VI Isolated joint movements   Viona Gilmore 03/08/2022, 12:09 PM

## 2022-03-14 DIAGNOSIS — I679 Cerebrovascular disease, unspecified: Secondary | ICD-10-CM | POA: Diagnosis not present

## 2022-03-14 DIAGNOSIS — B353 Tinea pedis: Secondary | ICD-10-CM | POA: Diagnosis not present

## 2022-03-14 DIAGNOSIS — R5383 Other fatigue: Secondary | ICD-10-CM | POA: Diagnosis not present

## 2022-03-14 DIAGNOSIS — E785 Hyperlipidemia, unspecified: Secondary | ICD-10-CM | POA: Diagnosis not present

## 2022-03-14 DIAGNOSIS — R059 Cough, unspecified: Secondary | ICD-10-CM | POA: Diagnosis not present

## 2022-03-14 DIAGNOSIS — I129 Hypertensive chronic kidney disease with stage 1 through stage 4 chronic kidney disease, or unspecified chronic kidney disease: Secondary | ICD-10-CM | POA: Diagnosis not present

## 2022-03-18 ENCOUNTER — Encounter: Payer: Self-pay | Admitting: Neurology

## 2022-03-18 ENCOUNTER — Encounter: Payer: 59 | Attending: Registered Nurse | Admitting: Registered Nurse

## 2022-03-18 ENCOUNTER — Ambulatory Visit: Payer: 59 | Admitting: Neurology

## 2022-03-18 ENCOUNTER — Encounter: Payer: Self-pay | Admitting: Registered Nurse

## 2022-03-18 VITALS — BP 126/84 | HR 90 | Ht 72.0 in | Wt 318.0 lb

## 2022-03-18 DIAGNOSIS — Z68.41 Body mass index (BMI) pediatric, greater than or equal to 95th percentile for age: Secondary | ICD-10-CM

## 2022-03-18 DIAGNOSIS — I6381 Other cerebral infarction due to occlusion or stenosis of small artery: Secondary | ICD-10-CM

## 2022-03-18 DIAGNOSIS — E7849 Other hyperlipidemia: Secondary | ICD-10-CM | POA: Diagnosis not present

## 2022-03-18 DIAGNOSIS — I639 Cerebral infarction, unspecified: Secondary | ICD-10-CM

## 2022-03-18 DIAGNOSIS — I10 Essential (primary) hypertension: Secondary | ICD-10-CM | POA: Insufficient documentation

## 2022-03-18 DIAGNOSIS — U071 COVID-19: Secondary | ICD-10-CM | POA: Diagnosis not present

## 2022-03-18 DIAGNOSIS — R0683 Snoring: Secondary | ICD-10-CM | POA: Diagnosis not present

## 2022-03-18 DIAGNOSIS — R300 Dysuria: Secondary | ICD-10-CM | POA: Diagnosis not present

## 2022-03-18 NOTE — Progress Notes (Signed)
Subjective:    Patient ID: Charles Farley, male    DOB: Jan 08, 1971, 51 y.o.   MRN: [id number]  HPI: Charles Farley is a 51 y.o. male who is here for HFU appointment for F/U of his Subcortical Infarction, Essential Hypertension and Hyperlipidemia. [facility]2022 with left sided numbness and weakness.  Dr. Particia Nearing H&P Pt is a 51 yo male with a pmhx significant for HTN and ADHD.  Pt said he had some left arm weakness and numbness when he woke up this am.  Pt said it seemed to come and go until about noon when it seems to have stuck around.  His wife said he had some expressive aphasia around noon, but that has resolved.  Pt now has some heaviness in his left leg.  Pt has been having balance issues as well since noon.  Pt did test + for Covid 11 days ago.  Those sx are gone.  He has just moved and his bp meds are in a box somewhere, so he has not taken his bp meds in a few days.  He later admits that it's been months since he's taken any meds.  He is not on blood thinners.  CT Head: WO Contrast:  IMPRESSION: No acute intracranial pathology. Aspects score of 10.  CTA:  IMPRESSION: Normal CTA of the head and neck  MR Brain: WO Contrast:  IMPRESSION: Acute infarct in the posterior limb of the right internal capsule. No hemorrhage.  Neurology was consulted  He was maintained on ASA and Plavix x 3 weeks then ASA alone. Charles Farley seen Dr. Vickey Huger this morning, note was reviewed.   Charles Farley social work discharge summary states to F/U with [facility], he states he was instructed to continue with HEP as tolerated. E-Mail was sent to [facility] SW and Grier Rocher physical therapist from inpatient rehabilitation, awaiting a response.   Charles Farley would like to go to work, this was discussed with Dr Wynn Banker. After receiving reply form social worker and therapist, he most likely will be able to return to work on Monday 03/24/2022 part-time for 2  weeks, per Dr Wynn Banker. This was discussed with Charles Farley. He will speak with his employer and this provider will give him a call on tomorrow. He verbalizes understanding.   Charles Farley was admitted to inpatient rehabilitation on 03/05/2022 and discharged home on 03/09/2022. He denies any pain. He rates his pain 0. Also reports he has a good appetite.       Pain Inventory Average Pain 0 Pain Right Now 0 My pain is  NO PAIN  LOCATION OF PAIN  NO PAIN  BOWEL Number of stools per week: 7   BLADDER Normal Possible UTI - To follow up with PCP    Mobility walk with assistance how many minutes can you walk? No problem walking  ability to climb steps?  yes do you drive?  yes  Function employed # of hrs/week on medical leave  Neuro/Psych No problems in this area  Prior Studies Any changes since last visit?  no  Physicians involved in your care Any changes since last visit?  no   Family History  Problem Relation Age of Onset  . Aneurysm Mother   . Heart disease Father    Social History   Socioeconomic History  . Marital status: Married    Spouse name: Not on file  . Number of children: Not on file  .  Years of education: Not on file  . Highest education level: Not on file  Occupational History  . Not on file  Tobacco Use  . Smoking status: Never  . Smokeless tobacco: Never  Vaping Use  . Vaping Use: Never used  Substance and Sexual Activity  . Alcohol use: Not Currently  . Drug use: Never  . Sexual activity: Not on file  Other Topics Concern  . Not on file  Social History Narrative  . Not on file   Social Determinants of Health   Financial Resource Strain: Not on file  Food Insecurity: Not on file  Transportation Needs: Not on file  Physical Activity: Not on file  Stress: Not on file  Social Connections: Not on file   Past Surgical History:  Procedure Laterality Date  . TONSILECTOMY/ADENOIDECTOMY WITH MYRINGOTOMY     Past Medical History:   Diagnosis Date  . ADHD   . HLD (hyperlipidemia)   . Hypertension   . Stroke ([place])    BP 126/84   Pulse 90   Ht 6' (1.829 m)   Wt (!) 318 lb (144.2 kg)   SpO2 96%   BMI 43.13 kg/m   Opioid Risk Score:   Fall Risk Score:  `1  Depression screen [facility] 2/9     03/18/2022    2:54 PM  Depression screen PHQ 2/9  Decreased Interest 0  Down, Depressed, Hopeless 0  PHQ - 2 Score 0  Altered sleeping 0  Tired, decreased energy 0  Change in appetite 1  Feeling bad or failure about yourself  0  Trouble concentrating 0  Moving slowly or fidgety/restless 0  Suicidal thoughts 0  PHQ-9 Score 1    Review of Systems  All other systems reviewed and are negative.      Objective:   Physical Exam Vitals and nursing note reviewed.  Constitutional:      Appearance: Normal appearance. He is obese.  Cardiovascular:     Rate and Rhythm: Normal rate and regular rhythm.     Pulses: Normal pulses.     Heart sounds: Normal heart sounds.  Pulmonary:     Effort: Pulmonary effort is normal.     Breath sounds: Normal breath sounds.  Musculoskeletal:     Cervical back: Normal range of motion and neck supple.     Comments: Normal Muscle Bulk and Muscle Testing Reveals:  Upper Extremities: Full ROM and Muscle Strength 5/5 Lower Extremities: Full ROM and Muscle Strength 5/5 Arises from Table with ease Narrow Based  Gait     Skin:    General: Skin is warm and dry.  Neurological:     Mental Status: He is alert and oriented to person, place, and time.  Psychiatric:        Mood and Affect: Mood normal.        Behavior: Behavior normal.         Assessment & Plan:  Subcortical Infarction: Continue current medication regimen. Neurology is following. Continue HEP, e-mail sent to SW and PT for clarification.  2.  Essential Hypertension: Continue current medication regimen. PCP following . Continue to monitor.  3. Hyperlipidemia: Continue current medication regimen. PCP following. Continue to  monitor.   F/U with Dr Letta Pate in 4- 6 weeks

## 2022-03-18 NOTE — Progress Notes (Signed)
Provider:  Larey Farley, M D  Referring Provider: Rosalin Hawking, MD Primary Care Physician:  Charles Hibbs, NP  Chief Complaint  Patient presents with   Follow-up    Pt alone, mr 10. Presents today post hospital dc for stroke. He went to inpt rehab and states he has regained most strength back. He is close to feeling like baseline. He has a follow up apt with the rehab MD today as well and they can determine if further outpatient therpay is needed. His PCP doesn't think that will be the case. Patient had covid prior to the stroke which still has a lingering cough from that.    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 Erlinda Farley .  He is not known to me, presents as a new patient.   Presents today post hospital dc for stroke. He went to inpt rehab and states he has regained most strength back. He is close to feeling like baseline. He has a follow up apt with the rehab MD today as well and they can determine if further outpatient therpay is needed. His PCP doesn't think that will be the case. Patient had covid prior to the stroke which still has a lingering cough from that.  Charles Farley described that he was affected by COVID and had only been back at work for 2 days when he suffered a stroke.  He has a history of high lipids, hypertension, he presented to the emergency room on 03-01-2022 and was evaluated by the code stroke team.  He stayed in hospital for 4 days was discharged on the 18th.  The patient is a non-smoker he does not use alcohol and he has no illegal drug use history.  He is married, he is gainfully full-time employed.  He has been treated for ADHD and hypertension in the past.    When he presented to the emergency room he felt slightly out of balance his left leg was weaker and his left arm and hand were clumsy and felt heavy.  He felt this for over 5 hours- He did not have aphasia or any difficulties with  speech or swallowing there was no nausea no vertigo vomiting or headache.  And he was not on aspirin for any treatment before he came.  He had not been taking valsartan and Crestor for a while and that may have been related to his coughing and his COVID infection.  HbA1c was 5.1 so the patient is definitely not diabetic, LDL was 142, 2D echo was obtained the ejection fraction was between 60 and 65% which is normal blood pressure on arrival at [facility] was 172/79 and had to gradually been brought down.  Modified Rankin scale was 0 and he was not giving thrombolysis as his symptoms were already present for over 5 hours.  CT angio of the head and neck was ordered, there was no calcific atherosclerosis in the aortic arch no aneurysm, subclavian arteries were widely patent, right and left carotid system were normal, vertebral arteries were clearly patent no dissection, posterior sick sick circulation including vertebral arteries inferior cerebellar artery and basilar artery all looked normal superior cerebellar artery posterior cerebellar artery looked normal anterior circulation was normal no evidence of venous sinus thrombosis, it was a completely normal and clear CTA of head and neck.  An MRI of the brain followed on 10-15  23 and showed an acute infarct in the posterior limb of the right internal capsule that this is a very small area of densely packed neurons and would be a branch infarct so a smaller vessel of the brain had been obliterated.  He wore a heart monitor which did not capture any atrial fibrillation, his risk factors are his BMI which is almost 43, is still elevated blood pressures, the very recent COVID infection and snoring was reported by his wife.  He reports restorative and refreshing sleep, he does not wake up by his own snoring, his wife has not stated that he has apnea and stops breathing at night.  He does wake up without headaches sometimes he will have some numbness or  tingling depending on how he slept position.  He reports 1 time nocturia rarely more, he has no trouble going back to sleep, he estimates that his overall sleep time at night is between 6 and 8 hours.  He wakes up without headaches or dry mouth.     Review of Systems: Out of a complete 14 system review, the patient complains of only the following symptoms, and all other reviewed systems are negative. Transient left arm and left leg involvement weakness and very transient expressive aphasia that at the time of his presentation in the emergency room was already dissolved.  His wife had noticed speech changes when she communicated to him over the phone however there was no problem in expressing himself he did not look forward or replace words it was more of a clarity of speech issue so I wonder if this was more dysphonia.  How likely are you to doze in the following situations: 0 = not likely, 1 = slight chance, 2 = moderate chance, 3 = high chance  Sitting and Reading? 1 Watching Television? 1 Sitting inactive in a public place (theater or meeting)?0 Lying down in the afternoon when circumstances permit?3 Sitting and talking to someone?0 Sitting quietly after lunch without alcohol?1 In a car, while stopped for a few minutes in traffic?1 As a passenger in a car for an hour without a break?1  Total =8/ 24  FSS not obtained.    Social History   Socioeconomic History   Marital status: Married    Spouse name: Not on file   Number of children: Not on file   Years of education: Not on file   Highest education level: Not on file  Occupational History   Not on file  Tobacco Use   Smoking status: Never   Smokeless tobacco: Never  Substance and Sexual Activity   Alcohol use: Not Currently   Drug use: Never   Sexual activity: Not on file  Other Topics Concern   Not on file  Social History Narrative   Not on file   Social Determinants of Health   Financial Resource Strain: Not on  file  Food Insecurity: Not on file  Transportation Needs: Not on file  Physical Activity: Not on file  Stress: Not on file  Social Connections: Not on file  Intimate Partner Violence: Not on file    Family History  Problem Relation Age of Onset   Aneurysm Mother    Heart disease Father     Past Medical History:  Diagnosis Date   ADHD    HLD (hyperlipidemia)    Hypertension    Stroke [facility])     Past Surgical History:  Procedure Laterality Date   TONSILECTOMY/ADENOIDECTOMY WITH MYRINGOTOMY  Current Outpatient Medications  Medication Sig Dispense Refill   acetaminophen (TYLENOL) 325 MG tablet Take 2 tablets (650 mg total) by mouth every 4 (four) hours as needed for mild pain (or temp > 37.5 C (99.5 F)).     amLODipine (NORVASC) 5 MG tablet Take 5 mg by mouth daily.     aspirin EC 81 MG tablet Take 1 tablet (81 mg total) by mouth daily. Swallow whole. 30 tablet 1   clopidogrel (PLAVIX) 75 MG tablet Take 1 tablet (75 mg total) by mouth daily. 14 tablet 0   irbesartan (AVAPRO) 300 MG tablet Take 300 mg by mouth daily.     rosuvastatin (CRESTOR) 40 MG tablet Take 1 tablet (40 mg total) by mouth daily. 30 tablet 1   No current facility-administered medications for this visit.    Allergies as of 03/18/2022   (No Known Allergies)    Vitals: BP (!) 188/95   Pulse 85   Ht 6' (1.829 m)   Wt (!) 316 lb (143.3 kg)   BMI 42.86 kg/m  Last Weight:  Wt Readings from Last 1 Encounters:  03/18/22 (!) 316 lb (143.3 kg)   Last Height:   Ht Readings from Last 1 Encounters:  03/18/22 6' (1.829 m)    Physical exam:  General: The patient is awake, alert and appears not in acute distress. The patient is well groomed. Head: Normocephalic, atraumatic.   Neck is supple. Mallampati 3, neck circumference:20.25 " Cardiovascular:  Regular rate and rhythm, without  murmurs or carotid bruit, and without distended neck veins. Respiratory: Lungs are clear to auscultation. Skin:   Without evidence of edema, or rash Trunk: BMI is 42 and therefor class 3 elevated.  Neurologic exam : The patient is awake and alert, oriented to place and time.  Memory subjective  described as intact. There is a normal attention span & concentration ability. Speech is fluent without  dysarthria, dysphonia or aphasia. Mood and affect are appropriate.  Cranial nerves: Pupils are equal and briskly reactive to light. Funduscopic exam without  evidence of pallor or edema. Extraocular movements  in vertical and horizontal planes intact and without nystagmus.  Visual fields by finger perimetry are intact. Hearing to finger rub intact.  Facial sensation intact to fine touch. Facial motor strength is symmetric and tongue and uvula move midline. Tongue protrusion into either cheek is normal. Shoulder shrug is normal.   Motor exam:   Normal tone ,muscle bulk . There was lesser grip strength in the left hand. No pronatordrift !   Sensory:  Fine touch, pinprick and vibration were tested in all extremities.  Proprioception was normal.  Coordination: Rapid alternating movements in the fingers/hands were normal.  Finger-to-nose maneuver normal without evidence of ataxia, dysmetria or tremor.  Gait and station: Patient walks without assistive device . Proximal and distal strength within normal limits. Stance is stable.  Tandem gait is unfragmented.  Romberg testing is negative   Deep tendon reflexes: in the  upper and lower extremities are symmetric and intact. Babinski maneuver response is up-going on the left .   Assessment:  After physical and neurologic examination, review of laboratory studies, imaging, neurophysiology testing and pre-existing records, assessment is that of :   Small  vessel stroke to the right sided  capsula interna, posterior limb. This is a small perforating artery and since CT angio did not show arthrosclerotic changes, I suspect the BP to be the culprit.   No DM. Has  started on Crestor. Negative  cardiac monitor, no atrial fib.   Plan:  Treatment plan and additional workup :  This acute stroke that the patient suffered on 03-01-2022 is related to a small perforating artery of the right internal capsule only the posterior limb is affected.   The main risk factors for strokes in vessels of this size are hypertension, diabetes, hyperlipidemia.  Hypertension is still not well controlled but needs to be in order to prevent future events with a negative outcome on cognition, motor ability and employment.  He was seen last Friday by his primary care physician, who doubled to the irbesartan dose and offered to add amlodipine-Norvasc if blood pressure is not reduced in time.  Looking at today's blood pressure I think we have to go to add Norvasc immediately.     I want him to be on a baby aspirin enteric-coated every day, for 6 weeks overlapping with PLavix. and I would like to refer him for a weight loss program since this is one of his main risk factors.    I will also order a home sleep test to just rule out that there is any apnea as this can be one of the risk factors and cryptogenic stroke.  He is a Human resources officer,  5 days 8 hours, changed to day shift in 2019.    Rv in 3-4 months with NP.   Continue ASA, continue Crestor, start higher dose of BP medication and add Amlopdipine, goal rate for BP is 130/ 80 mmHg   Charles Seat MD 03/18/2022

## 2022-03-18 NOTE — Patient Instructions (Addendum)
Clopidogrel Tablets What is this medication? CLOPIDOGREL (kloh PID oh grel) lowers the risk of heart attack, stroke, or blood clots. It prevents blood cells (platelets) from clumping together to form a clot. It belongs to a group of medications called antiplatelets. This medicine may be used for other purposes; ask your health care provider or pharmacist if you have questions. COMMON BRAND NAME(S): Plavix What should I tell my care team before I take this medication? They need to know if you have any of the following conditions: Bleeding disorders Bleeding in the brain Having surgery History of stomach bleeding An unusual or allergic reaction to clopidogrel, other medications, foods, dyes, or preservatives Pregnant or trying to get pregnant Breast-feeding How should I use this medication? Take this medication by mouth with a glass of water. Follow the directions on the prescription label. You may take this medication with or without food. If it upsets your stomach, take it with food. Take your medication at regular intervals. Do not take it more often than directed. Do not stop taking except on your care team's advice. A special MedGuide will be given to you by the pharmacist with each prescription and refill. Be sure to read this information carefully each time. Talk to your care team about the use of this medication in children. Special care may be needed. Overdosage: If you think you have taken too much of this medicine contact a poison control center or emergency room at once. NOTE: This medicine is only for you. Do not share this medicine with others. What if I miss a dose? If you miss a dose, take it as soon as you can. If it is almost time for your next dose, take only that dose. Do not take double or extra doses. What may interact with this medication? Do not take this medication with the following: Dasabuvir; ombitasvir; paritaprevir; ritonavir Defibrotide Selexipag This  medication may also interact with the following: Certain medications that prevent or treat blood clots, such as warfarin NSAIDs, medications for pain and inflammation, such as ibuprofen or naproxen Opioid medications for pain Repaglinide SNRIs, medications for depression, such as desvenlafaxine, duloxetine, levomilnacipran, venlafaxine SSRIs, medications for depression, such as citalopram, escitalopram, fluoxetine, fluvoxamine, paroxetine, sertraline Stomach acid blockers, such as cimetidine, esomeprazole, omeprazole This list may not describe all possible interactions. Give your health care provider a list of all the medicines, herbs, non-prescription drugs, or dietary supplements you use. Also tell them if you smoke, drink alcohol, or use illegal drugs. Some items may interact with your medicine. What should I watch for while using this medication? Visit your care team for regular check-ups. Do not stop taking your medication unless your care team tells you to. Notify your care team and seek emergency services if you develop sudden numbness or weakness of the face, arm, or leg, trouble speaking, confusion, trouble walking, loss of balance or coordination, dizziness, severe headache, or change in vision. These can be signs that your condition has gotten worse. If you are going to have surgery or dental work, tell your care team that you are taking this medication. Certain genetic factors may reduce the effect of this medication. Your care team may use genetic tests to determine treatment. Only take aspirin if you are instructed to. Low doses of aspirin are used with this medication to treat some conditions. Taking aspirin with this medication can increase your risk of bleeding, so you must be careful. Talk to your care team if you have questions. What side  effects may I notice from receiving this medication? Side effects that you should report to your care team as soon as possible: Allergic  reactions--skin rash, itching, hives, swelling of the face, lips, tongue, or throat Bleeding--bloody or black, tar-like stools, red or dark brown urine, vomiting blood or brown material that looks like coffee grounds, small, red or purple spots on the skin, unusual bleeding or bruising TTP--purple spots on the skin or inside the mouth, pale skin, yellowing skin or eyes, unusual weakness or fatigue, fever, fast or irregular heartbeat, confusion, change in vision, trouble speaking, trouble walking Side effects that usually do not require medical attention (report to your care team if they continue or are bothersome): Diarrhea Headache This list may not describe all possible side effects. Call your doctor for medical advice about side effects. You may report side effects to FDA at 1-800-FDA-1088. Where should I keep my medication? Keep out of the reach of children and pets. Store at room temperature of 59 to 86 degrees F (15 to 30 degrees C). Throw away any unused medication after the expiration date. NOTE: This sheet is a summary. It may not cover all possible information. If you have questions about this medicine, talk to your doctor, pharmacist, or health care provider.  2023 Elsevier/Gold Standard (2007-06-26 00:00:00)   Stroke Prevention Some medical conditions and lifestyle choices can lead to a higher risk for a stroke. You can help to prevent a stroke by eating healthy foods and exercising. It also helps to not smoke and to manage any health problems you may have. How can this condition affect me? A stroke is an emergency. It should be treated right away. A stroke can lead to brain damage or threaten your life. There is a better chance of surviving and getting better after a stroke if you get medical help right away. What can increase my risk? The following medical conditions may increase your risk of a stroke: Diseases of the heart and blood vessels (cardiovascular disease). High blood  pressure (hypertension). Diabetes. High cholesterol. Sickle cell disease. Problems with blood clotting. Being very overweight. Sleeping problems (obstructivesleep apnea). Other risk factors include: Being older than age 69. A history of blood clots, stroke, or mini-stroke (TIA). Race, ethnic background, or a family history of stroke. Smoking or using tobacco products. Taking birth control pills, especially if you smoke. Heavy alcohol and drug use. Not being active. What actions can I take to prevent this? Manage your health conditions High cholesterol. Eat a healthy diet. If this is not enough to manage your cholesterol, you may need to take medicines. Take medicines as told by your doctor. High blood pressure. Try to keep your blood pressure below 130/80. If your blood pressure cannot be managed through a healthy diet and regular exercise, you may need to take medicines. Take medicines as told by your doctor. Ask your doctor if you should check your blood pressure at home. Have your blood pressure checked every year. Diabetes. Eat a healthy diet and get regular exercise. If your blood sugar (glucose) cannot be managed through diet and exercise, you may need to take medicines. Take medicines as told by your doctor. Talk to your doctor about getting checked for sleeping problems. Signs of a problem can include: Snoring a lot. Feeling very tired. Make sure that you manage any other conditions you have. Nutrition  Follow instructions from your doctor about what to eat or drink. You may be told to: Eat and drink fewer calories each  day. Limit how much salt (sodium) you use to 1,500 milligrams (mg) each day. Use only healthy fats for cooking, such as olive oil, canola oil, and sunflower oil. Eat healthy foods. To do this: Choose foods that are high in fiber. These include whole grains, and fresh fruits and vegetables. Eat at least 5 servings of fruits and vegetables a day. Try to  fill one-half of your plate with fruits and vegetables at each meal. Choose low-fat (lean) proteins. These include low-fat cuts of meat, chicken without skin, fish, tofu, beans, and nuts. Eat low-fat dairy products. Avoid foods that: Are high in salt. Have saturated fat. Have trans fat. Have cholesterol. Are processed or pre-made. Count how many carbohydrates you eat and drink each day. Lifestyle If you drink alcohol: Limit how much you have to: 0-1 drink a day for women who are not pregnant. 0-2 drinks a day for men. Know how much alcohol is in your drink. In the [place] one drink equals one 12 oz bottle of beer (322mL), one 5 oz glass of wine (191mL), or one 1 oz glass of hard liquor (36mL). Do not smoke or use any products that have nicotine or tobacco. If you need help quitting, ask your doctor. Avoid secondhand smoke. Do not use drugs. Activity  Try to stay at a healthy weight. Get at least 30 minutes of exercise on most days, such as: Fast walking. Biking. Swimming. Medicines Take over-the-counter and prescription medicines only as told by your doctor. Avoid taking birth control pills. Talk to your doctor about the risks of taking birth control pills if: You are over 24 years old. You smoke. You get very bad headaches. You have had a blood clot. Where to find more information American Stroke Association: www.strokeassociation.org Get help right away if: You or a loved one has any signs of a stroke. "BE FAST" is an easy way to remember the warning signs: B - Balance. Dizziness, sudden trouble walking, or loss of balance. E - Eyes. Trouble seeing or a change in how you see. F - Face. Sudden weakness or loss of feeling of the face. The face or eyelid may droop on one side. A - Arms. Weakness or loss of feeling in an arm. This happens all of a sudden and most often on one side of the body. S - Speech. Sudden trouble speaking, slurred speech, or trouble understanding what  people say. T - Time. Time to call emergency services. Write down what time symptoms started. You or a loved one has other signs of a stroke, such as: A sudden, very bad headache with no known cause. Feeling like you may vomit (nausea). Vomiting. A seizure. These symptoms may be an emergency. Get help right away. Call your local emergency services (911 in the [place] Do not wait to see if the symptoms will go away. Do not drive yourself to the hospital. Summary You can help to prevent a stroke by eating healthy, exercising, and not smoking. It also helps to manage any health problems you have. Do not smoke or use any products that contain nicotine or tobacco. Get help right away if you or a loved one has any signs of a stroke. This information is not intended to replace advice given to you by your health care provider. Make sure you discuss any questions you have with your health care provider. Document Revised: 12/05/2019 Document Reviewed: 12/05/2019 Elsevier Patient Education  [place].

## 2022-03-21 ENCOUNTER — Telehealth: Payer: Self-pay | Admitting: Registered Nurse

## 2022-03-21 DIAGNOSIS — I639 Cerebral infarction, unspecified: Secondary | ICD-10-CM

## 2022-03-21 NOTE — Telephone Encounter (Signed)
Physical Therapist note was reviewed. Call placed to Mr. Schurman, awaiting return call.

## 2022-03-21 NOTE — Telephone Encounter (Signed)
Spoke with Charles Farley.  Referral placed for Physical therapy, he verbalizes understanding.

## 2022-03-25 ENCOUNTER — Ambulatory Visit: Payer: 59 | Attending: Registered Nurse | Admitting: Physical Therapy

## 2022-03-25 ENCOUNTER — Encounter: Payer: Self-pay | Admitting: Physical Therapy

## 2022-03-25 ENCOUNTER — Other Ambulatory Visit: Payer: Self-pay

## 2022-03-25 VITALS — BP 153/64 | HR 90

## 2022-03-25 DIAGNOSIS — I639 Cerebral infarction, unspecified: Secondary | ICD-10-CM | POA: Diagnosis not present

## 2022-03-25 DIAGNOSIS — N401 Enlarged prostate with lower urinary tract symptoms: Secondary | ICD-10-CM | POA: Diagnosis not present

## 2022-03-25 DIAGNOSIS — I69354 Hemiplegia and hemiparesis following cerebral infarction affecting left non-dominant side: Secondary | ICD-10-CM | POA: Insufficient documentation

## 2022-03-25 DIAGNOSIS — R059 Cough, unspecified: Secondary | ICD-10-CM | POA: Diagnosis not present

## 2022-03-25 DIAGNOSIS — I129 Hypertensive chronic kidney disease with stage 1 through stage 4 chronic kidney disease, or unspecified chronic kidney disease: Secondary | ICD-10-CM | POA: Diagnosis not present

## 2022-03-25 NOTE — Therapy (Unsigned)
OUTPATIENT PHYSICAL THERAPY NEURO EVALUATION   Patient Name: Charles Farley MRN: [id number] DOB:01-24-1971, 51 y.o., male 82 Date: 03/26/2022   PCP: Effie Shy, MD REFERRING PROVIDER: Jones Bales, NP   PT End of Session - 03/25/22 1124     Visit Number 1    Authorization Type [place] [employer]    PT Start Time 1115   PT ran over with pt prior   PT Stop Time 1148    PT Time Calculation (min) 33 min    Activity Tolerance Patient tolerated treatment well    Behavior During Therapy [facility] for tasks assessed/performed             Past Medical History:  Diagnosis Date   ADHD    HLD (hyperlipidemia)    Hypertension    Stroke [facility])    Past Surgical History:  Procedure Laterality Date   TONSILECTOMY/ADENOIDECTOMY WITH MYRINGOTOMY     Patient Active Problem List   Diagnosis Date Noted   Ischemic stroke diagnosed during current admission (HCC) 03/18/2022   Subcortical infarction (HCC) 03/03/2022   HTN (hypertension) 03/02/2022   HLD (hyperlipidemia) 03/02/2022   Persistent adjustment disorder 02/12/2020   ADHD (attention deficit hyperactivity disorder) 02/12/2020   Passive suicidal ideations 02/12/2020    ONSET DATE: 03/21/2022 (referral)  REFERRING DIAG: I63.9 (ICD-10-CM) - Subcortical infarction (HCC)  THERAPY DIAG:  Hemiplegia and hemiparesis following cerebral infarction affecting left non-dominant side (HCC)  Rationale for Evaluation and Treatment: Rehabilitation  SUBJECTIVE:                                                                                                                                                                                             SUBJECTIVE STATEMENT: Pt feels he has no lingering deficits and that he is back at his baseline.  He reports no falls and he has been climbing stairs just fine using rail if needed.  Wife feels his endurance is slowly coming back and that he has made steady improvement since leaving inpatient  rehab. Pt accompanied by: significant other-Wife Erica  PERTINENT HISTORY: HTN, HLD, ADHD Admitted to hospital on 10/14 with subcortical infarction, brief stay in inpatient rehab 10/18-10/21.  PAIN:  Are you having pain? No  PRECAUTIONS: Fall  WEIGHT BEARING RESTRICTIONS: No  FALLS: Has patient fallen in last 6 months? No  LIVING ENVIRONMENT: Lives with: lives with their spouse and 2 adult children, small pets Lives in: House/apartment Stairs: Yes: Internal: 14-16 steps; can reach both and External: 4 steps; can reach both Has following equipment at home: None  PLOF: Independent  PATIENT GOALS: "To have evaluation done so  I can return to work."  OBJECTIVE:   DIAGNOSTIC FINDINGS: Brain MRI 03/03/2022:  IMPRESSION: Acute infarct in the posterior limb of the right internal capsule. No hemorrhage.  COGNITION: Overall cognitive status: Within functional limits for tasks assessed   SENSATION: WFL  COORDINATION: LE RAMS:  WFL Heel-to-shin:  Normal bilaterally  EDEMA:  None noted or reported  MUSCLE TONE: None noted in BLE  POSTURE: rounded shoulders and forward head  LOWER EXTREMITY ROM:     Active  Right Eval Left Eval  Hip flexion [facility]  Hip extension    Hip abduction    Hip adduction    Hip internal rotation    Hip external rotation    Knee flexion    Knee extension " "  Ankle dorsiflexion " "  Ankle plantarflexion    Ankle inversion    Ankle eversion     (Blank rows = not tested)  LOWER EXTREMITY MMT:    MMT Right Eval Left Eval  Hip flexion 5/5 5/5  Hip extension    Hip abduction 5/5 5/5  Hip adduction 5/5 5/5  Hip internal rotation    Hip external rotation    Knee flexion    Knee extension 5/5 5/5  Ankle dorsiflexion 5/5 5/5  Ankle plantarflexion    Ankle inversion    Ankle eversion    (Blank rows = not tested)  BED MOBILITY:  Pt reports WNL  TRANSFERS: Assistive device utilized: None  Sit to stand: Complete  Independence Stand to sit: Complete Independence Chair to chair: Complete Independence  STAIRS: Level of Assistance: Modified independence Stair Negotiation Technique: Alternating Pattern  with Bilateral Rails Number of Stairs: 4  Height of Stairs: 6"   GAIT: Gait pattern: WFL Distance walked: various clinic distances Assistive device utilized: None Level of assistance: Complete Independence  FUNCTIONAL TESTS:  Functional gait assessment: 26/30  PATIENT SURVEYS:  None relevant to patient issues.  TODAY'S TREATMENT:                                                                                                                              DATE: N/A    PATIENT EDUCATION: Education details: PT POC, being cleared by MD to return to work, and things to work on at home (walking 5 days a week working towards 20 minutes continuously and heel-to-toe walking at [employer]). Person educated: Patient and Spouse Education method: Explanation Education comprehension: verbalized understanding  HOME EXERCISE PROGRAM: N/A  ASSESSMENT:  CLINICAL IMPRESSION: Patient is a 51 y.o. male who was seen today for physical therapy evaluation and treatment for subcortical infarction involving the posterior limb of the right internal capsule.  He has a significant PMH including HTN, HLD, and ADHD.  Pt presents this visit with no significant deficits.  Postural deficits appear to be baseline for patient.  Pt scored a 26/30 on the FGA this visit indicating a low fall risk.  PT provided recommendations of things noted  in assessment to work on that might further help prepare pt to return to work.  From a physical standpoint pt is meeting ADL and IADL requirements at home and demonstrates adequate balance, gait speed, and tolerance to upright mobility unassisted this session.  If MD in agreement, pt should be able to return to work and PT encouraged pt to speak with neurologist about formal clearance for this.   At this time, he requires no skilled PT services at the outpatient level.  CLINICAL DECISION MAKING: Stable/uncomplicated  EVALUATION COMPLEXITY: Low  PLAN:  PT FREQUENCY: one time visit  PT DURATION: other: 1x visit  Sadie Haber, PT, DPT 03/26/2022, 7:31 AM

## 2022-03-26 ENCOUNTER — Other Ambulatory Visit (HOSPITAL_BASED_OUTPATIENT_CLINIC_OR_DEPARTMENT_OTHER): Payer: Self-pay

## 2022-03-26 ENCOUNTER — Telehealth: Payer: Self-pay

## 2022-03-26 MED ORDER — FLUARIX QUADRIVALENT 0.5 ML IM SUSY
PREFILLED_SYRINGE | INTRAMUSCULAR | 0 refills | Status: DC
  Filled 2022-03-26: qty 0.5, 1d supply, fill #0

## 2022-03-26 NOTE — Telephone Encounter (Signed)
Patient stated he was to call you back after his Neuro appointment:   Per Mr. Charles Farley he received the okay to return to work from Neurology. He now needs a letter from you to go back to work. He wants to go back part-time starting tomorrow or Friday.   Call back phone [phone].

## 2022-03-31 ENCOUNTER — Other Ambulatory Visit (HOSPITAL_BASED_OUTPATIENT_CLINIC_OR_DEPARTMENT_OTHER): Payer: Self-pay

## 2022-04-04 DIAGNOSIS — I129 Hypertensive chronic kidney disease with stage 1 through stage 4 chronic kidney disease, or unspecified chronic kidney disease: Secondary | ICD-10-CM | POA: Diagnosis not present

## 2022-04-04 DIAGNOSIS — E785 Hyperlipidemia, unspecified: Secondary | ICD-10-CM | POA: Diagnosis not present

## 2022-04-14 DIAGNOSIS — N401 Enlarged prostate with lower urinary tract symptoms: Secondary | ICD-10-CM | POA: Diagnosis not present

## 2022-04-14 DIAGNOSIS — E785 Hyperlipidemia, unspecified: Secondary | ICD-10-CM | POA: Diagnosis not present

## 2022-04-14 DIAGNOSIS — I129 Hypertensive chronic kidney disease with stage 1 through stage 4 chronic kidney disease, or unspecified chronic kidney disease: Secondary | ICD-10-CM | POA: Diagnosis not present

## 2022-04-14 DIAGNOSIS — Z8673 Personal history of transient ischemic attack (TIA), and cerebral infarction without residual deficits: Secondary | ICD-10-CM | POA: Diagnosis not present

## 2022-04-25 ENCOUNTER — Other Ambulatory Visit (HOSPITAL_COMMUNITY): Payer: Self-pay

## 2022-04-25 MED ORDER — IRBESARTAN 300 MG PO TABS
300.0000 mg | ORAL_TABLET | Freq: Every day | ORAL | 3 refills | Status: DC
  Filled 2022-04-25: qty 90, 90d supply, fill #0
  Filled 2022-07-19 – 2022-09-09 (×3): qty 90, 90d supply, fill #1
  Filled 2023-01-10: qty 90, 90d supply, fill #2

## 2022-04-25 MED ORDER — TAMSULOSIN HCL 0.4 MG PO CAPS
0.4000 mg | ORAL_CAPSULE | Freq: Every day | ORAL | 3 refills | Status: DC
  Filled 2022-04-25: qty 90, 90d supply, fill #0
  Filled 2022-07-19 – 2022-09-09 (×2): qty 90, 90d supply, fill #1

## 2022-04-25 MED ORDER — AMLODIPINE BESYLATE 5 MG PO TABS
5.0000 mg | ORAL_TABLET | Freq: Every day | ORAL | 3 refills | Status: DC
  Filled 2022-04-25: qty 90, 90d supply, fill #0
  Filled 2022-07-19 – 2022-09-09 (×2): qty 90, 90d supply, fill #1

## 2022-04-25 MED ORDER — ASPIRIN 81 MG PO TBEC
81.0000 mg | DELAYED_RELEASE_TABLET | Freq: Every day | ORAL | 3 refills | Status: DC
  Filled 2022-04-25 – 2022-09-09 (×2): qty 90, 90d supply, fill #0

## 2022-04-25 MED ORDER — ROSUVASTATIN CALCIUM 20 MG PO TABS
20.0000 mg | ORAL_TABLET | Freq: Every day | ORAL | 3 refills | Status: DC
  Filled 2022-04-25: qty 90, 90d supply, fill #0
  Filled 2022-07-19 – 2022-09-09 (×2): qty 90, 90d supply, fill #1

## 2022-04-25 MED ORDER — FENOFIBRATE 54 MG PO TABS
54.0000 mg | ORAL_TABLET | Freq: Every day | ORAL | 3 refills | Status: DC
  Filled 2022-04-25 – 2022-09-09 (×3): qty 90, 90d supply, fill #0

## 2022-04-26 ENCOUNTER — Other Ambulatory Visit (HOSPITAL_COMMUNITY): Payer: Self-pay

## 2022-05-01 ENCOUNTER — Encounter: Payer: 59 | Admitting: Physical Medicine & Rehabilitation

## 2022-05-16 ENCOUNTER — Other Ambulatory Visit (HOSPITAL_COMMUNITY): Payer: Self-pay

## 2022-05-16 MED ORDER — FENOFIBRATE 54 MG PO TABS
54.0000 mg | ORAL_TABLET | Freq: Every day | ORAL | 3 refills | Status: DC
  Filled 2022-05-16: qty 90, 90d supply, fill #0

## 2022-05-29 ENCOUNTER — Telehealth: Payer: Self-pay | Admitting: Neurology

## 2022-05-29 NOTE — Telephone Encounter (Signed)
Sent mychart message

## 2022-06-04 NOTE — Telephone Encounter (Signed)
Left a voicemail for patient to call back to provider his new insurance information.

## 2022-07-19 ENCOUNTER — Other Ambulatory Visit (HOSPITAL_COMMUNITY): Payer: Self-pay

## 2022-07-21 ENCOUNTER — Encounter (HOSPITAL_COMMUNITY): Payer: Self-pay

## 2022-07-21 ENCOUNTER — Other Ambulatory Visit: Payer: Self-pay

## 2022-07-21 ENCOUNTER — Other Ambulatory Visit (HOSPITAL_COMMUNITY): Payer: Self-pay

## 2022-07-25 ENCOUNTER — Other Ambulatory Visit: Payer: Self-pay

## 2022-08-05 DIAGNOSIS — R5383 Other fatigue: Secondary | ICD-10-CM | POA: Diagnosis not present

## 2022-08-05 DIAGNOSIS — I129 Hypertensive chronic kidney disease with stage 1 through stage 4 chronic kidney disease, or unspecified chronic kidney disease: Secondary | ICD-10-CM | POA: Diagnosis not present

## 2022-08-05 DIAGNOSIS — E785 Hyperlipidemia, unspecified: Secondary | ICD-10-CM | POA: Diagnosis not present

## 2022-08-05 DIAGNOSIS — Z125 Encounter for screening for malignant neoplasm of prostate: Secondary | ICD-10-CM | POA: Diagnosis not present

## 2022-08-06 DIAGNOSIS — I129 Hypertensive chronic kidney disease with stage 1 through stage 4 chronic kidney disease, or unspecified chronic kidney disease: Secondary | ICD-10-CM | POA: Diagnosis not present

## 2022-08-06 DIAGNOSIS — Z0001 Encounter for general adult medical examination with abnormal findings: Secondary | ICD-10-CM | POA: Diagnosis not present

## 2022-08-06 DIAGNOSIS — Z125 Encounter for screening for malignant neoplasm of prostate: Secondary | ICD-10-CM | POA: Diagnosis not present

## 2022-08-06 DIAGNOSIS — Z8673 Personal history of transient ischemic attack (TIA), and cerebral infarction without residual deficits: Secondary | ICD-10-CM | POA: Diagnosis not present

## 2022-08-06 DIAGNOSIS — E785 Hyperlipidemia, unspecified: Secondary | ICD-10-CM | POA: Diagnosis not present

## 2022-08-06 DIAGNOSIS — N401 Enlarged prostate with lower urinary tract symptoms: Secondary | ICD-10-CM | POA: Diagnosis not present

## 2022-08-06 DIAGNOSIS — R5383 Other fatigue: Secondary | ICD-10-CM | POA: Diagnosis not present

## 2022-08-06 DIAGNOSIS — Z1211 Encounter for screening for malignant neoplasm of colon: Secondary | ICD-10-CM | POA: Diagnosis not present

## 2022-09-09 ENCOUNTER — Other Ambulatory Visit (HOSPITAL_COMMUNITY): Payer: Self-pay

## 2022-09-12 ENCOUNTER — Other Ambulatory Visit: Payer: Self-pay

## 2022-10-11 DIAGNOSIS — Z1212 Encounter for screening for malignant neoplasm of rectum: Secondary | ICD-10-CM | POA: Diagnosis not present

## 2022-10-11 DIAGNOSIS — Z1211 Encounter for screening for malignant neoplasm of colon: Secondary | ICD-10-CM | POA: Diagnosis not present

## 2022-10-17 LAB — COLOGUARD: COLOGUARD: NEGATIVE

## 2022-11-06 DIAGNOSIS — E785 Hyperlipidemia, unspecified: Secondary | ICD-10-CM | POA: Diagnosis not present

## 2022-11-06 DIAGNOSIS — I129 Hypertensive chronic kidney disease with stage 1 through stage 4 chronic kidney disease, or unspecified chronic kidney disease: Secondary | ICD-10-CM | POA: Diagnosis not present

## 2022-11-07 ENCOUNTER — Other Ambulatory Visit (HOSPITAL_COMMUNITY): Payer: Self-pay

## 2022-11-07 MED ORDER — NEXLETOL 180 MG PO TABS
180.0000 mg | ORAL_TABLET | Freq: Every day | ORAL | 3 refills | Status: DC
  Filled 2022-11-07: qty 90, 90d supply, fill #0

## 2022-11-07 MED ORDER — MELOXICAM 7.5 MG PO TABS
7.5000 mg | ORAL_TABLET | Freq: Every day | ORAL | 1 refills | Status: DC | PRN
  Filled 2022-11-07: qty 180, 90d supply, fill #0

## 2022-11-10 ENCOUNTER — Other Ambulatory Visit (HOSPITAL_COMMUNITY): Payer: Self-pay

## 2022-11-14 ENCOUNTER — Other Ambulatory Visit (HOSPITAL_COMMUNITY): Payer: Self-pay

## 2022-11-15 ENCOUNTER — Other Ambulatory Visit (HOSPITAL_COMMUNITY): Payer: Self-pay

## 2022-12-18 ENCOUNTER — Other Ambulatory Visit (HOSPITAL_COMMUNITY): Payer: Self-pay

## 2023-01-20 ENCOUNTER — Other Ambulatory Visit (HOSPITAL_COMMUNITY): Payer: Self-pay

## 2023-02-16 ENCOUNTER — Other Ambulatory Visit (HOSPITAL_COMMUNITY): Payer: Self-pay

## 2023-02-16 MED ORDER — AMLODIPINE BESYLATE 5 MG PO TABS
5.0000 mg | ORAL_TABLET | Freq: Every day | ORAL | 3 refills | Status: DC
  Filled 2023-02-16: qty 90, 90d supply, fill #0

## 2023-02-16 MED ORDER — ASPIRIN 81 MG PO TBEC
81.0000 mg | DELAYED_RELEASE_TABLET | Freq: Every day | ORAL | 3 refills | Status: AC
  Filled 2023-02-16: qty 90, 90d supply, fill #0

## 2023-02-16 MED ORDER — MELOXICAM 7.5 MG PO TABS
7.5000 mg | ORAL_TABLET | Freq: Every day | ORAL | 1 refills | Status: AC | PRN
  Filled 2023-02-16: qty 180, 90d supply, fill #0

## 2023-02-16 MED ORDER — IRBESARTAN 300 MG PO TABS
300.0000 mg | ORAL_TABLET | Freq: Every day | ORAL | 3 refills | Status: DC
  Filled 2023-02-16: qty 90, 90d supply, fill #0

## 2023-02-16 MED ORDER — FENOFIBRATE 54 MG PO TABS
54.0000 mg | ORAL_TABLET | Freq: Every day | ORAL | 3 refills | Status: DC
  Filled 2023-02-16: qty 90, 90d supply, fill #0

## 2023-02-16 MED ORDER — NEXLETOL 180 MG PO TABS
180.0000 mg | ORAL_TABLET | Freq: Every day | ORAL | 3 refills | Status: DC
  Filled 2023-02-16: qty 90, 90d supply, fill #0

## 2023-02-16 MED ORDER — TAMSULOSIN HCL 0.4 MG PO CAPS
0.4000 mg | ORAL_CAPSULE | Freq: Every day | ORAL | 3 refills | Status: DC
  Filled 2023-02-16: qty 90, 90d supply, fill #0

## 2023-02-17 ENCOUNTER — Other Ambulatory Visit: Payer: Self-pay

## 2023-02-17 ENCOUNTER — Other Ambulatory Visit (HOSPITAL_COMMUNITY): Payer: Self-pay

## 2023-08-06 DIAGNOSIS — E785 Hyperlipidemia, unspecified: Secondary | ICD-10-CM | POA: Diagnosis not present

## 2023-08-06 DIAGNOSIS — I129 Hypertensive chronic kidney disease with stage 1 through stage 4 chronic kidney disease, or unspecified chronic kidney disease: Secondary | ICD-10-CM | POA: Diagnosis not present

## 2023-08-06 DIAGNOSIS — Z125 Encounter for screening for malignant neoplasm of prostate: Secondary | ICD-10-CM | POA: Diagnosis not present

## 2023-08-06 DIAGNOSIS — R5383 Other fatigue: Secondary | ICD-10-CM | POA: Diagnosis not present

## 2023-10-26 ENCOUNTER — Other Ambulatory Visit (HOSPITAL_COMMUNITY): Payer: Self-pay

## 2023-10-26 MED ORDER — AMLODIPINE BESYLATE 5 MG PO TABS
5.0000 mg | ORAL_TABLET | Freq: Every day | ORAL | 3 refills | Status: DC
  Filled 2023-10-26 – 2023-11-03 (×3): qty 90, 90d supply, fill #0
  Filled 2024-02-23 (×2): qty 90, 90d supply, fill #1

## 2023-10-26 MED ORDER — IRBESARTAN 300 MG PO TABS
300.0000 mg | ORAL_TABLET | Freq: Every day | ORAL | 3 refills | Status: AC
  Filled 2023-10-26 – 2023-11-03 (×3): qty 90, 90d supply, fill #0
  Filled 2024-02-23 (×2): qty 90, 90d supply, fill #1

## 2023-10-26 MED ORDER — NEXLETOL 180 MG PO TABS
180.0000 mg | ORAL_TABLET | Freq: Every day | ORAL | 3 refills | Status: DC
  Filled 2023-10-26: qty 90, 90d supply, fill #0

## 2023-10-26 MED ORDER — FENOFIBRATE 54 MG PO TABS
54.0000 mg | ORAL_TABLET | Freq: Every day | ORAL | 3 refills | Status: DC
  Filled 2023-10-26 – 2023-11-03 (×3): qty 90, 90d supply, fill #0

## 2023-10-26 MED ORDER — TAMSULOSIN HCL 0.4 MG PO CAPS
0.4000 mg | ORAL_CAPSULE | Freq: Every day | ORAL | 3 refills | Status: AC
  Filled 2023-10-26 – 2023-11-03 (×3): qty 90, 90d supply, fill #0
  Filled 2024-02-23 (×2): qty 90, 90d supply, fill #1

## 2023-10-27 ENCOUNTER — Other Ambulatory Visit: Payer: Self-pay

## 2023-11-03 ENCOUNTER — Other Ambulatory Visit (HOSPITAL_COMMUNITY): Payer: Self-pay

## 2023-11-03 ENCOUNTER — Other Ambulatory Visit: Payer: Self-pay

## 2024-01-22 ENCOUNTER — Other Ambulatory Visit (HOSPITAL_COMMUNITY): Payer: Self-pay

## 2024-01-22 ENCOUNTER — Other Ambulatory Visit: Payer: Self-pay

## 2024-01-22 ENCOUNTER — Encounter: Payer: Self-pay | Admitting: Cardiology

## 2024-01-22 ENCOUNTER — Ambulatory Visit: Attending: Cardiology | Admitting: Cardiology

## 2024-01-22 VITALS — BP 178/86 | HR 87

## 2024-01-22 DIAGNOSIS — R0609 Other forms of dyspnea: Secondary | ICD-10-CM

## 2024-01-22 DIAGNOSIS — I10 Essential (primary) hypertension: Secondary | ICD-10-CM | POA: Diagnosis not present

## 2024-01-22 DIAGNOSIS — R072 Precordial pain: Secondary | ICD-10-CM | POA: Diagnosis not present

## 2024-01-22 DIAGNOSIS — I639 Cerebral infarction, unspecified: Secondary | ICD-10-CM

## 2024-01-22 DIAGNOSIS — E7849 Other hyperlipidemia: Secondary | ICD-10-CM

## 2024-01-22 MED ORDER — FENOFIBRATE 54 MG PO TABS
54.0000 mg | ORAL_TABLET | Freq: Every day | ORAL | 3 refills | Status: AC
  Filled 2024-01-22 – 2024-02-23 (×3): qty 90, 90d supply, fill #0

## 2024-01-22 MED ORDER — EPLERENONE 25 MG PO TABS
25.0000 mg | ORAL_TABLET | Freq: Every day | ORAL | 3 refills | Status: DC
  Filled 2024-01-22 – 2024-02-23 (×3): qty 90, 90d supply, fill #0

## 2024-01-22 MED ORDER — METOPROLOL TARTRATE 100 MG PO TABS
ORAL_TABLET | ORAL | 0 refills | Status: AC
Start: 2024-01-22 — End: ?
  Filled 2024-01-22: qty 1, 1d supply, fill #0

## 2024-01-22 MED ORDER — MOUNJARO 5 MG/0.5ML ~~LOC~~ SOAJ
5.0000 mg | SUBCUTANEOUS | 3 refills | Status: DC
  Filled 2024-01-22: qty 2, 28d supply, fill #0

## 2024-01-22 NOTE — Progress Notes (Addendum)
 Cardiology Office Note:    Date:  01/22/2024   ID:  Charles Farley, DOB 1970/09/25, MRN [id number]  PCP:  Meredeth Prentice KIDD, MD  Cardiologist:  Redell Leiter, MD   Referring MD: Meredeth Prentice KIDD, MD  ASSESSMENT:    1. Precordial pain   2. Dyspnea on exertion   3. Primary hypertension   4. Other hyperlipidemia   5. Subcortical infarction (HCC)   6. Morbid obesity (HCC)    PLAN:    In order of problems listed above:  Further evaluation cardiac CTA Previous echocardiogram normal function check proBNP level unless elevated I would repeat at this time Not optimally controlled and MRA continue his ARB calcium  channel blocker Screen for primary hyperaldosteronism Continue nonstatin lipid-lowering treatment GLP-1 agonist therapy indicated initiated [place] a very high risk for obstructive sleep apnea with obesity and a high STOP-BANG score he requires evaluation Home sleep study ordered.  Next appointment 3 months for   Medication Adjustments/Labs and Tests Ordered: Current medicines are reviewed at length with the patient today.  Concerns regarding medicines are outlined above.  Orders Placed This Encounter  Procedures   CT CORONARY MORPH W/CTA COR W/SCORE W/CA W/CM &/OR WO/CM   Basic metabolic panel with GFR   Pro b natriuretic peptide (BNP)   Aldosterone + renin activity w/ ratio   Amb Ref to Medical Weight Management   EKG 12-Lead   Meds ordered this encounter  Medications   tirzepatide  (MOUNJARO ) 5 MG/0.5ML Pen    Sig: Inject 5 mg into the skin once a week.    Dispense:  6 mL    Refill:  3   eplerenone  (INSPRA ) 25 MG tablet    Sig: Take 1 tablet (25 mg total) by mouth daily.    Dispense:  90 tablet    Refill:  3   fenofibrate  54 MG tablet    Sig: Take 1 tablet (54 mg total) by mouth daily.    Dispense:  90 tablet    Refill:  3   metoprolol  tartrate (LOPRESSOR ) 100 MG tablet    Sig: Take one tablet 2 hours before cardiac CT for heart greater than 55    Dispense:  1  tablet    Refill:  0       History of Present Illness:    Charles Farley is a 53 y.o. male who is being seen today for the evaluation of chest pain at the request of Chow, Prentice KIDD, MD. An echocardiogram performed October 2023 left ventricle showed mild LVH normal systolic function and grade 1.  Relaxation likely appropriate for age right ventricle was normal and there is mild aortic regurgitation.  CTA head neck October 2023 normal no atherosclerotic stenosis  Well-known to me professionally history of hypertension hyperlipidemia and previous stroke Previous evaluation [place] myocardial perfusion study normal Continues to have episodes of chest discomfort which occur without physical activity and resolves spontaneously.  Also intermittent dyspnea. Blood pressure has not been well-controlled on multiple agents Previously intolerant of semaglutide with severe constipation We both realize that weight loss is important nutritional consultation and trial Mounjaro   Past Medical History:  Diagnosis Date   ADHD    HLD (hyperlipidemia)    Hypertension    Stroke [facility])     Past Surgical History:  Procedure Laterality Date   TONSILECTOMY/ADENOIDECTOMY WITH MYRINGOTOMY      Current Medications: Current Meds  Medication Sig   amLODipine  (NORVASC ) 5 MG tablet Take 1 tablet (5 mg total) by  mouth daily.   aspirin  EC (SM ASPIRIN  LOW DOSE) 81 MG tablet Take 1 tablet (81 mg total) by mouth daily.   aspirin  EC 81 MG tablet Take 1 tablet (81 mg total) by mouth daily.   Bempedoic Acid  (NEXLETOL ) 180 MG TABS Take 1 tablet (180 mg total) by mouth daily.   eplerenone  (INSPRA ) 25 MG tablet Take 1 tablet (25 mg total) by mouth daily.   irbesartan  (AVAPRO ) 300 MG tablet Take 1 tablet (300 mg total) by mouth daily.   meloxicam  (MOBIC ) 7.5 MG tablet Take 1-2 tablets (7.5-15 mg total) by mouth daily as needed.   metoprolol  tartrate (LOPRESSOR ) 100 MG tablet Take one tablet 2 hours before cardiac CT for  heart greater than 55   tamsulosin  (FLOMAX ) 0.4 MG CAPS capsule Take 1 capsule (0.4 mg total) by mouth daily.   tirzepatide  (MOUNJARO ) 5 MG/0.5ML Pen Inject 5 mg into the skin once a week.   [DISCONTINUED] fenofibrate  54 MG tablet Take 1 tablet (54 mg total) by mouth daily.     Allergies:   Patient has no known allergies.   Social History   Socioeconomic History   Marital status: Married    Spouse name: Not on file   Number of children: Not on file   Years of education: Not on file   Highest education level: Not on file  Occupational History   Not on file  Tobacco Use   Smoking status: Never   Smokeless tobacco: Never  Vaping Use   Vaping status: Never Used  Substance and Sexual Activity   Alcohol use: Not Currently   Drug use: Never   Sexual activity: Not on file  Other Topics Concern   Not on file  Social History Narrative   Not on file   Social Drivers of Health   Financial Resource Strain: Not on file  Food Insecurity: Not on file  Transportation Needs: Not on file  Physical Activity: Not on file  Stress: Not on file  Social Connections: Not on file     Family History: The patient's family history includes Aneurysm in his mother; Heart disease in his father.  ROS:   ROS Please see the history of present illness.     All other systems reviewed and are negative.  EKGs/Labs/Other Studies Reviewed:    The following studies were reviewed today:   Cardiac Studies & Procedures   ______________________________________________________________________________________________     ECHOCARDIOGRAM  ECHOCARDIOGRAM COMPLETE 03/03/2022  Narrative ECHOCARDIOGRAM REPORT    Patient Name:   Charles Farley Date of Exam: 03/03/2022 Medical Rec #:  [id number]     Height:       72.0 in Accession #:    [id number]    Weight:       315.0 lb Date of Birth:  [birth year]     BSA:          2.584 m Patient Age:    51 years      BP:           176/89 mmHg Patient Gender: M              HR:           69 bpm. Exam Location:  Inpatient  Procedure: 2D Echo, Cardiac Doppler, Color Doppler and Intracardiac Opacification Agent  Indications:    Stroke I63.9  History:        Patient has no prior history of Echocardiogram examinations. Risk Factors:Hypertension, Dyslipidemia and Non-Smoker.  Sonographer:  Madeline Finder Referring Phys: [phone] JARED M GARDNER   Sonographer Comments: Technically difficult study due to poor echo windows, no subcostal window and patient is obese. Image acquisition challenging due to respiratory motion. IMPRESSIONS   1. Left ventricular ejection fraction, by estimation, is 60 to 65%. The left ventricle has normal function. The left ventricle has no regional wall motion abnormalities. There is mild concentric left ventricular hypertrophy. Left ventricular diastolic parameters are consistent with Grade I diastolic dysfunction (impaired relaxation). 2. Right ventricular systolic function is normal. The right ventricular size is normal. Tricuspid regurgitation signal is inadequate for assessing PA pressure. 3. The mitral valve is normal in structure. Trivial mitral valve regurgitation. No evidence of mitral stenosis. 4. The aortic valve is tricuspid. Aortic valve regurgitation is mild. No aortic stenosis is present. 5. The inferior vena cava is normal in size with greater than 50% respiratory variability, suggesting right atrial pressure of 3 mmHg.  Comparison(s): No prior Echocardiogram.  Conclusion(s)/Recommendation(s): No intracardiac source of embolism detected on this transthoracic study. Consider a transesophageal echocardiogram to exclude cardiac source of embolism if clinically indicated.  FINDINGS Left Ventricle: Left ventricular ejection fraction, by estimation, is 60 to 65%. The left ventricle has normal function. The left ventricle has no regional wall motion abnormalities. Definity  contrast agent was given IV to delineate the left  ventricular endocardial borders. The left ventricular internal cavity size was normal in size. There is mild concentric left ventricular hypertrophy. Left ventricular diastolic parameters are consistent with Grade I diastolic dysfunction (impaired relaxation).  Right Ventricle: The right ventricular size is normal. No increase in right ventricular wall thickness. Right ventricular systolic function is normal. Tricuspid regurgitation signal is inadequate for assessing PA pressure.  Left Atrium: Left atrial size was normal in size.  Right Atrium: Right atrial size was normal in size.  Pericardium: There is no evidence of pericardial effusion.  Mitral Valve: The mitral valve is normal in structure. Trivial mitral valve regurgitation. No evidence of mitral valve stenosis.  Tricuspid Valve: The tricuspid valve is normal in structure. Tricuspid valve regurgitation is trivial.  Aortic Valve: The aortic valve is tricuspid. Aortic valve regurgitation is mild. No aortic stenosis is present.  Pulmonic Valve: The pulmonic valve was normal in structure. Pulmonic valve regurgitation is trivial.  Aorta: The aortic root and ascending aorta are structurally normal, with no evidence of dilitation.  Venous: The inferior vena cava is normal in size with greater than 50% respiratory variability, suggesting right atrial pressure of 3 mmHg.  IAS/Shunts: The atrial septum is grossly normal.   LEFT VENTRICLE PLAX 2D LVIDd:         5.10 cm   Diastology LVIDs:         4.00 cm   LV e' medial:    5.26 cm/s LV PW:         1.00 cm   LV E/e' medial:  13.5 LV IVS:        1.00 cm   LV e' lateral:   9.90 cm/s LVOT diam:     2.20 cm   LV E/e' lateral: 7.2 LV SV:         81 LV SV Index:   31 LVOT Area:     3.80 cm   RIGHT VENTRICLE RV S prime:     19.60 cm/s TAPSE (M-mode): 2.6 cm  LEFT ATRIUM             Index        RIGHT ATRIUM  Index LA diam:        3.80 cm 1.47 cm/m   RA Area:     17.20 cm LA  Vol (A2C):   42.9 ml 16.60 ml/m  RA Volume:   44.60 ml  17.26 ml/m LA Vol (A4C):   46.2 ml 17.88 ml/m LA Biplane Vol: 45.1 ml 17.45 ml/m AORTIC VALVE LVOT Vmax:   111.00 cm/s LVOT Vmean:  71.800 cm/s LVOT VTI:    0.212 m  AORTA Ao Root diam: 3.60 cm Ao Asc diam:  3.40 cm  MITRAL VALVE MV Area (PHT): 2.87 cm    SHUNTS MV Decel Time: 264 msec    Systemic VTI:  0.21 m MV E velocity: 71.00 cm/s  Systemic Diam: 2.20 cm MV A velocity: 97.80 cm/s MV E/A ratio:  0.73  Charles Sorrow MD Electronically signed by Charles Sorrow MD Signature Date/Time: 03/03/2022/11:44:33 AM    Final          ______________________________________________________________________________________________      EKG Interpretation Date/Time:  Friday January 22 2024 14:18:04 EDT Ventricular Rate:  87 PR Interval:  132 QRS Duration:  92 QT Interval:  356 QTC Calculation: 428 R Axis:   52  Text Interpretation: Normal sinus rhythm Nonspecific ST abnormality When compared with ECG of 01-Mar-2022 20:06, PREVIOUS ECG IS PRESENT Confirmed by Monetta Rogue ([id number]) on 01/22/2024 2:25:20 PM    Recent Labs: No results found for requested labs within last 365 days.  Recent Lipid Panel    Component Value Date/Time   CHOL 223 (H) 03/01/2022 2013   TRIG 232 (H) 03/01/2022 2013   HDL 35 (L) 03/01/2022 2013   CHOLHDL 6.4 03/01/2022 2013   VLDL 46 (H) 03/01/2022 2013   LDLCALC 142 (H) 03/01/2022 2013    Physical Exam:    VS:  BP (!) 178/86   Pulse 87   SpO2 97%     Wt Readings from Last 3 Encounters:  03/18/22 (!) 318 lb (144.2 kg)  03/18/22 (!) 316 lb (143.3 kg)  03/06/22 (!) 315 lb (142.9 kg)     GEN:  Well nourished, well developed in no acute distress HEENT: Normal NECK: No JVD; No carotid bruits LYMPHATICS: No lymphadenopathy CARDIAC: RRR, no murmurs, rubs, gallops RESPIRATORY:  Clear to auscultation without rales, wheezing or rhonchi  ABDOMEN: Soft, non-tender,  non-distended MUSCULOSKELETAL:  No edema; No deformity  SKIN: Warm and dry NEUROLOGIC:  Alert and oriented x 3 PSYCHIATRIC:  Normal affect     Signed, Rogue Monetta, MD  01/22/2024 5:18 PM    [facility]

## 2024-01-22 NOTE — Patient Instructions (Signed)
 Medication Instructions:   START: Mounjaro  5mg  SQ q week  START: Inspra  25mg  daily  TAKE: Metoprolol  100mg  1 tablet 2 hours prior to CT Scan   Lab Work: BMP, ProBNP, Aldosterone Level- today If you have labs (blood work) drawn today and your tests are completely normal, you will receive your results only by: MyChart Message (if you have MyChart) OR A paper copy in the mail If you have any lab test that is abnormal or we need to change your treatment, we will call you to review the results    Lab Work: None Ordered If you have labs (blood work) drawn today and your tests are completely normal, you will receive your results only by: MyChart Message (if you have MyChart) OR A paper copy in the mail If you have any lab test that is abnormal or we need to change your treatment, we will call you to review the results.   Testing/Procedures:  Your cardiac CT will be scheduled at one of the below locations:   [facility] [address] [place], [place] [address]  Please follow these instructions carefully (unless otherwise directed):  Hold all erectile dysfunction medications at least 3 days (72 hrs) prior to test.  On the Night Before the Test: Be sure to Drink plenty of water. Do not consume any caffeinated/decaffeinated beverages or chocolate 12 hours prior to your test. Do not take any antihistamines 12 hours prior to your test.  On the Day of the Test: Drink plenty of water until 1 hour prior to the test. Do not eat any food 4 hours prior to the test. You may take your regular medications prior to the test.  Take metoprolol  (Lopressor ) two hours prior to test. HOLD Furosemide/Hydrochlorothiazide  morning of the test. FEMALES- please wear underwire-free bra if available, avoid dresses & tight clothing       After the Test: Drink plenty of water. After receiving IV contrast, you may experience a mild flushed feeling. This is normal. On occasion, you may  experience a mild rash up to 24 hours after the test. This is not dangerous. If this occurs, you can take Benadryl 25 mg and increase your fluid intake. If you experience trouble breathing, this can be serious. If it is severe call 911 IMMEDIATELY. If it is mild, please call our office. If you take any of these medications: Glipizide/Metformin, Avandament, Glucavance, please do not take 48 hours after completing test unless otherwise instructed.  We will call to schedule your test 2-4 weeks out understanding that some insurance companies will need an authorization prior to the service being performed.   For non-scheduling related questions, please contact the cardiac imaging nurse navigator should you have any questions/concerns: Camie Shutter, Cardiac Imaging Nurse Navigator Chantal Requena, Cardiac Imaging Nurse Navigator [facility] Direct Office Dial: [phone]   For scheduling needs, including cancellations and rescheduling, please call [place], [phone].     Follow-Up: At [facility], you and your health needs are our priority.  As part of our continuing mission to provide you with exceptional heart care, we have created designated Provider Care Teams.  These Care Teams include your primary Cardiologist (physician) and Advanced Practice Providers (APPs -  Physician Assistants and Nurse Practitioners) who all work together to provide you with the care you need, when you need it.  We recommend signing up for the patient portal called MyChart.  Sign up information is provided on this After Visit Summary.  MyChart is used to connect with patients for Virtual Visits (Telemedicine).  Patients are able to view lab/test results, encounter notes, upcoming appointments, etc.  Non-urgent messages can be sent to your provider as well.   To learn more about what you can do with MyChart, go to [email or web address].    Your next appointment:   3 month(s)  The  format for your next appointment:   In Person  Provider:   Redell Leiter, MD   Other Instructions NA

## 2024-01-23 ENCOUNTER — Other Ambulatory Visit (HOSPITAL_COMMUNITY): Payer: Self-pay

## 2024-01-25 ENCOUNTER — Other Ambulatory Visit (HOSPITAL_COMMUNITY): Payer: Self-pay

## 2024-01-25 ENCOUNTER — Telehealth: Payer: Self-pay | Admitting: Pharmacy Technician

## 2024-01-25 ENCOUNTER — Telehealth (HOSPITAL_COMMUNITY): Payer: Self-pay

## 2024-01-25 NOTE — Telephone Encounter (Signed)
 Ozempic Brena is approved exclusively as an adjunct to diet and exercise to improve glycemic control in adults with type 2 diabetes mellitus. A review of patient's medical chart reveals no documented diagnosis of type 2 diabetes or an A1C indicative of diabetes. Therefore, they do not currently meet the criteria for prior authorization of this medication. If clinically appropriate, alternative  options such as Saxenda, Zepbound, or Wegovy  may be considered for this patient.

## 2024-01-29 LAB — ALDOSTERONE + RENIN ACTIVITY W/ RATIO
Aldos/Renin Ratio: 3.5 (ref 0.0–30.0)
Aldosterone: 6.6 ng/dL (ref 0.0–30.0)
Renin Activity, Plasma: 1.86 ng/mL/h (ref 0.167–5.380)

## 2024-01-29 LAB — BASIC METABOLIC PANEL WITH GFR
BUN/Creatinine Ratio: 10 (ref 9–20)
BUN: 11 mg/dL (ref 6–24)
CO2: 25 mmol/L (ref 20–29)
Calcium: 8.9 mg/dL (ref 8.7–10.2)
Chloride: 101 mmol/L (ref 96–106)
Creatinine, Ser: 1.15 mg/dL (ref 0.76–1.27)
Glucose: 77 mg/dL (ref 70–99)
Potassium: 3.8 mmol/L (ref 3.5–5.2)
Sodium: 141 mmol/L (ref 134–144)
eGFR: 77 mL/min/1.73 (ref >59)

## 2024-01-29 LAB — PRO B NATRIURETIC PEPTIDE: NT-Pro BNP: 88 pg/mL (ref 0–121)

## 2024-01-31 ENCOUNTER — Encounter: Payer: Self-pay | Admitting: Cardiology

## 2024-02-01 ENCOUNTER — Other Ambulatory Visit (HOSPITAL_COMMUNITY): Payer: Self-pay

## 2024-02-03 ENCOUNTER — Encounter (INDEPENDENT_AMBULATORY_CARE_PROVIDER_SITE_OTHER): Payer: Self-pay

## 2024-02-18 ENCOUNTER — Inpatient Hospital Stay (INDEPENDENT_AMBULATORY_CARE_PROVIDER_SITE_OTHER)
Admission: RE | Admit: 2024-02-18 | Discharge: 2024-02-18 | Disposition: A | Discharge status: Home / Self Care | Payer: Self-pay | Source: Ambulatory Visit | Attending: Cardiology | Admitting: Cardiology

## 2024-02-18 ENCOUNTER — Ambulatory Visit: Payer: Self-pay | Admitting: Cardiology

## 2024-02-18 ENCOUNTER — Telehealth: Payer: Self-pay

## 2024-02-18 ENCOUNTER — Other Ambulatory Visit: Payer: Self-pay | Admitting: Cardiology

## 2024-02-18 ENCOUNTER — Ambulatory Visit (HOSPITAL_BASED_OUTPATIENT_CLINIC_OR_DEPARTMENT_OTHER)
Admission: RE | Admit: 2024-02-18 | Discharge: 2024-02-18 | Disposition: A | Source: Ambulatory Visit | Attending: Cardiology | Admitting: Cardiology

## 2024-02-18 ENCOUNTER — Other Ambulatory Visit (HOSPITAL_COMMUNITY): Payer: Self-pay

## 2024-02-18 DIAGNOSIS — R072 Precordial pain: Secondary | ICD-10-CM | POA: Diagnosis not present

## 2024-02-18 DIAGNOSIS — I2510 Atherosclerotic heart disease of native coronary artery without angina pectoris: Secondary | ICD-10-CM | POA: Diagnosis not present

## 2024-02-18 DIAGNOSIS — R931 Abnormal findings on diagnostic imaging of heart and coronary circulation: Secondary | ICD-10-CM

## 2024-02-18 MED ORDER — SEMAGLUTIDE-WEIGHT MANAGEMENT 0.5 MG/0.5ML ~~LOC~~ SOAJ
0.5000 mg | SUBCUTANEOUS | 0 refills | Status: AC
  Filled 2024-02-18: qty 2, 28d supply, fill #0

## 2024-02-18 MED ORDER — SEMAGLUTIDE-WEIGHT MANAGEMENT 1.7 MG/0.75ML ~~LOC~~ SOAJ
1.7000 mg | SUBCUTANEOUS | 0 refills | Status: AC
  Filled 2024-02-18: qty 3, 28d supply, fill #0

## 2024-02-18 MED ORDER — SEMAGLUTIDE-WEIGHT MANAGEMENT 1 MG/0.5ML ~~LOC~~ SOAJ
1.0000 mg | SUBCUTANEOUS | 0 refills | Status: AC
  Filled 2024-02-18: qty 2, 28d supply, fill #0

## 2024-02-18 MED ORDER — SEMAGLUTIDE-WEIGHT MANAGEMENT 0.25 MG/0.5ML ~~LOC~~ SOAJ
0.2500 mg | SUBCUTANEOUS | 0 refills | Status: AC
  Filled 2024-02-18 – 2024-02-23 (×2): qty 2, 28d supply, fill #0

## 2024-02-18 MED ORDER — IOHEXOL 350 MG/ML SOLN
125.0000 mL | Freq: Once | INTRAVENOUS | Status: AC | PRN
  Administered 2024-02-18: 115 mL via INTRAVENOUS

## 2024-02-18 MED ORDER — NITROGLYCERIN 0.4 MG SL SUBL
0.8000 mg | SUBLINGUAL_TABLET | Freq: Once | SUBLINGUAL | Status: AC
  Administered 2024-02-18: 0.8 mg via SUBLINGUAL

## 2024-02-18 MED ORDER — NITROGLYCERIN 0.4 MG SL SUBL
0.4000 mg | SUBLINGUAL_TABLET | SUBLINGUAL | 6 refills | Status: AC | PRN
  Filled 2024-02-18: qty 25, 25d supply, fill #0
  Filled 2024-02-23 (×2): qty 25, 1d supply, fill #0

## 2024-02-18 MED ORDER — SEMAGLUTIDE-WEIGHT MANAGEMENT 2.4 MG/0.75ML ~~LOC~~ SOAJ
2.4000 mg | SUBCUTANEOUS | 0 refills | Status: AC
  Filled 2024-02-18: qty 3, 28d supply, fill #0

## 2024-02-18 NOTE — Progress Notes (Signed)
 FFR Order

## 2024-02-18 NOTE — Telephone Encounter (Signed)
 Nitroglycerin #25 to use every 5 min PRN chest pain ref x 6, Semaglutide weekly

## 2024-02-19 ENCOUNTER — Other Ambulatory Visit (HOSPITAL_COMMUNITY): Payer: Self-pay

## 2024-02-23 ENCOUNTER — Other Ambulatory Visit (HOSPITAL_COMMUNITY): Payer: Self-pay

## 2024-02-23 ENCOUNTER — Other Ambulatory Visit: Payer: Self-pay

## 2024-03-01 ENCOUNTER — Other Ambulatory Visit (HOSPITAL_COMMUNITY): Payer: Self-pay

## 2024-03-01 MED ORDER — AMLODIPINE BESYLATE 10 MG PO TABS
10.0000 mg | ORAL_TABLET | Freq: Every day | ORAL | 3 refills | Status: AC
  Filled 2024-03-01: qty 90, 90d supply, fill #0

## 2024-03-02 ENCOUNTER — Other Ambulatory Visit: Payer: Self-pay

## 2024-03-02 ENCOUNTER — Other Ambulatory Visit: Payer: Self-pay | Admitting: *Deleted

## 2024-03-02 DIAGNOSIS — R0683 Snoring: Secondary | ICD-10-CM

## 2024-03-03 ENCOUNTER — Other Ambulatory Visit: Payer: Self-pay

## 2024-03-03 ENCOUNTER — Other Ambulatory Visit (HOSPITAL_COMMUNITY): Payer: Self-pay

## 2024-03-11 DIAGNOSIS — I129 Hypertensive chronic kidney disease with stage 1 through stage 4 chronic kidney disease, or unspecified chronic kidney disease: Secondary | ICD-10-CM | POA: Diagnosis not present

## 2024-03-11 DIAGNOSIS — E785 Hyperlipidemia, unspecified: Secondary | ICD-10-CM | POA: Diagnosis not present

## 2024-03-14 ENCOUNTER — Other Ambulatory Visit: Payer: Self-pay | Admitting: Cardiology

## 2024-03-14 DIAGNOSIS — I639 Cerebral infarction, unspecified: Secondary | ICD-10-CM

## 2024-05-03 ENCOUNTER — Other Ambulatory Visit: Payer: Self-pay

## 2024-05-03 DIAGNOSIS — I129 Hypertensive chronic kidney disease with stage 1 through stage 4 chronic kidney disease, or unspecified chronic kidney disease: Secondary | ICD-10-CM | POA: Insufficient documentation

## 2024-05-03 DIAGNOSIS — I2510 Atherosclerotic heart disease of native coronary artery without angina pectoris: Secondary | ICD-10-CM | POA: Insufficient documentation

## 2024-05-03 DIAGNOSIS — Z8673 Personal history of transient ischemic attack (TIA), and cerebral infarction without residual deficits: Secondary | ICD-10-CM | POA: Insufficient documentation

## 2024-05-03 DIAGNOSIS — N401 Enlarged prostate with lower urinary tract symptoms: Secondary | ICD-10-CM | POA: Insufficient documentation

## 2024-05-03 NOTE — H&P (View-Only) (Signed)
 " Cardiology Office Note:    Date:  05/04/2024   ID:  Charles Farley, DOB 05-25-1970, MRN [id number]  PCP:  Meredeth Prentice KIDD, MD  Cardiologist:  Redell Leiter, MD    Referring MD: Meredeth Prentice KIDD, MD    ASSESSMENT:    1. Coronary artery disease of native artery of native heart with stable angina pectoris   2. Primary hypertension   3. Mixed hyperlipidemia   4. Morbid obesity (HCC)    PLAN:    In order of problems listed above:  See discussion below continue medical therapy aspirin  beta-blocker calcium  channel blocker nonstatin lipid-lowering treatment add oral Imdur  refer to coronary angiography and probable intervention Improved increase his MRA dosage and stressed need for compliance weight loss Check labs today including LP(a) April be   Next appointment: 3 months   Medication Adjustments/Labs and Tests Ordered: Current medicines are reviewed at length with the patient today.  Concerns regarding medicines are outlined above.  No orders of the defined types were placed in this encounter.  No orders of the defined types were placed in this encounter.    History of Present Illness:    Charles Farley is a 53 y.o. male with a hx of hypertension hyperlipidemia and previous stroke last seen 01/22/24.  He had cardiac CTA 02/18/2024 with a calcium  score of 27 50th percentile and stenosis felt to be severe in the proximal right coronary artery with diminished FFR.  With stable chest pain pattern CAD is initiated on medical therapy including aspirin  continue beta-blocker amlodipine  and his ARB for hypertensive control and advise GLP-1 agent.  Statin intolerant and takes bempedoic acid  and fenofibrate .  Compliance with diet, lifestyle and medications: Yes but not infrequently forgets to take medications perhaps 50% of the time  Home blood pressures running 140-160 less than 90 No muscle symptoms from lipid-lowering treatment Despite aspirin  beta-blocker calcium  channel blocker and  lipid-lowering therapy having frequent episodes of angina at rest associated with stress typical substernal into the jaw relieved with rest has not needed nitroglycerin  Will check baseline labs today for liver function increase MRA add oral Imdur  and refer for elective coronary angiography and probable PCI for severe right coronary stenosis Past Medical History:  Diagnosis Date   ADHD    Arteriosclerotic cardiovascular disease (ASCVD) 05/03/2024   Chronic kidney disease due to hypertension 05/03/2024   History of stroke without residual deficits 05/03/2024   HLD (hyperlipidemia)    Hypertension    Lower urinary tract symptoms due to benign prostatic hyperplasia 05/03/2024   Passive suicidal ideations 02/12/2020   Persistent adjustment disorder 02/12/2020   Stroke (HCC)    Subcortical infarction (HCC) 03/03/2022    Current Medications: Active Medications[1]    EKGs/Labs/Other Studies Reviewed:    The following studies were reviewed today:  Cardiac Studies & Procedures   ______________________________________________________________________________________________     ECHOCARDIOGRAM  ECHOCARDIOGRAM COMPLETE 03/03/2022  Narrative ECHOCARDIOGRAM REPORT    Patient Name:   Charles Farley Date of Exam: 03/03/2022 Medical Rec #:  [id number]     Height:       72.0 in Accession #:    [id number]    Weight:       315.0 lb Date of Birth:  [birth year]     BSA:          2.584 m Patient Age:    51 years      BP:           176/89 mmHg Patient Gender:  M             HR:           69 bpm. Exam Location:  Inpatient  Procedure: 2D Echo, Cardiac Doppler, Color Doppler and Intracardiac Opacification Agent  Indications:    Stroke I63.9  History:        Patient has no prior history of Echocardiogram examinations. Risk Factors:Hypertension, Dyslipidemia and Non-Smoker.  Sonographer:    Madeline Finder Referring Phys: [phone] JARED M GARDNER   Sonographer Comments: Technically difficult study  due to poor echo windows, no subcostal window and patient is obese. Image acquisition challenging due to respiratory motion. IMPRESSIONS   1. Left ventricular ejection fraction, by estimation, is 60 to 65%. The left ventricle has normal function. The left ventricle has no regional wall motion abnormalities. There is mild concentric left ventricular hypertrophy. Left ventricular diastolic parameters are consistent with Grade I diastolic dysfunction (impaired relaxation). 2. Right ventricular systolic function is normal. The right ventricular size is normal. Tricuspid regurgitation signal is inadequate for assessing PA pressure. 3. The mitral valve is normal in structure. Trivial mitral valve regurgitation. No evidence of mitral stenosis. 4. The aortic valve is tricuspid. Aortic valve regurgitation is mild. No aortic stenosis is present. 5. The inferior vena cava is normal in size with greater than 50% respiratory variability, suggesting right atrial pressure of 3 mmHg.  Comparison(s): No prior Echocardiogram.  Conclusion(s)/Recommendation(s): No intracardiac source of embolism detected on this transthoracic study. Consider a transesophageal echocardiogram to exclude cardiac source of embolism if clinically indicated.  FINDINGS Left Ventricle: Left ventricular ejection fraction, by estimation, is 60 to 65%. The left ventricle has normal function. The left ventricle has no regional wall motion abnormalities. Definity  contrast agent was given IV to delineate the left ventricular endocardial borders. The left ventricular internal cavity size was normal in size. There is mild concentric left ventricular hypertrophy. Left ventricular diastolic parameters are consistent with Grade I diastolic dysfunction (impaired relaxation).  Right Ventricle: The right ventricular size is normal. No increase in right ventricular wall thickness. Right ventricular systolic function is normal. Tricuspid regurgitation  signal is inadequate for assessing PA pressure.  Left Atrium: Left atrial size was normal in size.  Right Atrium: Right atrial size was normal in size.  Pericardium: There is no evidence of pericardial effusion.  Mitral Valve: The mitral valve is normal in structure. Trivial mitral valve regurgitation. No evidence of mitral valve stenosis.  Tricuspid Valve: The tricuspid valve is normal in structure. Tricuspid valve regurgitation is trivial.  Aortic Valve: The aortic valve is tricuspid. Aortic valve regurgitation is mild. No aortic stenosis is present.  Pulmonic Valve: The pulmonic valve was normal in structure. Pulmonic valve regurgitation is trivial.  Aorta: The aortic root and ascending aorta are structurally normal, with no evidence of dilitation.  Venous: The inferior vena cava is normal in size with greater than 50% respiratory variability, suggesting right atrial pressure of 3 mmHg.  IAS/Shunts: The atrial septum is grossly normal.   LEFT VENTRICLE PLAX 2D LVIDd:         5.10 cm   Diastology LVIDs:         4.00 cm   LV e' medial:    5.26 cm/s LV PW:         1.00 cm   LV E/e' medial:  13.5 LV IVS:        1.00 cm   LV e' lateral:   9.90 cm/s LVOT diam:  2.20 cm   LV E/e' lateral: 7.2 LV SV:         81 LV SV Index:   31 LVOT Area:     3.80 cm   RIGHT VENTRICLE RV S prime:     19.60 cm/s TAPSE (M-mode): 2.6 cm  LEFT ATRIUM             Index        RIGHT ATRIUM           Index LA diam:        3.80 cm 1.47 cm/m   RA Area:     17.20 cm LA Vol (A2C):   42.9 ml 16.60 ml/m  RA Volume:   44.60 ml  17.26 ml/m LA Vol (A4C):   46.2 ml 17.88 ml/m LA Biplane Vol: 45.1 ml 17.45 ml/m AORTIC VALVE LVOT Vmax:   111.00 cm/s LVOT Vmean:  71.800 cm/s LVOT VTI:    0.212 m  AORTA Ao Root diam: 3.60 cm Ao Asc diam:  3.40 cm  MITRAL VALVE MV Area (PHT): 2.87 cm    SHUNTS MV Decel Time: 264 msec    Systemic VTI:  0.21 m MV E velocity: 71.00 cm/s  Systemic Diam: 2.20  cm MV A velocity: 97.80 cm/s MV E/A ratio:  0.73  Powell Sorrow MD Electronically signed by Powell Sorrow MD Signature Date/Time: 03/03/2022/11:44:33 AM    Final      CT SCANS  CT CORONARY MORPH W/CTA COR W/SCORE 02/18/2024  Addendum 02/27/2024  4:52 PM ADDENDUM REPORT: 02/27/2024 16:50  EXAM: OVER-READ INTERPRETATION  CT CHEST  The following report is an over-read performed by radiologist Dr. Andrea Gasman of [facility] on 02/27/2024. This over-read does not include interpretation of cardiac or coronary anatomy or pathology. The coronary CTA interpretation by the cardiologist is attached.  COMPARISON:  None.  FINDINGS: Vascular: Mild aortic atherosclerosis. The included aorta is normal in caliber.  Mediastinum/nodes: No adenopathy or mass. Unremarkable esophagus.  Lungs: No focal airspace disease. 7 mm subpleural left lower lobe nodule, series 309, image 129. Question of subpleural right lower lobe nodule, series 39, image 122, partially obscured by adjacent atelectasis. No pleural fluid. The included airways are patent.  Upper abdomen: No acute or unexpected findings.  Musculoskeletal: There are no acute or suspicious osseous abnormalities. Thoracic spondylosis.  IMPRESSION: 1.  Aortic Atherosclerosis (ICD10-I70.0). 2. A 7 mm subpleural left lower lobe nodule. Question of subpleural right lower lobe nodule, partially obscured by adjacent atelectasis. Non-contrast chest CT at 3-6 months is recommended. If the nodules are stable at time of repeat CT, then future CT at 18-24 months (from today's scan) is considered optional for low-risk patients, but is recommended for high-risk patients. This recommendation follows the consensus statement: Guidelines for Management of Incidental Pulmonary Nodules Detected on CT Images: From the Fleischner Society 2017; Radiology 2017; 284:228-243.   Electronically Signed By: Andrea Gasman  M.D. On: 02/27/2024 16:50  Narrative CLINICAL DATA:  CP  EXAM: Cardiac/Coronary  CTA  TECHNIQUE: The patient was scanned on a GE Apex scanner.  FINDINGS: A 120 kV prospective scan was triggered in the descending thoracic aorta at 111 HU's. Axial non-contrast 3 mm slices were carried out through the heart. The data set was analyzed on a dedicated work station and scored using the Agatson method. Gantry rotation speed was 250 msecs and collimation was .6 mm. No beta blockade and 0.8 mg of sl NTG was given. The 3D data set was reconstructed at  77% of the R-R cycle. Diastolic phases were analyzed on a dedicated work station using MPR, MIP and VRT modes. The patient received 80 cc of contrast.  Aorta: Normal size.  No calcifications.  No dissection.  Aortic Valve:  Trileaflet.  No calcifications.  Coronary Arteries:  Normal coronary origin.  Right dominance.  RCA is a large dominant artery that gives rise to PDA and PLA. There is mixed plaque in the proximal portion of this artery with severe stenosis of 70-99%.  Left main is a large artery that gives rise to LAD and LCX arteries.  LAD is a large vessel that has two sift plaques in its mid portion with mild stenosis of 25-49%. This artery gives rise to moderate size D1 and large D2. D1 has few soft plaques with mild stenosis of 25-49%. D2 has soft plaque in its proximal portion with moderate stenosis of 50-69%.  LCX is a non-dominant artery that gives rise to one large OM1 branch. There is soft plaque in the mid portion of CX with mild stenosis. In the mid portion of OM2 there is soft plaque with mild stenosis.  Other findings:  Normal pulmonary vein drainage into the left atrium. Rt middle pulmonary vein noted - normal variant.  Normal left atrial appendage without a thrombus.  Normal size of the pulmonary artery.  IMPRESSION: 1. Coronary calcium  score of 27. This was 50 percentile for age and sex matched  control.  2. Normal coronary origin with right dominance.  3. CAD-RADS 4 Severe stenosis. (70-99%) prox RCA. Cardiac catheterization or CT FFR is recommended. Consider symptom-guided anti-ischemic pharmacotherapy as well as risk factor modification per guideline directed care.  Electronically Signed: By: Lamar Fitch M.D. On: 02/18/2024 12:57     ______________________________________________________________________________________________          Recent Labs: 01/22/2024: BUN 11; Creatinine, Ser 1.15; NT-Pro BNP 88; Potassium 3.8; Sodium 141  Recent Lipid Panel    Component Value Date/Time   CHOL 223 (H) 03/01/2022 2013   TRIG 232 (H) 03/01/2022 2013   HDL 35 (L) 03/01/2022 2013   CHOLHDL 6.4 03/01/2022 2013   VLDL 46 (H) 03/01/2022 2013   LDLCALC 142 (H) 03/01/2022 2013    Physical Exam:    VS:  BP (!) 186/92   Pulse 84   Ht 6' (1.829 m)   Wt (!) 352 lb 9.6 oz (159.9 kg)   SpO2 98%   BMI 47.82 kg/m     Wt Readings from Last 3 Encounters:  05/04/24 (!) 352 lb 9.6 oz (159.9 kg)  03/18/22 (!) 318 lb (144.2 kg)  03/18/22 (!) 316 lb (143.3 kg)     GEN:  Well nourished, well developed in no acute distress HEENT: Normal NECK: No JVD; No carotid bruits LYMPHATICS: No lymphadenopathy CARDIAC: RRR, no murmurs, rubs, gallops RESPIRATORY:  Clear to auscultation without rales, wheezing or rhonchi  ABDOMEN: Soft, non-tender, non-distended MUSCULOSKELETAL:  No edema; No deformity  SKIN: Warm and dry NEUROLOGIC:  Alert and oriented x 3 PSYCHIATRIC:  Normal affect    Signed, Redell Leiter, MD  05/04/2024 8:23 AM    [facility]      [1]  Current Meds  Medication Sig   amLODipine  (NORVASC ) 10 MG tablet Take 1 tablet (10 mg total) by mouth daily.   aspirin  EC 81 MG tablet Take 1 tablet (81 mg total) by mouth daily.   Bempedoic Acid  (NEXLETOL ) 180 MG TABS Take 1 tablet (180 mg total) by mouth daily.  eplerenone  (INSPRA ) 25 MG tablet  Take 1 tablet (25 mg total) by mouth daily.   fenofibrate  54 MG tablet Take 1 tablet (54 mg total) by mouth daily.   irbesartan  (AVAPRO ) 300 MG tablet Take 1 tablet (300 mg total) by mouth daily.   meloxicam  (MOBIC ) 7.5 MG tablet Take 1-2 tablets (7.5-15 mg total) by mouth daily as needed.   nitroGLYCERIN  (NITROSTAT ) 0.4 MG SL tablet Place 1 tablet (0.4 mg total) under the tongue every 5 (five) minutes as needed for chest pain.   semaglutide -weight management (WEGOVY ) 1 MG/0.5ML SOAJ SQ injection Inject 1 mg into the skin once a week for 28 days.   [START ON 05/15/2024] semaglutide -weight management (WEGOVY ) 1.7 MG/0.75ML SOAJ SQ injection Inject 1.7 mg into the skin once a week for 28 days.   [START ON 06/13/2024] semaglutide -weight management (WEGOVY ) 2.4 MG/0.75ML SOAJ SQ injection Inject 2.4 mg into the skin once a week for 28 days.   tamsulosin  (FLOMAX ) 0.4 MG CAPS capsule Take 1 capsule (0.4 mg total) by mouth daily.   "

## 2024-05-03 NOTE — Progress Notes (Unsigned)
 Cardiology Office Note:    Date:  05/04/2024   ID:  Charles Farley, DOB 05/24/1970, MRN [id number]  PCP:  Meredeth Prentice KIDD, MD  Cardiologist:  Redell Leiter, MD    Referring MD: Meredeth Prentice KIDD, MD    ASSESSMENT:    1. Coronary artery disease of native artery of native heart with stable angina pectoris   2. Primary hypertension   3. Mixed hyperlipidemia   4. Morbid obesity (HCC)    PLAN:    In order of problems listed above:  See discussion below continue medical therapy aspirin  beta-blocker calcium  channel blocker nonstatin lipid-lowering treatment add oral Imdur  refer to coronary angiography and probable intervention Improved increase his MRA dosage and stressed need for compliance weight loss Check labs today including LP(a) April be   Next appointment: 3 months   Medication Adjustments/Labs and Tests Ordered: Current medicines are reviewed at length with the patient today.  Concerns regarding medicines are outlined above.  No orders of the defined types were placed in this encounter.  No orders of the defined types were placed in this encounter.    History of Present Illness:    Charles Farley is a 53 y.o. male with a hx of hypertension hyperlipidemia and previous stroke last seen 01/22/24.  He had cardiac CTA 02/18/2024 with a calcium  score of 27 50th percentile and stenosis felt to be severe in the proximal right coronary artery with diminished FFR.  With stable chest pain pattern CAD is initiated on medical therapy including aspirin  continue beta-blocker amlodipine  and his ARB for hypertensive control and advise GLP-1 agent.  Statin intolerant and takes bempedoic acid  and fenofibrate .  Compliance with diet, lifestyle and medications: Yes but not infrequently forgets to take medications perhaps 50% of the time  Home blood pressures running 140-160 less than 90 No muscle symptoms from lipid-lowering treatment Despite aspirin  beta-blocker calcium  channel blocker and  lipid-lowering therapy having frequent episodes of angina at rest associated with stress typical substernal into the jaw relieved with rest has not needed nitroglycerin  Will check baseline labs today for liver function increase MRA add oral Imdur  and refer for elective coronary angiography and probable PCI for severe right coronary stenosis Past Medical History:  Diagnosis Date   ADHD    Arteriosclerotic cardiovascular disease (ASCVD) 05/03/2024   Chronic kidney disease due to hypertension 05/03/2024   History of stroke without residual deficits 05/03/2024   HLD (hyperlipidemia)    Hypertension    Lower urinary tract symptoms due to benign prostatic hyperplasia 05/03/2024   Passive suicidal ideations 02/12/2020   Persistent adjustment disorder 02/12/2020   Stroke (HCC)    Subcortical infarction (HCC) 03/03/2022    Current Medications: Active Medications[1]    EKGs/Labs/Other Studies Reviewed:    The following studies were reviewed today:  Cardiac Studies & Procedures   ______________________________________________________________________________________________     ECHOCARDIOGRAM  ECHOCARDIOGRAM COMPLETE 03/03/2022  Narrative ECHOCARDIOGRAM REPORT    Patient Name:   Charles Farley Date of Exam: 03/03/2022 Medical Rec #:  [id number]     Height:       72.0 in Accession #:    [id number]    Weight:       315.0 lb Date of Birth:  11/22/1970     BSA:          2.584 m Patient Age:    51 years      BP:           176/89 mmHg Patient Gender: M  HR:           69 bpm. Exam Location:  Inpatient  Procedure: 2D Echo, Cardiac Doppler, Color Doppler and Intracardiac Opacification Agent  Indications:    Stroke I63.9  History:        Patient has no prior history of Echocardiogram examinations. Risk Factors:Hypertension, Dyslipidemia and Non-Smoker.  Sonographer:    Madeline Finder Referring Phys: [phone] JARED M GARDNER   Sonographer Comments: Technically difficult study  due to poor echo windows, no subcostal window and patient is obese. Image acquisition challenging due to respiratory motion. IMPRESSIONS   1. Left ventricular ejection fraction, by estimation, is 60 to 65%. The left ventricle has normal function. The left ventricle has no regional wall motion abnormalities. There is mild concentric left ventricular hypertrophy. Left ventricular diastolic parameters are consistent with Grade I diastolic dysfunction (impaired relaxation). 2. Right ventricular systolic function is normal. The right ventricular size is normal. Tricuspid regurgitation signal is inadequate for assessing PA pressure. 3. The mitral valve is normal in structure. Trivial mitral valve regurgitation. No evidence of mitral stenosis. 4. The aortic valve is tricuspid. Aortic valve regurgitation is mild. No aortic stenosis is present. 5. The inferior vena cava is normal in size with greater than 50% respiratory variability, suggesting right atrial pressure of 3 mmHg.  Comparison(s): No prior Echocardiogram.  Conclusion(s)/Recommendation(s): No intracardiac source of embolism detected on this transthoracic study. Consider a transesophageal echocardiogram to exclude cardiac source of embolism if clinically indicated.  FINDINGS Left Ventricle: Left ventricular ejection fraction, by estimation, is 60 to 65%. The left ventricle has normal function. The left ventricle has no regional wall motion abnormalities. Definity  contrast agent was given IV to delineate the left ventricular endocardial borders. The left ventricular internal cavity size was normal in size. There is mild concentric left ventricular hypertrophy. Left ventricular diastolic parameters are consistent with Grade I diastolic dysfunction (impaired relaxation).  Right Ventricle: The right ventricular size is normal. No increase in right ventricular wall thickness. Right ventricular systolic function is normal. Tricuspid regurgitation  signal is inadequate for assessing PA pressure.  Left Atrium: Left atrial size was normal in size.  Right Atrium: Right atrial size was normal in size.  Pericardium: There is no evidence of pericardial effusion.  Mitral Valve: The mitral valve is normal in structure. Trivial mitral valve regurgitation. No evidence of mitral valve stenosis.  Tricuspid Valve: The tricuspid valve is normal in structure. Tricuspid valve regurgitation is trivial.  Aortic Valve: The aortic valve is tricuspid. Aortic valve regurgitation is mild. No aortic stenosis is present.  Pulmonic Valve: The pulmonic valve was normal in structure. Pulmonic valve regurgitation is trivial.  Aorta: The aortic root and ascending aorta are structurally normal, with no evidence of dilitation.  Venous: The inferior vena cava is normal in size with greater than 50% respiratory variability, suggesting right atrial pressure of 3 mmHg.  IAS/Shunts: The atrial septum is grossly normal.   LEFT VENTRICLE PLAX 2D LVIDd:         5.10 cm   Diastology LVIDs:         4.00 cm   LV e' medial:    5.26 cm/s LV PW:         1.00 cm   LV E/e' medial:  13.5 LV IVS:        1.00 cm   LV e' lateral:   9.90 cm/s LVOT diam:     2.20 cm   LV E/e' lateral: 7.2 LV SV:  81 LV SV Index:   31 LVOT Area:     3.80 cm   RIGHT VENTRICLE RV S prime:     19.60 cm/s TAPSE (M-mode): 2.6 cm  LEFT ATRIUM             Index        RIGHT ATRIUM           Index LA diam:        3.80 cm 1.47 cm/m   RA Area:     17.20 cm LA Vol (A2C):   42.9 ml 16.60 ml/m  RA Volume:   44.60 ml  17.26 ml/m LA Vol (A4C):   46.2 ml 17.88 ml/m LA Biplane Vol: 45.1 ml 17.45 ml/m AORTIC VALVE LVOT Vmax:   111.00 cm/s LVOT Vmean:  71.800 cm/s LVOT VTI:    0.212 m  AORTA Ao Root diam: 3.60 cm Ao Asc diam:  3.40 cm  MITRAL VALVE MV Area (PHT): 2.87 cm    SHUNTS MV Decel Time: 264 msec    Systemic VTI:  0.21 m MV E velocity: 71.00 cm/s  Systemic Diam: 2.20  cm MV A velocity: 97.80 cm/s MV E/A ratio:  0.73  Powell Sorrow MD Electronically signed by Powell Sorrow MD Signature Date/Time: 03/03/2022/11:44:33 AM    Final      CT SCANS  CT CORONARY MORPH W/CTA COR W/SCORE 02/18/2024  Addendum 02/27/2024  4:52 PM ADDENDUM REPORT: 02/27/2024 16:50  EXAM: OVER-READ INTERPRETATION  CT CHEST  The following report is an over-read performed by radiologist Dr. Andrea Gasman of [facility] on 02/27/2024. This over-read does not include interpretation of cardiac or coronary anatomy or pathology. The coronary CTA interpretation by the cardiologist is attached.  COMPARISON:  None.  FINDINGS: Vascular: Mild aortic atherosclerosis. The included aorta is normal in caliber.  Mediastinum/nodes: No adenopathy or mass. Unremarkable esophagus.  Lungs: No focal airspace disease. 7 mm subpleural left lower lobe nodule, series 309, image 129. Question of subpleural right lower lobe nodule, series 39, image 122, partially obscured by adjacent atelectasis. No pleural fluid. The included airways are patent.  Upper abdomen: No acute or unexpected findings.  Musculoskeletal: There are no acute or suspicious osseous abnormalities. Thoracic spondylosis.  IMPRESSION: 1.  Aortic Atherosclerosis (ICD10-I70.0). 2. A 7 mm subpleural left lower lobe nodule. Question of subpleural right lower lobe nodule, partially obscured by adjacent atelectasis. Non-contrast chest CT at 3-6 months is recommended. If the nodules are stable at time of repeat CT, then future CT at 18-24 months (from today's scan) is considered optional for low-risk patients, but is recommended for high-risk patients. This recommendation follows the consensus statement: Guidelines for Management of Incidental Pulmonary Nodules Detected on CT Images: From the Fleischner Society 2017; Radiology 2017; 284:228-243.   Electronically Signed By: Andrea Gasman  M.D. On: 02/27/2024 16:50  Narrative CLINICAL DATA:  CP  EXAM: Cardiac/Coronary  CTA  TECHNIQUE: The patient was scanned on a GE Apex scanner.  FINDINGS: A 120 kV prospective scan was triggered in the descending thoracic aorta at 111 HU's. Axial non-contrast 3 mm slices were carried out through the heart. The data set was analyzed on a dedicated work station and scored using the Agatson method. Gantry rotation speed was 250 msecs and collimation was .6 mm. No beta blockade and 0.8 mg of sl NTG was given. The 3D data set was reconstructed at 77% of the R-R cycle. Diastolic phases were analyzed on a dedicated work station using MPR, MIP and  VRT modes. The patient received 80 cc of contrast.  Aorta: Normal size.  No calcifications.  No dissection.  Aortic Valve:  Trileaflet.  No calcifications.  Coronary Arteries:  Normal coronary origin.  Right dominance.  RCA is a large dominant artery that gives rise to PDA and PLA. There is mixed plaque in the proximal portion of this artery with severe stenosis of 70-99%.  Left main is a large artery that gives rise to LAD and LCX arteries.  LAD is a large vessel that has two sift plaques in its mid portion with mild stenosis of 25-49%. This artery gives rise to moderate size D1 and large D2. D1 has few soft plaques with mild stenosis of 25-49%. D2 has soft plaque in its proximal portion with moderate stenosis of 50-69%.  LCX is a non-dominant artery that gives rise to one large OM1 branch. There is soft plaque in the mid portion of CX with mild stenosis. In the mid portion of OM2 there is soft plaque with mild stenosis.  Other findings:  Normal pulmonary vein drainage into the left atrium. Rt middle pulmonary vein noted - normal variant.  Normal left atrial appendage without a thrombus.  Normal size of the pulmonary artery.  IMPRESSION: 1. Coronary calcium  score of 27. This was 50 percentile for age and sex matched  control.  2. Normal coronary origin with right dominance.  3. CAD-RADS 4 Severe stenosis. (70-99%) prox RCA. Cardiac catheterization or CT FFR is recommended. Consider symptom-guided anti-ischemic pharmacotherapy as well as risk factor modification per guideline directed care.  Electronically Signed: By: Lamar Fitch M.D. On: 02/18/2024 12:57     ______________________________________________________________________________________________          Recent Labs: 01/22/2024: BUN 11; Creatinine, Ser 1.15; NT-Pro BNP 88; Potassium 3.8; Sodium 141  Recent Lipid Panel    Component Value Date/Time   CHOL 223 (H) 03/01/2022 2013   TRIG 232 (H) 03/01/2022 2013   HDL 35 (L) 03/01/2022 2013   CHOLHDL 6.4 03/01/2022 2013   VLDL 46 (H) 03/01/2022 2013   LDLCALC 142 (H) 03/01/2022 2013    Physical Exam:    VS:  BP (!) 186/92   Pulse 84   Ht 6' (1.829 m)   Wt (!) 352 lb 9.6 oz (159.9 kg)   SpO2 98%   BMI 47.82 kg/m     Wt Readings from Last 3 Encounters:  05/04/24 (!) 352 lb 9.6 oz (159.9 kg)  03/18/22 (!) 318 lb (144.2 kg)  03/18/22 (!) 316 lb (143.3 kg)     GEN:  Well nourished, well developed in no acute distress HEENT: Normal NECK: No JVD; No carotid bruits LYMPHATICS: No lymphadenopathy CARDIAC: RRR, no murmurs, rubs, gallops RESPIRATORY:  Clear to auscultation without rales, wheezing or rhonchi  ABDOMEN: Soft, non-tender, non-distended MUSCULOSKELETAL:  No edema; No deformity  SKIN: Warm and dry NEUROLOGIC:  Alert and oriented x 3 PSYCHIATRIC:  Normal affect    Signed, Redell Leiter, MD  05/04/2024 8:23 AM    [facility]      [1]  Current Meds  Medication Sig   amLODipine  (NORVASC ) 10 MG tablet Take 1 tablet (10 mg total) by mouth daily.   aspirin  EC 81 MG tablet Take 1 tablet (81 mg total) by mouth daily.   Bempedoic Acid  (NEXLETOL ) 180 MG TABS Take 1 tablet (180 mg total) by mouth daily.   eplerenone  (INSPRA ) 25 MG tablet  Take 1 tablet (25 mg total) by mouth daily.  fenofibrate  54 MG tablet Take 1 tablet (54 mg total) by mouth daily.   irbesartan  (AVAPRO ) 300 MG tablet Take 1 tablet (300 mg total) by mouth daily.   meloxicam  (MOBIC ) 7.5 MG tablet Take 1-2 tablets (7.5-15 mg total) by mouth daily as needed.   nitroGLYCERIN  (NITROSTAT ) 0.4 MG SL tablet Place 1 tablet (0.4 mg total) under the tongue every 5 (five) minutes as needed for chest pain.   semaglutide -weight management (WEGOVY ) 1 MG/0.5ML SOAJ SQ injection Inject 1 mg into the skin once a week for 28 days.   [START ON 05/15/2024] semaglutide -weight management (WEGOVY ) 1.7 MG/0.75ML SOAJ SQ injection Inject 1.7 mg into the skin once a week for 28 days.   [START ON 06/13/2024] semaglutide -weight management (WEGOVY ) 2.4 MG/0.75ML SOAJ SQ injection Inject 2.4 mg into the skin once a week for 28 days.   tamsulosin  (FLOMAX ) 0.4 MG CAPS capsule Take 1 capsule (0.4 mg total) by mouth daily.

## 2024-05-04 ENCOUNTER — Other Ambulatory Visit (HOSPITAL_COMMUNITY): Payer: Self-pay

## 2024-05-04 ENCOUNTER — Encounter: Payer: Self-pay | Admitting: Cardiology

## 2024-05-04 ENCOUNTER — Other Ambulatory Visit: Payer: Self-pay

## 2024-05-04 ENCOUNTER — Ambulatory Visit: Attending: Cardiology | Admitting: Cardiology

## 2024-05-04 VITALS — BP 186/92 | HR 84 | Ht 72.0 in | Wt 352.6 lb

## 2024-05-04 DIAGNOSIS — E782 Mixed hyperlipidemia: Secondary | ICD-10-CM

## 2024-05-04 DIAGNOSIS — I10 Essential (primary) hypertension: Secondary | ICD-10-CM

## 2024-05-04 DIAGNOSIS — I25118 Atherosclerotic heart disease of native coronary artery with other forms of angina pectoris: Secondary | ICD-10-CM | POA: Diagnosis not present

## 2024-05-04 MED ORDER — EPLERENONE 50 MG PO TABS
50.0000 mg | ORAL_TABLET | Freq: Every day | ORAL | 3 refills | Status: AC
  Filled 2024-05-04: qty 90, 90d supply, fill #0

## 2024-05-04 MED ORDER — ISOSORBIDE MONONITRATE ER 30 MG PO TB24
30.0000 mg | ORAL_TABLET | Freq: Every day | ORAL | 3 refills | Status: DC
  Filled 2024-05-04: qty 90, 90d supply, fill #0

## 2024-05-04 NOTE — Patient Instructions (Addendum)
 Medication Instructions:  Your physician has recommended you make the following change in your medication:  Increase Inspra  to 50 mg daily. Start taking Imdur  30 mg daily. Take 81 mg coated aspirin  daily. Use nitroglycerin  as needed for chest pain.  *If you need a refill on your cardiac medications before your next appointment, please call your pharmacy*   Lab Work: Your physician recommends that you return for lab work prior within 7 days of your cath:  Lipids, Apo B, Lpa, CMP, & CBC  You need to have labs done when you are fasting.  You can come Monday through Friday 8:30 am to 12:00 pm and 1:15 to 4:30. You do not need to make an appointment as the order has already been placed.    If you have labs (blood work) drawn today and your tests are completely normal, you will receive your results only by: MyChart Message (if you have MyChart) OR A paper copy in the mail If you have any lab test that is abnormal or we need to change your treatment, we will call you to review the results.   Testing/Procedures:  [facility] [place]. [facility] [place] [address] [place] [address] Dept: [phone] Loc: [phone]  Charles Farley  05/04/2024  You are scheduled for a Cardiac Catheterization on Wednesday, January 7 with Dr. Alm Clay.  1. Please arrive at the [facility] (Main Entrance A) at [facility]: [address] [place], [place] [address] at 9:30 AM (This time is 2 hour(s) before your procedure to ensure your preparation).   Free valet parking service is available. You will check in at ADMITTING. The support person will be asked to wait in the waiting room.  It is OK to have someone drop you off and come back when you are ready to be discharged.    Special note: Every effort is made to have your procedure done on time. Please understand that emergencies sometimes delay scheduled procedures.  2.  Diet: NPO: Nothing to eat OR drink after midnight. (For TEE and Cath the same day)   3. Hydration: You need to be well hydrated before your procedure. On January 7, you may drink approved liquids (see below) until 2 hours before the procedure, with 16 oz of water as your last intake.   List of approved liquids water, clear juice, clear tea, black coffee, fruit juices, non-citric and without pulp, carbonated beverages, Gatorade, Kool -Aid, plain Jello-O and plain ice popsicles.  4. Labs: You will need to have blood drawn on Friday, January 2 at [facility]: [address] [place] . You do not need to be fasting.  5. Medication instructions in preparation for your procedure:   Contrast Allergy: No   Stop taking, Avapro  (Irbesartan ) Tuesday, January 6,, Mobic  (Meloxicam ) Tuesday, January 6,   On the morning of your procedure, take your Aspirin  81 mg and any morning medicines NOT listed above.  You may use sips of water.  6. Plan to go home the same day, you will only stay overnight if medically necessary. 7. Bring a current list of your medications and current insurance cards. 8. You MUST have a responsible person to drive you home. 9. Someone MUST be with you the first 24 hours after you arrive home or your discharge will be delayed. 10. Please wear clothes that are easy to get on and off and wear slip-on shoes.  Thank  you for allowing us  to care for you!   -- [facility]    Follow-Up: At [facility], you and your health needs are our priority.  As part of our continuing mission to provide you with exceptional heart care, we have created designated Provider Care Teams.  These Care Teams include your primary Cardiologist (physician) and Advanced Practice Providers (APPs -  Physician Assistants and Nurse Practitioners) who all work together to provide you with the care you need, when you need it.  We recommend signing up for the patient portal  called MyChart.  Sign up information is provided on this After Visit Summary.  MyChart is used to connect with patients for Virtual Visits (Telemedicine).  Patients are able to view lab/test results, encounter notes, upcoming appointments, etc.  Non-urgent messages can be sent to your provider as well.   To learn more about what you can do with MyChart, go to [email or web address].    Your next appointment:   3 month(s)  The format for your next appointment:   In Person  Provider:   Redell Leiter, MD   Other Instructions  Coronary Angiogram With Stent Coronary angiogram with stent placement is a procedure to widen or open a narrow blood vessel of the heart (coronary artery). Arteries may become blocked by cholesterol buildup (plaques) in the lining of the artery wall. When a coronary artery becomes partially blocked, blood flow to that area decreases. This may lead to chest pain or a heart attack (myocardial infarction). A stent is a small piece of metal that looks like mesh or spring. Stent placement may be done as treatment after a heart attack, or to prevent a heart attack if a blocked artery is found by a coronary angiogram. Let your health care provider know about: Any allergies you have, including allergies to medicines or contrast dye. All medicines you are taking, including vitamins, herbs, eye drops, creams, and over-the-counter medicines. Any problems you or family members have had with anesthetic medicines. Any blood disorders you have. Any surgeries you have had. Any medical conditions you have, including kidney problems or kidney failure. Whether you are pregnant or may be pregnant. Whether you are breastfeeding. What are the risks? Generally, this is a safe procedure. However, serious problems may occur, including: Damage to nearby structures or organs, such as the heart, blood vessels, or kidneys. A return of blockage. Bleeding, infection, or bruising at the insertion  site. A collection of blood under the skin (hematoma) at the insertion site. A blood clot in another part of the body. Allergic reaction to medicines or dyes. Bleeding into the abdomen (retroperitoneal bleeding). Stroke (rare). Heart attack (rare). What happens before the procedure? Staying hydrated Follow instructions from your health care provider about hydration, which may include: Up to 2 hours before the procedure - you may continue to drink clear liquids, such as water, clear fruit juice, black coffee, and plain tea.    Eating and drinking restrictions Follow instructions from your health care provider about eating and drinking, which may include: 8 hours before the procedure - stop eating heavy meals or foods, such as meat, fried foods, or fatty foods. 6 hours before the procedure - stop eating light meals or foods, such as toast or cereal. 2 hours before the procedure - stop drinking clear liquids. Medicines Ask your health care provider about: Changing or stopping your regular medicines. This is especially important if you are taking diabetes medicines or blood thinners. Taking medicines  such as aspirin  and ibuprofen. These medicines can thin your blood. Do not take these medicines unless your health care provider tells you to take them. Generally, aspirin  is recommended before a thin tube, called a catheter, is passed through a blood vessel and inserted into the heart (cardiac catheterization). Taking over-the-counter medicines, vitamins, herbs, and supplements. General instructions Do not use any products that contain nicotine or tobacco for at least 4 weeks before the procedure. These products include cigarettes, e-cigarettes, and chewing tobacco. If you need help quitting, ask your health care provider. Plan to have someone take you home from the hospital or clinic. If you will be going home right after the procedure, plan to have someone with you for 24 hours. You may have  tests and imaging procedures. Ask your health care provider: How your insertion site will be marked. Ask which artery will be used for the procedure. What steps will be taken to help prevent infection. These may include: Removing hair at the insertion site. Washing skin with a germ-killing soap. Taking antibiotic medicine. What happens during the procedure? An IV will be inserted into one of your veins. Electrodes may be placed on your chest to monitor your heart rate during the procedure. You will be given one or more of the following: A medicine to help you relax (sedative). A medicine to numb the area (local anesthetic) for catheter insertion. A small incision will be made for catheter insertion. The catheter will be inserted into an artery using a guide wire. The location may be in your groin, your wrist, or the fold of your arm (near your elbow). An X-ray procedure (fluoroscopy) will be used to help guide the catheter to the opening of the heart arteries. A dye will be injected into the catheter. X-rays will be taken. The dye helps to show where any narrowing or blockages are located in the arteries. Tell your health care provider if you have chest pain or trouble breathing. A tiny wire will be guided to the blocked spot, and a balloon will be inflated to make the artery wider. The stent will be expanded to crush the plaques into the wall of the vessel. The stent will hold the area open and improve the blood flow. Most stents have a drug coating to reduce the risk of the stent narrowing over time. The artery may be made wider using a drill, laser, or other tools that remove plaques. The catheter will be removed when the blood flow improves. The stent will stay where it was placed, and the lining of the artery will grow over it. A bandage (dressing) will be placed on the insertion site. Pressure will be applied to stop bleeding. The IV will be removed. This procedure may vary among health  care providers and hospitals.    What happens after the procedure? Your blood pressure, heart rate, breathing rate, and blood oxygen level will be monitored until you leave the hospital or clinic. If the procedure is done through the leg, you will lie flat in bed for a few hours or for as long as told by your health care provider. You will be instructed not to bend or cross your legs. The insertion site and the pulse in your foot or wrist will be checked often. You may have more blood tests, X-rays, and a test that records the electrical activity of your heart (electrocardiogram, or ECG). Do not drive for 24 hours if you were given a sedative during your procedure. Summary  Coronary angiogram with stent placement is a procedure to widen or open a narrowed coronary artery. This is done to treat heart problems. Before the procedure, let your health care provider know about all the medical conditions and surgeries you have or have had. This is a safe procedure. However, some problems may occur, including damage to nearby structures or organs, bleeding, blood clots, or allergies. Follow your health care provider's instructions about eating, drinking, medicines, and other lifestyle changes, such as quitting tobacco use before the procedure. This information is not intended to replace advice given to you by your health care provider. Make sure you discuss any questions you have with your health care provider. Document Revised: 11/24/2018 Document Reviewed: 11/24/2018 Elsevier Patient Education  2021 Elsevier Inc.  Aspirin  and Your Heart Aspirin  is a medicine that prevents the platelets in your blood from sticking together. Platelets are the cells that your blood uses for clotting. Aspirin  can be used to help reduce the risk of blood clots, heart attacks, and other heart-related problems. What are the risks? Daily use of aspirin  can cause side effects. Some of these include: Bleeding. Bleeding can be  minor or serious. An example of minor bleeding is bleeding from a cut, and the bleeding does not stop. An example of more serious bleeding is stomach bleeding or, rarely, bleeding into the brain. Your risk of bleeding increases if you are also taking NSAIDs, such as ibuprofen. Increased bruising. Upset stomach. An allergic reaction. People who have growths inside the nose (nasal polyps) have an increased risk of developing an aspirin  allergy. How to use aspirin  to care for your heart Take aspirin  only as told by your health care provider. Make sure that you understand how much to take and what form to take. The two forms of aspirin  are: Non-enteric-coated.This type of aspirin  does not have a coating and is absorbed quickly. This type of aspirin  also comes in a chewable form. Enteric-coated. This type of aspirin  has a coating that releases the medicine very slowly. Enteric-coated aspirin  might cause less stomach upset than non-enteric-coated aspirin . This type of aspirin  should not be chewed or crushed. Work with your health care provider to find out whether it is safe and beneficial for you to take aspirin  daily. Taking aspirin  daily may be helpful if: You have had a heart attack or chest pain, or you are at risk for a heart attack. You have a condition in which certain heart vessels are blocked (coronary artery disease), and you have had a procedure to treat it. Examples are: Open-heart surgery, such as coronary artery bypass surgery (CABG). Coronary angioplasty,which is done to widen a blood vessel of your heart. Having a small mesh tube, or stent, placed in your coronary artery. You have had certain types of stroke or a mini-stroke known as a transient ischemic attack (TIA). You have a narrowing of the arteries that supply the limbs (peripheral artery disease, or PAD). You have long-term (chronic) heart rhythm problems, such as atrial fibrillation, and your health care provider thinks aspirin  may  help. You have valve disease or have had surgery on a valve. You are considered at increased risk of developing coronary artery disease or PAD.    Follow these instructions at home Medicines Take over-the-counter and prescription medicines only as told by your health care provider. If you are taking blood thinners: Talk with your health care provider before you take any medicines that contain aspirin  or NSAIDs, such as ibuprofen. These medicines increase your  risk for dangerous bleeding. Take your medicine exactly as told, at the same time every day. Avoid activities that could cause injury or bruising, and follow instructions about how to prevent falls. Wear a medical alert bracelet or carry a card that lists what medicines you take. General instructions Do not drink alcohol if: Your health care provider tells you not to drink. You are pregnant, may be pregnant, or are planning to become pregnant. If you drink alcohol: Limit how much you use to: 0-1 drink a day for women. 0-2 drinks a day for men. Be aware of how much alcohol is in your drink. In the [place] one drink equals one 12 oz bottle of beer (355 mL), one 5 oz glass of wine (148 mL), or one 1 oz glass of hard liquor (44 mL). Keep all follow-up visits as told by your health care provider. This is important. Where to find more information The American Heart Association: www.heart.org Contact a health care provider if you have: Unusual bleeding or bruising. Stomach pain or nausea. Ringing in your ears. An allergic reaction that causes hives, itchy skin, or swelling of the lips, tongue, or face. Get help right away if: You notice that your bowel movements are bloody, or dark red or black in color. You vomit or cough up blood. You have blood in your urine. You cough, breathe loudly (wheeze), or feel short of breath. You have chest pain, especially if the pain spreads to your arms, back, neck, or jaw. You have a headache with  confusion. You have any symptoms of a stroke. BE FAST is an easy way to remember the main warning signs of a stroke: B - Balance. Signs are dizziness, sudden trouble walking, or loss of balance. E - Eyes. Signs are trouble seeing or a sudden change in vision. F - Face. Signs are sudden weakness or numbness of the face, or the face or eyelid drooping on one side. A - Arms. Signs are weakness or numbness in an arm. This happens suddenly and usually on one side of the body. S - Speech. Signs are sudden trouble speaking, slurred speech, or trouble understanding what people say. T - Time. Time to call emergency services. Write down what time symptoms started. You have other signs of a stroke, such as: A sudden, severe headache with no known cause. Nausea or vomiting. Seizure. These symptoms may represent a serious problem that is an emergency. Do not wait to see if the symptoms will go away. Get medical help right away. Call your local emergency services (911 in the [place] Do not drive yourself to the hospital. Summary Aspirin  use can help reduce the risk of blood clots, heart attacks, and other heart-related problems. Daily use of aspirin  can cause side effects. Take aspirin  only as told by your health care provider. Make sure that you understand how much to take and what form to take. Your health care provider will help you determine whether it is safe and beneficial for you to take aspirin  daily. This information is not intended to replace advice given to you by your health care provider. Make sure you discuss any questions you have with your health care provider. Document Revised: 02/07/2019 Document Reviewed: 02/07/2019 Elsevier Patient Education  2021 Elsevier Inc. Nitroglycerin  sublingual tablets What is this medicine? NITROGLYCERIN  (nye troe GLI ser in) is a type of vasodilator. It relaxes blood vessels, increasing the blood and oxygen supply to your heart. This medicine is used to  relieve chest pain  caused by angina. It is also used to prevent chest pain before activities like climbing stairs, going outdoors in cold weather, or sexual activity. This medicine may be used for other purposes; ask your health care provider or pharmacist if you have questions. COMMON BRAND NAME(S): Nitroquick, Nitrostat , Nitrotab What should I tell my health care provider before I take this medicine? They need to know if you have any of these conditions: anemia head injury, recent stroke, or bleeding in the brain liver disease previous heart attack an unusual or allergic reaction to nitroglycerin , other medicines, foods, dyes, or preservatives pregnant or trying to get pregnant breast-feeding How should I use this medicine? Take this medicine by mouth as needed. Use at the first sign of an angina attack (chest pain or tightness). You can also take this medicine 5 to 10 minutes before an event likely to produce chest pain. Follow the directions exactly as written on the prescription label. Place one tablet under your tongue and let it dissolve. Do not swallow whole. Replace the dose if you accidentally swallow it. It will help if your mouth is not dry. Saliva around the tablet will help it to dissolve more quickly. Do not eat or drink, smoke or chew tobacco while a tablet is dissolving. Sit down when taking this medicine. In an angina attack, you should feel better within 5 minutes after your first dose. You can take a dose every 5 minutes up to a total of 3 doses. If you do not feel better or feel worse after 1 dose, call 9-1-1 at once. Do not take more than 3 doses in 15 minutes. Your health care provider might give you other directions. Follow those directions if he or she does. Do not take your medicine more often than directed. Talk to your health care provider about the use of this medicine in children. Special care may be needed. Overdosage: If you think you have taken too much of this  medicine contact a poison control center or emergency room at once. NOTE: This medicine is only for you. Do not share this medicine with others. What if I miss a dose? This does not apply. This medicine is only used as needed. What may interact with this medicine? Do not take this medicine with any of the following medications: certain migraine medicines like ergotamine and dihydroergotamine (DHE) medicines used to treat erectile dysfunction like sildenafil, tadalafil, and vardenafil riociguat This medicine may also interact with the following medications: alteplase aspirin  heparin medicines for high blood pressure medicines for mental depression other medicines used to treat angina phenothiazines like chlorpromazine, mesoridazine, prochlorperazine, thioridazine This list may not describe all possible interactions. Give your health care provider a list of all the medicines, herbs, non-prescription drugs, or dietary supplements you use. Also tell them if you smoke, drink alcohol, or use illegal drugs. Some items may interact with your medicine. What should I watch for while using this medicine? Tell your doctor or health care professional if you feel your medicine is no longer working. Keep this medicine with you at all times. Sit or lie down when you take your medicine to prevent falling if you feel dizzy or faint after using it. Try to remain calm. This will help you to feel better faster. If you feel dizzy, take several deep breaths and lie down with your feet propped up, or bend forward with your head resting between your knees. You may get drowsy or dizzy. Do not drive, use machinery, or do  anything that needs mental alertness until you know how this drug affects you. Do not stand or sit up quickly, especially if you are an older patient. This reduces the risk of dizzy or fainting spells. Alcohol can make you more drowsy and dizzy. Avoid alcoholic drinks. Do not treat yourself for coughs,  colds, or pain while you are taking this medicine without asking your doctor or health care professional for advice. Some ingredients may increase your blood pressure. What side effects may I notice from receiving this medicine? Side effects that you should report to your doctor or health care professional as soon as possible: allergic reactions (skin rash, itching or hives; swelling of the face, lips, or tongue) low blood pressure (dizziness; feeling faint or lightheaded, falls; unusually weak or tired) low red blood cell counts (trouble breathing; feeling faint; lightheaded, falls; unusually weak or tired) Side effects that usually do not require medical attention (report to your doctor or health care professional if they continue or are bothersome): facial flushing (redness) headache nausea, vomiting This list may not describe all possible side effects. Call your doctor for medical advice about side effects. You may report side effects to FDA at 1-800-FDA-1088. Where should I keep my medicine? Keep out of the reach of children. Store at room temperature between 20 and 25 degrees C (68 and 77 degrees F). Store in retail buyer. Protect from light and moisture. Keep tightly closed. Throw away any unused medicine after the expiration date. NOTE: This sheet is a summary. It may not cover all possible information. If you have questions about this medicine, talk to your doctor, pharmacist, or health care provider.  2021 Elsevier/Gold Standard (2018-02-03 16:46:32)

## 2024-05-24 ENCOUNTER — Telehealth: Payer: Self-pay | Admitting: *Deleted

## 2024-05-24 ENCOUNTER — Ambulatory Visit: Payer: Self-pay | Admitting: Cardiology

## 2024-05-24 LAB — CBC WITH DIFFERENTIAL/PLATELET
Basophils Absolute: 0.1 x10E3/uL (ref 0.0–0.2)
Basos: 1 %
EOS (ABSOLUTE): 0.4 x10E3/uL (ref 0.0–0.4)
Eos: 4 %
Hematocrit: 40 % (ref 37.5–51.0)
Hemoglobin: 14.1 g/dL (ref 13.0–17.7)
Immature Grans (Abs): 0.1 x10E3/uL (ref 0.0–0.1)
Immature Granulocytes: 1 %
Lymphocytes Absolute: 1.7 x10E3/uL (ref 0.7–3.1)
Lymphs: 17 %
MCH: 29.7 pg (ref 26.6–33.0)
MCHC: 35.3 g/dL (ref 31.5–35.7)
MCV: 84 fL (ref 79–97)
Monocytes Absolute: 0.7 x10E3/uL (ref 0.1–0.9)
Monocytes: 7 %
Neutrophils Absolute: 6.9 x10E3/uL (ref 1.4–7.0)
Neutrophils: 70 %
Platelets: 321 x10E3/uL (ref 150–450)
RBC: 4.75 x10E6/uL (ref 4.14–5.80)
RDW: 13.8 % (ref 11.6–15.4)
WBC: 9.7 x10E3/uL (ref 3.4–10.8)

## 2024-05-24 LAB — COMPREHENSIVE METABOLIC PANEL WITH GFR
ALT: 19 IU/L (ref 0–44)
AST: 17 IU/L (ref 0–40)
Albumin: 4.4 g/dL (ref 3.8–4.9)
Alkaline Phosphatase: 60 IU/L (ref 47–123)
BUN/Creatinine Ratio: 12 (ref 9–20)
BUN: 12 mg/dL (ref 6–24)
Bilirubin Total: 0.7 mg/dL (ref 0.0–1.2)
CO2: 26 mmol/L (ref 20–29)
Calcium: 9.1 mg/dL (ref 8.7–10.2)
Chloride: 101 mmol/L (ref 96–106)
Creatinine, Ser: 0.97 mg/dL (ref 0.76–1.27)
Globulin, Total: 1.9 g/dL (ref 1.5–4.5)
Glucose: 108 mg/dL — ABNORMAL HIGH (ref 70–99)
Potassium: 3.8 mmol/L (ref 3.5–5.2)
Sodium: 140 mmol/L (ref 134–144)
Total Protein: 6.3 g/dL (ref 6.0–8.5)
eGFR: 94 mL/min/1.73

## 2024-05-24 NOTE — Addendum Note (Signed)
 Addended by: ONEITA BERLINER on: 05/24/2024 01:44 PM   Modules accepted: Orders

## 2024-05-24 NOTE — Telephone Encounter (Signed)
 Cardiac Catheterization scheduled at [facility] for: Wednesday May 25, 2024 11:30 AM Arrival time [facility] Main Entrance A at: 9:30 AM  Diet: -Nothing to eat after midnight.  Hydration: -May drink clear liquids until 2 hours before the procedure.  Approved liquids: Water , clear tea, black coffee, fruit juices-non-citric and without pulp,Gatorade, plain Jello/popsicles.   -Please drink 16 oz of water  2 hours before procedure.  Medication instructions: -Hold:  Inspra -AM of procedure  Wegovy -weekly-ask day of week -Other usual morning medications can be taken including aspirin  81 mg.  Plan to go home the same day, you will only stay overnight if medically necessary.  You must have responsible adult to drive you home.  Someone must be with you the first 24 hours after you arrive home.  Left message for patient to call back, ask if he has had BMP/CBC done.

## 2024-05-24 NOTE — Telephone Encounter (Signed)
 Left message for patient to call back to review procedure instructions, ask if pre-procedure labs have been done.

## 2024-05-24 NOTE — Telephone Encounter (Signed)
 Patient tells me he had STAT lab done today.

## 2024-05-24 NOTE — Addendum Note (Signed)
 Addended by: ONEITA BERLINER on: 05/24/2024 03:16 PM   Modules accepted: Orders

## 2024-05-25 ENCOUNTER — Other Ambulatory Visit: Payer: Self-pay

## 2024-05-25 ENCOUNTER — Encounter (HOSPITAL_COMMUNITY)
Admission: RE | Disposition: A | Discharge status: Home / Self Care | Payer: Self-pay | Source: Home / Self Care | Attending: Cardiology

## 2024-05-25 ENCOUNTER — Other Ambulatory Visit (HOSPITAL_COMMUNITY): Payer: Self-pay

## 2024-05-25 ENCOUNTER — Ambulatory Visit (HOSPITAL_COMMUNITY)
Admission: RE | Admit: 2024-05-25 | Discharge: 2024-05-25 | Disposition: A | Discharge status: Home / Self Care | Attending: Cardiology | Admitting: Cardiology

## 2024-05-25 DIAGNOSIS — I25118 Atherosclerotic heart disease of native coronary artery with other forms of angina pectoris: Secondary | ICD-10-CM

## 2024-05-25 DIAGNOSIS — I1310 Hypertensive heart and chronic kidney disease without heart failure, with stage 1 through stage 4 chronic kidney disease, or unspecified chronic kidney disease: Secondary | ICD-10-CM | POA: Insufficient documentation

## 2024-05-25 DIAGNOSIS — I209 Angina pectoris, unspecified: Secondary | ICD-10-CM | POA: Diagnosis present

## 2024-05-25 DIAGNOSIS — Z8673 Personal history of transient ischemic attack (TIA), and cerebral infarction without residual deficits: Secondary | ICD-10-CM | POA: Diagnosis not present

## 2024-05-25 DIAGNOSIS — Z6841 Body Mass Index (BMI) 40.0 and over, adult: Secondary | ICD-10-CM | POA: Diagnosis not present

## 2024-05-25 DIAGNOSIS — E782 Mixed hyperlipidemia: Secondary | ICD-10-CM | POA: Diagnosis not present

## 2024-05-25 DIAGNOSIS — N189 Chronic kidney disease, unspecified: Secondary | ICD-10-CM | POA: Insufficient documentation

## 2024-05-25 DIAGNOSIS — I2510 Atherosclerotic heart disease of native coronary artery without angina pectoris: Secondary | ICD-10-CM | POA: Diagnosis present

## 2024-05-25 HISTORY — PX: CORONARY STENT INTERVENTION: CATH118234

## 2024-05-25 HISTORY — PX: LEFT HEART CATH AND CORONARY ANGIOGRAPHY: CATH118249

## 2024-05-25 MED ORDER — SODIUM CHLORIDE 0.9% FLUSH: 3.0000 mL | INTRAVENOUS | Status: DC | PRN

## 2024-05-25 MED ORDER — SODIUM CHLORIDE 0.9% FLUSH: 3.0000 mL | Freq: Two times a day (BID) | INTRAVENOUS | Status: DC

## 2024-05-25 MED ORDER — LABETALOL HCL 5 MG/ML IV SOLN
INTRAVENOUS | Status: AC
  Filled 2024-05-25: qty 4

## 2024-05-25 MED ORDER — SODIUM CHLORIDE 0.9 % IV SOLN: 250.0000 mL | INTRAVENOUS | Status: DC | PRN

## 2024-05-25 MED ORDER — HEPARIN SODIUM (PORCINE) 1000 UNIT/ML IJ SOLN
INTRAMUSCULAR | Status: AC
  Filled 2024-05-25: qty 10

## 2024-05-25 MED ORDER — HEPARIN SODIUM (PORCINE) 1000 UNIT/ML IJ SOLN
INTRAMUSCULAR | Status: DC | PRN
  Administered 2024-05-25: 8000 [IU] via INTRAVENOUS
  Administered 2024-05-25: 4000 [IU] via INTRAVENOUS
  Administered 2024-05-25: 8000 [IU] via INTRAVENOUS

## 2024-05-25 MED ORDER — LABETALOL HCL 5 MG/ML IV SOLN: 10.0000 mg | INTRAVENOUS | Status: DC | PRN

## 2024-05-25 MED ORDER — HYDRALAZINE HCL 20 MG/ML IJ SOLN
INTRAMUSCULAR | Status: DC | PRN
  Administered 2024-05-25: 10 mg via INTRAVENOUS

## 2024-05-25 MED ORDER — FENTANYL CITRATE (PF) 100 MCG/2ML IJ SOLN
INTRAMUSCULAR | Status: AC
  Filled 2024-05-25: qty 2

## 2024-05-25 MED ORDER — LABETALOL HCL 5 MG/ML IV SOLN
INTRAVENOUS | Status: DC | PRN
  Administered 2024-05-25 (×2): 10 mg via INTRAVENOUS

## 2024-05-25 MED ORDER — MIDAZOLAM HCL 2 MG/2ML IJ SOLN
INTRAMUSCULAR | Status: AC
  Filled 2024-05-25: qty 2

## 2024-05-25 MED ORDER — NITROGLYCERIN 1 MG/10 ML FOR IR/CATH LAB
INTRA_ARTERIAL | Status: AC
  Filled 2024-05-25: qty 10

## 2024-05-25 MED ORDER — MIDAZOLAM HCL (PF) 2 MG/2ML IJ SOLN
INTRAMUSCULAR | Status: DC | PRN
  Administered 2024-05-25: 1 mg via INTRAVENOUS
  Administered 2024-05-25: 2 mg via INTRAVENOUS

## 2024-05-25 MED ORDER — VERAPAMIL HCL 2.5 MG/ML IV SOLN
INTRAVENOUS | Status: AC
  Filled 2024-05-25: qty 2

## 2024-05-25 MED ORDER — HYDRALAZINE HCL 20 MG/ML IJ SOLN
INTRAMUSCULAR | Status: AC
  Filled 2024-05-25: qty 1

## 2024-05-25 MED ORDER — LIDOCAINE HCL (PF) 1 % IJ SOLN
INTRAMUSCULAR | Status: AC
  Filled 2024-05-25: qty 30

## 2024-05-25 MED ORDER — CLOPIDOGREL BISULFATE 300 MG PO TABS
ORAL_TABLET | ORAL | Status: DC | PRN
  Administered 2024-05-25: 600 mg via ORAL

## 2024-05-25 MED ORDER — PANTOPRAZOLE SODIUM 40 MG PO TBEC
40.0000 mg | DELAYED_RELEASE_TABLET | Freq: Every day | ORAL | 1 refills | Status: AC
  Filled 2024-05-25: qty 30, 30d supply, fill #0

## 2024-05-25 MED ORDER — VERAPAMIL HCL 2.5 MG/ML IV SOLN
INTRAVENOUS | Status: DC | PRN
  Administered 2024-05-25: 10 mL via INTRA_ARTERIAL

## 2024-05-25 MED ORDER — CLOPIDOGREL BISULFATE 75 MG PO TABS: 75.0000 mg | ORAL_TABLET | Freq: Every day | ORAL | Status: DC

## 2024-05-25 MED ORDER — FREE WATER: 500.0000 mL | Freq: Once | Status: DC

## 2024-05-25 MED ORDER — ACETAMINOPHEN 325 MG PO TABS: 650.0000 mg | ORAL_TABLET | ORAL | Status: DC | PRN

## 2024-05-25 MED ORDER — ASPIRIN 81 MG PO CHEW: 81.0000 mg | CHEWABLE_TABLET | ORAL | Status: DC

## 2024-05-25 MED ORDER — IOHEXOL 350 MG/ML SOLN
INTRAVENOUS | Status: DC | PRN
  Administered 2024-05-25: 160 mL

## 2024-05-25 MED ORDER — CLOPIDOGREL BISULFATE 75 MG PO TABS
75.0000 mg | ORAL_TABLET | Freq: Every day | ORAL | 2 refills | Status: AC
  Filled 2024-05-25: qty 90, 90d supply, fill #0

## 2024-05-25 MED ORDER — HYDRALAZINE HCL 20 MG/ML IJ SOLN: 10.0000 mg | INTRAMUSCULAR | Status: DC | PRN

## 2024-05-25 MED ORDER — ONDANSETRON HCL 4 MG/2ML IJ SOLN: 4.0000 mg | Freq: Four times a day (QID) | INTRAMUSCULAR | Status: DC | PRN

## 2024-05-25 MED ORDER — LIDOCAINE HCL (PF) 1 % IJ SOLN
INTRAMUSCULAR | Status: DC | PRN
  Administered 2024-05-25: 2 mL

## 2024-05-25 MED ORDER — ASPIRIN 81 MG PO CHEW: 81.0000 mg | CHEWABLE_TABLET | Freq: Every day | ORAL | Status: DC

## 2024-05-25 MED ORDER — FENTANYL CITRATE (PF) 100 MCG/2ML IJ SOLN
INTRAMUSCULAR | Status: DC | PRN
  Administered 2024-05-25 (×2): 25 ug via INTRAVENOUS

## 2024-05-25 MED ORDER — HEPARIN (PORCINE) IN NACL 1000-0.9 UT/500ML-% IV SOLN
INTRAVENOUS | Status: DC | PRN
  Administered 2024-05-25: 1000 mL via SURGICAL_CAVITY

## 2024-05-25 NOTE — Progress Notes (Signed)
 Discharge instructions reviewed with patient and wife Geni at the bedside. Denies questions and concnerns. TR Band removed no s/s of complications at the incision site. PT toelrated PO intake ambulated in the hallway. Was able to void without difficulty, and escorted from the unit via wheel cahir to personal vehicle.

## 2024-05-25 NOTE — Discharge Instructions (Addendum)
 Radial Site Care The following information offers guidance on how to care for yourself after your procedure. Your health care provider may also give you more specific instructions. If you have problems or questions, contact your health care provider. What can I expect after the procedure? After the procedure, it is common to have bruising and tenderness in the incision area. Follow these instructions at home: Incision site care  Follow instructions from your health care provider about how to take care of your incision site. Make sure you: Wash your hands with soap and water  for at least 20 seconds before and after you change your bandage (dressing). If soap and water  are not available, use hand sanitizer. Remove your dressing in 24 hours. Leave stitches (sutures), skin glue, or adhesive strips in place. These skin closures may need to stay in place for 2 weeks or longer. If adhesive strip edges start to loosen and curl up, you may trim the loose edges. Do not remove adhesive strips completely unless your health care provider tells you to do that. Do not take baths, swim, or use a hot tub for at least 1 week. You may shower 24 hours after the procedure or as told by your health care provider. Remove the dressing and gently wash the incision area with plain soap and water . Pat the area dry with a clean towel. Do not rub the site. That could cause bleeding. Do not apply powder or lotion to the site. Check your incision site every day for signs of infection. Check for: Redness, swelling, or pain. Fluid or blood. Warmth. Pus or a bad smell. Activity For 24 hours after the procedure, or as directed by your health care provider: Do not flex or bend the affected arm. Do not push or pull heavy objects with the affected arm. Do not operate machinery or power tools. Do not drive. You should not drive yourself home from the hospital or clinic if you go home during that time period. You may drive 24  hours after the procedure unless your health care provider tells you not to. Do not lift anything that is heavier than 10 lb (4.5 kg), or the limit that you are told, until your health care provider says that it is safe. Return to your normal activities as told by your health care provider. Ask your health care provider what activities are safe for you and when you can return to work. If you were given a sedative during the procedure, it can affect you for several hours. Do not drive or operate machinery until your health care provider says that it is safe. General instructions Take over-the-counter and prescription medicines only as told by your health care provider. If you will be going home right after the procedure, plan to have a responsible adult care for you for the time you are told. This is important. Keep all follow-up visits. This is important. Contact a health care provider if: You have a fever or chills. You have any of these signs of infection at your incision site: Redness, swelling, or pain. Fluid or blood. Warmth. Pus or a bad smell. Get help right away if: The incision area swells very fast. The incision area is bleeding, and the bleeding does not stop when you hold steady pressure on the area. Your arm or hand becomes pale, cool, tingly, or numb. These symptoms may represent a serious problem that is an emergency. Do not wait to see if the symptoms will go away. Get medical  help right away. Call your local emergency services (911 in the [place] Do not drive yourself to the hospital. Summary After the procedure, it is common to have bruising and tenderness at the incision site. Follow instructions from your health care provider about how to take care of your radial site incision. Check the incision every day for signs of infection. Do not lift anything that is heavier than 10 lb (4.5 kg), or the limit that you are told, until your health care provider says that it is  safe. Get help right away if the incision area swells very fast, you have bleeding at the incision site that will not stop, or your arm or hand becomes pale, cool, or numb. This information is not intended to replace advice given to you by your health care provider. Make sure you discuss any questions you have with your health care provider. Document Revised: 06/24/2020 Document Reviewed: 06/24/2020 Elsevier Patient Education  2024 [employer].  Drink plenty of fluids for 48 hours and keep wrist elevated at heart level for 24 hours  Radial Site Care   This sheet gives you information about how to care for yourself after your procedure. Your health care provider may also give you more specific instructions. If you have problems or questions, contact your health care provider. What can I expect after the procedure? After the procedure, it is common to have: Bruising and tenderness at the catheter insertion area. Follow these instructions at home: Medicines Take over-the-counter and prescription medicines only as told by your health care provider. Insertion site care Follow instructions from your health care provider about how to take care of your insertion site. Make sure you: Wash your hands with soap and water  before you change your bandage (dressing). If soap and water  are not available, use hand sanitizer. Remove your dressing as told by your health care provider. In 24 hours Check your insertion site every day for signs of infection. Check for: Redness, swelling, or pain. Fluid or blood. Pus or a bad smell. Warmth. Do not take baths, swim, or use a hot tub until your health care provider approves. You may shower 24-48 hours after the procedure, or as directed by your health care provider. Remove the dressing and gently wash the site with plain soap and water . Pat the area dry with a clean towel. Do not rub the site. That could cause bleeding. Do not apply powder or lotion to the  site. Activity   For 24 hours after the procedure, or as directed by your health care provider: Do not flex or bend the affected arm. Do not push or pull heavy objects with the affected arm. Do not drive yourself home from the hospital or clinic. You may drive 24 hours after the procedure unless your health care provider tells you not to. Do not operate machinery or power tools. Do not lift anything that is heavier than 10 lb (4.5 kg), or the limit that you are told, until your health care provider says that it is safe.  For 4 days Ask your health care provider when it is okay to: Return to work or school. Resume usual physical activities or sports. Resume sexual activity. General instructions If the catheter site starts to bleed, raise your arm and put firm pressure on the site. If the bleeding does not stop, get help right away. This is a medical emergency. If you went home on the same day as your procedure, a responsible adult should be with  you for the first 24 hours after you arrive home. Keep all follow-up visits as told by your health care provider. This is important. Contact a health care provider if: You have a fever. You have redness, swelling, or yellow drainage around your insertion site. Get help right away if: You have unusual pain at the radial site. The catheter insertion area swells very fast. The insertion area is bleeding, and the bleeding does not stop when you hold steady pressure on the area. Your arm or hand becomes pale, cool, tingly, or numb. These symptoms may represent a serious problem that is an emergency. Do not wait to see if the symptoms will go away. Get medical help right away. Call your local emergency services (911 in the [place] Do not drive yourself to the hospital. Summary After the procedure, it is common to have bruising and tenderness at the site. Follow instructions from your health care provider about how to take care of your radial site wound.  Check the wound every day for signs of infection. Do not lift anything that is heavier than 10 lb (4.5 kg), or the limit that you are told, until your health care provider says that it is safe. This information is not intended to replace advice given to you by your health care provider. Make sure you discuss any questions you have with your health care provider. Document Revised: 06/10/2017 Document Reviewed: 06/10/2017 Elsevier Patient Education  2020 [employer].

## 2024-05-25 NOTE — Interval H&P Note (Signed)
 History and Physical Interval Note:  05/25/2024 1:15 PM  Charles Farley  has presented today for surgery, with the diagnosis of cad - angina.  The various methods of treatment have been discussed with the patient and family. After consideration of risks, benefits and other options for treatment, the patient has consented to  Procedures: LEFT HEART CATH AND CORONARY ANGIOGRAPHY (N/A)  PERCUTANEOUS CORONARY INTERVENTION  as a surgical intervention.  The patient's history has been reviewed, patient examined, no change in status, stable for surgery.  I have reviewed the patient's chart and labs.  Questions were answered to the patient's satisfaction.    Cath Lab Visit (complete for each Cath Lab visit)  Clinical Evaluation Leading to the Procedure:   ACS: No.  Non-ACS:    Anginal Classification: CCS II  Anti-ischemic medical therapy: Minimal Therapy (1 class of medications)  Non-Invasive Test Results: Intermediate-risk stress test findings: cardiac mortality 1-3%/year  Prior CABG: No previous CABG    Alm Clay

## 2024-05-25 NOTE — Discharge Summary (Signed)
 "   Discharge Summary for Same Day PCI   Patient ID: Charles Farley MRN: [id number]; DOB: [birth year]  Admit date: 05/25/2024 Discharge date: 05/25/2024  Primary Care Provider: Meredeth Prentice KIDD, MD  Primary Cardiologist: Redell Leiter, MD  Primary Electrophysiologist:  None   Discharge Diagnoses    Active Problems:   CAD (coronary artery disease)    Diagnostic Studies/Procedures    Cardiac Catheterization 05/25/2024:    Prox RCA lesion is 80% stenosed.   A drug-eluting stent was successfully placed using a STENT SYNERGY XD 3.0X24 => postdilated to 3.6 mm.  Post intervention, there is a 0% residual stenosis.   Small, diminutive LAD after D2.   ---------------------------------------------------------   The left ventricular systolic function is normal.  The left ventricular ejection fraction is 55-65% by visual estimate.  LV end diastolic pressure is mildly elevated. There is no aortic valve stenosis.   ---------------------------------------------------------   Diagnostic - Dominance: Right                                                  Intervention    Single-vessel CAD with 80% proximal and mid RCA stenosis and otherwise minimal disease in the rest of the RCA and LCA system with diminutive LAD after large D1 and D2. Successful RCA PCI with Synergy XD 3.0 x 24 mm postdilated to 3.6 mm with TIMI-3 flow preserved Normal LVEF with EDP of 20 to 21 mmHg with systemic hypertension systolic pressures in the 200s.      RECOMMENDATIONS   In the absence of any other complications or medical issues, we expect the patient to be ready for discharge from an interventional cardiology perspective on 05/25/2024.   Recommend uninterrupted dual antiplatelet therapy with Aspirin  81mg  daily and Clopidogrel  75mg  daily for a minimum of 6 months (stable ischemic heart disease-Class I recommendation).   Would continue DAPT for 6 months and then discontinue aspirin , continue Plavix  for minimum 1 year.  Alm Clay, MD _____________   History of Present Illness     Charles Farley is a 54 y.o. male with PMH of HTN, HLD and CVA who is followed by Dr. Leiter as an outpatient. He had cardiac CTA 02/18/2024 with a calcium  score of 27, 50th percentile and stenosis felt to be severe in the proximal right coronary artery with mRCA FFR 0.82 and dRCA 0.76. He was recommended for medical therapy with aspirin , beta-blocker, amlodipine  and ARB. He was seen back in the office on 12/17 and reported ongoing anginal symptoms despite optimal medical therapy and therefore was set up for outpatient cardiac cath.   Hospital Course     The patient underwent cardiac cath as noted above with 80% pRCA treated with PCI/DES x1. Plan for DAPT with ASA/plavix  for at least 6 months. The patient was seen by cardiac rehab while in short stay. There were no observed complications post cath. Radial cath site was re-evaluated prior to discharge and found to be stable without any complications. Instructions/precautions regarding cath site care were given prior to discharge.  Charles Farley was seen by Dr. Clay and determined stable for discharge home. Follow up with our office has been arranged. Medications are listed below. Pertinent changes include addition of plavix . Discussed with patient regarding increased risk of bleeding with the use of mobic  with DAPT. He will try to limit  his use of mobic  but struggles with joint pain when not using, discussed adding protonix  for increased GI protection.   _____________  Cath/PCI Registry Performance & Quality Measures: Aspirin  prescribed? - Yes ADP Receptor Inhibitor (Plavix /Clopidogrel , Brilinta/Ticagrelor or Effient/Prasugrel) prescribed (includes medically managed patients)? - Yes High Intensity Statin (Lipitor 40-80mg  or Crestor  20-40mg ) prescribed? - No - on nexletol , fenofibrate   For EF <40%, was ACEI/ARB prescribed? - Not Applicable (EF >/= 40%) For EF <40%, Aldosterone Antagonist  (Spironolactone or Eplerenone ) prescribed? - Not Applicable (EF >/= 40%) Cardiac Rehab Phase II ordered (Included Medically managed Patients)? - Yes  _____________   Discharge Vitals Blood pressure (!) 115/55, pulse 70, temperature 98.5 F (36.9 C), temperature source Oral, resp. rate 15, height 6' (1.829 m), weight (!) 158.8 kg, SpO2 96%.  Filed Weights   05/25/24 0950  Weight: (!) 158.8 kg    Last Labs & Radiologic Studies    CBC Recent Labs    05/24/24 1519  WBC 9.7  NEUTROABS 6.9  HGB 14.1  HCT 40.0  MCV 84  PLT 321   Basic Metabolic Panel Recent Labs    98/93/73 1519  NA 140  K 3.8  CL 101  CO2 26  GLUCOSE 108*  BUN 12  CREATININE 0.97  CALCIUM  9.1   Liver Function Tests Recent Labs    05/24/24 1519  AST 17  ALT 19  ALKPHOS 60  BILITOT 0.7  PROT 6.3  ALBUMIN 4.4   No results for input(s): LIPASE, AMYLASE in the last 72 hours. High Sensitivity Troponin:   No results for input(s): TROPONINIHS in the last 720 hours.  BNP Invalid input(s): POCBNP D-Dimer No results for input(s): DDIMER in the last 72 hours. Hemoglobin A1C No results for input(s): HGBA1C in the last 72 hours. Fasting Lipid Panel No results for input(s): CHOL, HDL, LDLCALC, TRIG, CHOLHDL, LDLDIRECT in the last 72 hours. Thyroid  Function Tests No results for input(s): TSH, T4TOTAL, T3FREE, THYROIDAB in the last 72 hours.  Invalid input(s): FREET3 _____________  CARDIAC CATHETERIZATION Result Date: 05/25/2024 Images from the original result were not included.   Prox RCA lesion is 80% stenosed.   A drug-eluting stent was successfully placed using a STENT SYNERGY XD 3.0X24 => postdilated to 3.6 mm.  Post intervention, there is a 0% residual stenosis.   Small, diminutive LAD after D2.   ---------------------------------------------------------   The left ventricular systolic function is normal.  The left ventricular ejection fraction is 55-65% by visual  estimate.  LV end diastolic pressure is mildly elevated. There is no aortic valve stenosis.   --------------------------------------------------------- Diagnostic - Dominance: Right     Intervention Single-vessel CAD with 80% proximal and mid RCA stenosis and otherwise minimal disease in the rest of the RCA and LCA system with diminutive LAD after large D1 and D2. Successful RCA PCI with Synergy XD 3.0 x 24 mm postdilated to 3.6 mm with TIMI-3 flow preserved Normal LVEF with EDP of 20 to 21 mmHg with systemic hypertension systolic pressures in the 200s. RECOMMENDATIONS   In the absence of any other complications or medical issues, we expect the patient to be ready for discharge from an interventional cardiology perspective on 05/25/2024.   Recommend uninterrupted dual antiplatelet therapy with Aspirin  81mg  daily and Clopidogrel  75mg  daily for a minimum of 6 months (stable ischemic heart disease-Class I recommendation).   Would continue DAPT for 6 months and then discontinue aspirin , continue Plavix  for minimum 1 year. SABRA Alm Clay, MD   Disposition  Pt is being discharged home today in good condition.  Follow-up Plans & Appointments     Discharge Instructions     AMB Referral to Cardiac Rehabilitation - Phase II   Complete by: As directed    Diagnosis: Coronary Stents   After initial evaluation and assessments completed: Virtual Based Care may be provided alone or in conjunction with Phase 2 Cardiac Rehab based on patient barriers.: Yes   Intensive Cardiac Rehabilitation (ICR) [facility] location only OR Traditional Cardiac Rehabilitation (TCR) *If criteria for ICR are not met will enroll in TCR [facility] only): Yes        Discharge Medications   Allergies as of 05/25/2024   No Known Allergies      Medication List     TAKE these medications    amLODipine  10 MG tablet Commonly known as: NORVASC  Take 1 tablet (10 mg total) by mouth daily.   aspirin  EC 81 MG tablet Take 1 tablet (81 mg  total) by mouth daily.   clopidogrel  75 MG tablet Commonly known as: Plavix  Take 1 tablet (75 mg total) by mouth daily.   eplerenone  50 MG tablet Commonly known as: Inspra  Take 1 tablet (50 mg total) by mouth daily.   fenofibrate  54 MG tablet Take 1 tablet (54 mg total) by mouth daily.   irbesartan  300 MG tablet Commonly known as: AVAPRO  Take 1 tablet (300 mg total) by mouth daily.   isosorbide  mononitrate 30 MG 24 hr tablet Commonly known as: IMDUR  Take 1 tablet (30 mg total) by mouth daily.   meloxicam  7.5 MG tablet Commonly known as: MOBIC  Take 1-2 tablets (7.5-15 mg total) by mouth daily as needed.   Nexletol  180 MG Tabs Generic drug: Bempedoic Acid  Take 1 tablet (180 mg total) by mouth daily.   nitroGLYCERIN  0.4 MG SL tablet Commonly known as: NITROSTAT  Place 1 tablet (0.4 mg total) under the tongue every 5 (five) minutes as needed for chest pain.   pantoprazole  40 MG tablet Commonly known as: Protonix  Take 1 tablet (40 mg total) by mouth daily.   semaglutide -weight management 1.7 MG/0.75ML Soaj SQ injection Commonly known as: WEGOVY  Inject 1.7 mg into the skin once a week for 28 days.   semaglutide -weight management 2.4 MG/0.75ML Soaj SQ injection Commonly known as: WEGOVY  Inject 2.4 mg into the skin once a week for 28 days. Start taking on: June 13, 2024   tamsulosin  0.4 MG Caps capsule Commonly known as: FLOMAX  Take 1 capsule (0.4 mg total) by mouth daily.           Allergies Allergies[1]  Outstanding Labs/Studies   N/a   Duration of Discharge Encounter   Greater than 30 minutes including physician time.  Signed, Manuelita Rummer, NP 05/25/2024, 4:48 PM     [1] No Known Allergies  "

## 2024-05-26 ENCOUNTER — Encounter (HOSPITAL_COMMUNITY): Payer: Self-pay | Admitting: Cardiology

## 2024-05-26 LAB — POCT ACTIVATED CLOTTING TIME
Activated Clotting Time: 240 s
Activated Clotting Time: 301 s

## 2024-05-27 ENCOUNTER — Telehealth (HOSPITAL_COMMUNITY): Payer: Self-pay

## 2024-05-27 DIAGNOSIS — Z955 Presence of coronary angioplasty implant and graft: Secondary | ICD-10-CM

## 2024-05-27 NOTE — Telephone Encounter (Signed)
 CARDIAC REHAB PHASE I    Tried to reach patient x 2 with post stent education. Left voicemail detailing education including site care, restrictions, adherence to antiplatelet medications, and importance of lifestyle modification and CRP2.Will refer to [facility] for CRP2.   Isaiah JAYSON Liverpool, RN BSN 05/27/2024 12:47 PM

## 2024-06-05 ENCOUNTER — Other Ambulatory Visit (HOSPITAL_COMMUNITY): Payer: Self-pay

## 2024-06-05 MED ORDER — ASPIRIN 81 MG PO TBEC
81.0000 mg | DELAYED_RELEASE_TABLET | Freq: Every day | ORAL | 3 refills | Status: AC
  Filled 2024-06-05: qty 90, 90d supply, fill #0

## 2024-06-05 MED ORDER — ROSUVASTATIN CALCIUM 5 MG PO TABS
5.0000 mg | ORAL_TABLET | Freq: Every day | ORAL | 3 refills | Status: AC
  Filled 2024-06-05: qty 90, 90d supply, fill #0

## 2024-06-05 MED ORDER — AMLODIPINE BESYLATE 10 MG PO TABS
10.0000 mg | ORAL_TABLET | Freq: Every day | ORAL | 3 refills | Status: DC
  Filled 2024-06-05: qty 90, 90d supply, fill #0

## 2024-06-05 MED ORDER — FENOFIBRATE 54 MG PO TABS
54.0000 mg | ORAL_TABLET | Freq: Every day | ORAL | 3 refills | Status: DC
  Filled 2024-06-05: qty 90, 90d supply, fill #0

## 2024-06-06 ENCOUNTER — Other Ambulatory Visit: Payer: Self-pay

## 2024-06-06 NOTE — Progress Notes (Unsigned)
 " Cardiology Office Note:    Date:  06/08/2024   ID:  Charles Farley, DOB 11/25/1970, MRN [id number]  PCP:  Meredeth Prentice KIDD, MD  Cardiologist:  Redell Leiter, MD    Referring MD: Meredeth Prentice KIDD, MD    ASSESSMENT:    1. Coronary artery disease of native artery of native heart with stable angina pectoris   2. Primary hypertension   3. Mixed hyperlipidemia    PLAN:    In order of problems listed above:  After PCI continue dual antiplatelet therapy 1 year and combined lipid-lowering therapy statin fenofibrate  Calcium  channel blocker MRA ARB Current combined treatment check profile today   Next appointment: 9 months   Medication Adjustments/Labs and Tests Ordered: Current medicines are reviewed at length with the patient today.  Concerns regarding medicines are outlined above.  Orders Placed This Encounter  Procedures   Comprehensive metabolic panel with GFR   Lipid Panel+ApoB   Lipoprotein A (LPA)   No orders of the defined types were placed in this encounter.    History of Present Illness:    Charles Farley is a 54 y.o. male with a hx of CAD hypertension hyperlipidemia and obesity last seen 05/04/2024.  He had a recent PCI of the right coronary artery 05/25/2024.  Compliance with diet, lifestyle and medications: Yes  Quite improved no anginal discomfort post PCI Tolerates lipid-lowering therapy today check profile CMP LPA ApoB Blood pressure markedly improved at target Past Medical History:  Diagnosis Date   ADHD    Arteriosclerotic cardiovascular disease (ASCVD) 05/03/2024   Chronic kidney disease due to hypertension 05/03/2024   History of stroke without residual deficits 05/03/2024   HLD (hyperlipidemia)    Hypertension    Lower urinary tract symptoms due to benign prostatic hyperplasia 05/03/2024   Passive suicidal ideations 02/12/2020   Persistent adjustment disorder 02/12/2020   Stroke (HCC)    Subcortical infarction (HCC) 03/03/2022    Current  Medications: Active Medications[1]    EKGs/Labs/Other Studies Reviewed:    The following studies were reviewed today:  Cardiac Studies & Procedures   ______________________________________________________________________________________________ CARDIAC CATHETERIZATION  CARDIAC CATHETERIZATION 05/25/2024  Conclusion Images from the original result were not included.    Prox RCA lesion is 80% stenosed.   A drug-eluting stent was successfully placed using a STENT SYNERGY XD 3.0X24 => postdilated to 3.6 mm.  Post intervention, there is a 0% residual stenosis.   Small, diminutive LAD after D2.   ---------------------------------------------------------   The left ventricular systolic function is normal.  The left ventricular ejection fraction is 55-65% by visual estimate.  LV end diastolic pressure is mildly elevated. There is no aortic valve stenosis.   ---------------------------------------------------------  Diagnostic - Dominance: Right     Intervention   Single-vessel CAD with 80% proximal and mid RCA stenosis and otherwise minimal disease in the rest of the RCA and LCA system with diminutive LAD after large D1 and D2. Successful RCA PCI with Synergy XD 3.0 x 24 mm postdilated to 3.6 mm with TIMI-3 flow preserved Normal LVEF with EDP of 20 to 21 mmHg with systemic hypertension systolic pressures in the 200s.   RECOMMENDATIONS   In the absence of any other complications or medical issues, we expect the patient to be ready for discharge from an interventional cardiology perspective on 05/25/2024.   Recommend uninterrupted dual antiplatelet therapy with Aspirin  81mg  daily and Clopidogrel  75mg  daily for a minimum of 6 months (stable ischemic heart disease-Class I recommendation).   Would  continue DAPT for 6 months and then discontinue aspirin , continue Plavix  for minimum 1 year. SABRA Alm Clay, MD  Findings Coronary Findings Diagnostic  Dominance: Right  Left Main Vessel was  injected. Vessel is moderate in size.  Left Anterior Descending The LAD beyond D2 is smaller than D2 very diminutive. Mid LAD to Dist LAD lesion is 35% stenosed.  Second Diagonal Branch Vessel is large in size.  Left Circumflex Vessel is moderate in size.  Second Obtuse Marginal Branch Vessel is moderate in size.  Left Posterior Atrioventricular Artery Vessel is small in size.  Right Coronary Artery Vessel was injected. Vessel is large. High anterior takeoff Prox RCA lesion is 80% stenosed. Vessel is the culprit lesion. The lesion is type A, segmental and concentric.  Right Posterior Atrioventricular Artery Vessel is moderate in size.  First Right Posterolateral Branch Vessel is small in size.  Second Right Posterolateral Branch  Intervention  Prox RCA lesion Stent Lesion length:  22 mm. CATH LAUNCHER 6FR AL.75 guide catheter was inserted. Lesion crossed with guidewire using a WIRE ASAHI PROWATER 180CM. Pre-stent angioplasty was performed using a BALLOON EMERGE MR 2.5X15. Maximum pressure:  12 atm. Inflation time: 20 sec. A drug-eluting stent was successfully placed using a STENT SYNERGY XD 3.0X24. Maximum pressure: 16 atm. Inflation time: 30 sec. Stent strut is well apposed. Postdilated to 3.6 mm Post-stent angioplasty was performed using a BALLOON [place] EMERGE MR 3.5X15. Maximum pressure:  16 atm. Inflation time:  20 sec. 3 inflations distal, mid and proximal Post-Intervention Lesion Assessment The intervention was successful. Pre-interventional TIMI flow is 3. Post-intervention TIMI flow is 3. Treated lesion length:  24 mm. No complications occurred at this lesion. There is a 0% residual stenosis post intervention.     ECHOCARDIOGRAM  ECHOCARDIOGRAM COMPLETE 03/03/2022  Narrative ECHOCARDIOGRAM REPORT    Patient Name:   Charles Farley Date of Exam: 03/03/2022 Medical Rec #:  [id number]     Height:       72.0 in Accession #:    [id number]    Weight:       315.0  lb Date of Birth:  02-May-1971     BSA:          2.584 m Patient Age:    51 years      BP:           176/89 mmHg Patient Gender: M             HR:           69 bpm. Exam Location:  Inpatient  Procedure: 2D Echo, Cardiac Doppler, Color Doppler and Intracardiac Opacification Agent  Indications:    Stroke I63.9  History:        Patient has no prior history of Echocardiogram examinations. Risk Factors:Hypertension, Dyslipidemia and Non-Smoker.  Sonographer:    Madeline Finder Referring Phys: [phone] JARED M GARDNER   Sonographer Comments: Technically difficult study due to poor echo windows, no subcostal window and patient is obese. Image acquisition challenging due to respiratory motion. IMPRESSIONS   1. Left ventricular ejection fraction, by estimation, is 60 to 65%. The left ventricle has normal function. The left ventricle has no regional wall motion abnormalities. There is mild concentric left ventricular hypertrophy. Left ventricular diastolic parameters are consistent with Grade I diastolic dysfunction (impaired relaxation). 2. Right ventricular systolic function is normal. The right ventricular size is normal. Tricuspid regurgitation signal is inadequate for assessing PA pressure. 3. The mitral valve is normal in  structure. Trivial mitral valve regurgitation. No evidence of mitral stenosis. 4. The aortic valve is tricuspid. Aortic valve regurgitation is mild. No aortic stenosis is present. 5. The inferior vena cava is normal in size with greater than 50% respiratory variability, suggesting right atrial pressure of 3 mmHg.  Comparison(s): No prior Echocardiogram.  Conclusion(s)/Recommendation(s): No intracardiac source of embolism detected on this transthoracic study. Consider a transesophageal echocardiogram to exclude cardiac source of embolism if clinically indicated.  FINDINGS Left Ventricle: Left ventricular ejection fraction, by estimation, is 60 to 65%. The left ventricle has  normal function. The left ventricle has no regional wall motion abnormalities. Definity  contrast agent was given IV to delineate the left ventricular endocardial borders. The left ventricular internal cavity size was normal in size. There is mild concentric left ventricular hypertrophy. Left ventricular diastolic parameters are consistent with Grade I diastolic dysfunction (impaired relaxation).  Right Ventricle: The right ventricular size is normal. No increase in right ventricular wall thickness. Right ventricular systolic function is normal. Tricuspid regurgitation signal is inadequate for assessing PA pressure.  Left Atrium: Left atrial size was normal in size.  Right Atrium: Right atrial size was normal in size.  Pericardium: There is no evidence of pericardial effusion.  Mitral Valve: The mitral valve is normal in structure. Trivial mitral valve regurgitation. No evidence of mitral valve stenosis.  Tricuspid Valve: The tricuspid valve is normal in structure. Tricuspid valve regurgitation is trivial.  Aortic Valve: The aortic valve is tricuspid. Aortic valve regurgitation is mild. No aortic stenosis is present.  Pulmonic Valve: The pulmonic valve was normal in structure. Pulmonic valve regurgitation is trivial.  Aorta: The aortic root and ascending aorta are structurally normal, with no evidence of dilitation.  Venous: The inferior vena cava is normal in size with greater than 50% respiratory variability, suggesting right atrial pressure of 3 mmHg.  IAS/Shunts: The atrial septum is grossly normal.   LEFT VENTRICLE PLAX 2D LVIDd:         5.10 cm   Diastology LVIDs:         4.00 cm   LV e' medial:    5.26 cm/s LV PW:         1.00 cm   LV E/e' medial:  13.5 LV IVS:        1.00 cm   LV e' lateral:   9.90 cm/s LVOT diam:     2.20 cm   LV E/e' lateral: 7.2 LV SV:         81 LV SV Index:   31 LVOT Area:     3.80 cm   RIGHT VENTRICLE RV S prime:     19.60 cm/s TAPSE (M-mode):  2.6 cm  LEFT ATRIUM             Index        RIGHT ATRIUM           Index LA diam:        3.80 cm 1.47 cm/m   RA Area:     17.20 cm LA Vol (A2C):   42.9 ml 16.60 ml/m  RA Volume:   44.60 ml  17.26 ml/m LA Vol (A4C):   46.2 ml 17.88 ml/m LA Biplane Vol: 45.1 ml 17.45 ml/m AORTIC VALVE LVOT Vmax:   111.00 cm/s LVOT Vmean:  71.800 cm/s LVOT VTI:    0.212 m  AORTA Ao Root diam: 3.60 cm Ao Asc diam:  3.40 cm  MITRAL VALVE MV Area (PHT): 2.87 cm  SHUNTS MV Decel Time: 264 msec    Systemic VTI:  0.21 m MV E velocity: 71.00 cm/s  Systemic Diam: 2.20 cm MV A velocity: 97.80 cm/s MV E/A ratio:  0.73  Powell Sorrow MD Electronically signed by Powell Sorrow MD Signature Date/Time: 03/03/2022/11:44:33 AM    Final      CT SCANS  CT CORONARY MORPH W/CTA COR W/SCORE 02/18/2024  Addendum 02/27/2024  4:52 PM ADDENDUM REPORT: 02/27/2024 16:50  EXAM: OVER-READ INTERPRETATION  CT CHEST  The following report is an over-read performed by radiologist Dr. Andrea Gasman of [facility] on 02/27/2024. This over-read does not include interpretation of cardiac or coronary anatomy or pathology. The coronary CTA interpretation by the cardiologist is attached.  COMPARISON:  None.  FINDINGS: Vascular: Mild aortic atherosclerosis. The included aorta is normal in caliber.  Mediastinum/nodes: No adenopathy or mass. Unremarkable esophagus.  Lungs: No focal airspace disease. 7 mm subpleural left lower lobe nodule, series 309, image 129. Question of subpleural right lower lobe nodule, series 39, image 122, partially obscured by adjacent atelectasis. No pleural fluid. The included airways are patent.  Upper abdomen: No acute or unexpected findings.  Musculoskeletal: There are no acute or suspicious osseous abnormalities. Thoracic spondylosis.  IMPRESSION: 1.  Aortic Atherosclerosis (ICD10-I70.0). 2. A 7 mm subpleural left lower lobe nodule. Question of  subpleural right lower lobe nodule, partially obscured by adjacent atelectasis. Non-contrast chest CT at 3-6 months is recommended. If the nodules are stable at time of repeat CT, then future CT at 18-24 months (from today's scan) is considered optional for low-risk patients, but is recommended for high-risk patients. This recommendation follows the consensus statement: Guidelines for Management of Incidental Pulmonary Nodules Detected on CT Images: From the Fleischner Society 2017; Radiology 2017; 284:228-243.   Electronically Signed By: Andrea Gasman M.D. On: 02/27/2024 16:50  Narrative CLINICAL DATA:  CP  EXAM: Cardiac/Coronary  CTA  TECHNIQUE: The patient was scanned on a GE Apex scanner.  FINDINGS: A 120 kV prospective scan was triggered in the descending thoracic aorta at 111 HU's. Axial non-contrast 3 mm slices were carried out through the heart. The data set was analyzed on a dedicated work station and scored using the Agatson method. Gantry rotation speed was 250 msecs and collimation was .6 mm. No beta blockade and 0.8 mg of sl NTG was given. The 3D data set was reconstructed at 77% of the R-R cycle. Diastolic phases were analyzed on a dedicated work station using MPR, MIP and VRT modes. The patient received 80 cc of contrast.  Aorta: Normal size.  No calcifications.  No dissection.  Aortic Valve:  Trileaflet.  No calcifications.  Coronary Arteries:  Normal coronary origin.  Right dominance.  RCA is a large dominant artery that gives rise to PDA and PLA. There is mixed plaque in the proximal portion of this artery with severe stenosis of 70-99%.  Left main is a large artery that gives rise to LAD and LCX arteries.  LAD is a large vessel that has two sift plaques in its mid portion with mild stenosis of 25-49%. This artery gives rise to moderate size D1 and large D2. D1 has few soft plaques with mild stenosis of 25-49%. D2 has soft plaque in its proximal  portion with moderate stenosis of 50-69%.  LCX is a non-dominant artery that gives rise to one large OM1 branch. There is soft plaque in the mid portion of CX with mild stenosis. In the mid portion of OM2 there  is soft plaque with mild stenosis.  Other findings:  Normal pulmonary vein drainage into the left atrium. Rt middle pulmonary vein noted - normal variant.  Normal left atrial appendage without a thrombus.  Normal size of the pulmonary artery.  IMPRESSION: 1. Coronary calcium  score of 27. This was 50 percentile for age and sex matched control.  2. Normal coronary origin with right dominance.  3. CAD-RADS 4 Severe stenosis. (70-99%) prox RCA. Cardiac catheterization or CT FFR is recommended. Consider symptom-guided anti-ischemic pharmacotherapy as well as risk factor modification per guideline directed care.  Electronically Signed: By: Lamar Fitch M.D. On: 02/18/2024 12:57     ______________________________________________________________________________________________          Recent Labs: 01/22/2024: NT-Pro BNP 88 05/24/2024: ALT 19; BUN 12; Creatinine, Ser 0.97; Hemoglobin 14.1; Platelets 321; Potassium 3.8; Sodium 140  Recent Lipid Panel    Component Value Date/Time   CHOL 223 (H) 03/01/2022 2013   TRIG 232 (H) 03/01/2022 2013   HDL 35 (L) 03/01/2022 2013   CHOLHDL 6.4 03/01/2022 2013   VLDL 46 (H) 03/01/2022 2013   LDLCALC 142 (H) 03/01/2022 2013    Physical Exam:    VS:  BP 134/62   Pulse 93   SpO2 97%     Wt Readings from Last 3 Encounters:  05/25/24 (!) 350 lb (158.8 kg)  05/04/24 (!) 352 lb 9.6 oz (159.9 kg)  03/18/22 (!) 318 lb (144.2 kg)     GEN:  Well nourished, well developed in no acute distress HEENT: Normal NECK: No JVD; No carotid bruits LYMPHATICS: No lymphadenopathy CARDIAC: RRR, no murmurs, rubs, gallops RESPIRATORY:  Clear to auscultation without rales, wheezing or rhonchi  ABDOMEN: Soft, non-tender,  non-distended MUSCULOSKELETAL:  No edema; No deformity  SKIN: Warm and dry NEUROLOGIC:  Alert and oriented x 3 PSYCHIATRIC:  Normal affect    Signed, Redell Leiter, MD  06/08/2024 3:33 PM    [facility]      [1]  Current Meds  Medication Sig   amLODipine  (NORVASC ) 10 MG tablet Take 1 tablet (10 mg total) by mouth daily.   aspirin  EC (ASPIRIN  81) 81 MG tablet Take 1 tablet (81 mg total) by mouth daily.   aspirin  EC 81 MG tablet Take 1 tablet (81 mg total) by mouth daily.   clopidogrel  (PLAVIX ) 75 MG tablet Take 1 tablet (75 mg total) by mouth daily.   eplerenone  (INSPRA ) 50 MG tablet Take 1 tablet (50 mg total) by mouth daily.   fenofibrate  54 MG tablet Take 1 tablet (54 mg total) by mouth daily.   irbesartan  (AVAPRO ) 300 MG tablet Take 1 tablet (300 mg total) by mouth daily.   meloxicam  (MOBIC ) 7.5 MG tablet Take 1-2 tablets (7.5-15 mg total) by mouth daily as needed.   nitroGLYCERIN  (NITROSTAT ) 0.4 MG SL tablet Place 1 tablet (0.4 mg total) under the tongue every 5 (five) minutes as needed for chest pain.   pantoprazole  (PROTONIX ) 40 MG tablet Take 1 tablet (40 mg total) by mouth daily.   rosuvastatin  (CRESTOR ) 5 MG tablet Take 1 tablet (5 mg total) by mouth daily.   semaglutide -weight management (WEGOVY ) 1.7 MG/0.75ML SOAJ SQ injection Inject 1.7 mg into the skin once a week for 28 days.   [START ON 06/13/2024] semaglutide -weight management (WEGOVY ) 2.4 MG/0.75ML SOAJ SQ injection Inject 2.4 mg into the skin once a week for 28 days.   tamsulosin  (FLOMAX ) 0.4 MG CAPS capsule Take 1 capsule (0.4 mg total) by mouth  daily.   [DISCONTINUED] amLODipine  (NORVASC ) 10 MG tablet Take 1 tablet (10 mg total) by mouth daily.   [DISCONTINUED] Bempedoic Acid  (NEXLETOL ) 180 MG TABS Take 1 tablet (180 mg total) by mouth daily.   [DISCONTINUED] fenofibrate  54 MG tablet Take 1 tablet (54 mg total) by mouth daily.   [DISCONTINUED] isosorbide  mononitrate (IMDUR ) 30 MG 24 hr tablet  Take 1 tablet (30 mg total) by mouth daily.   "

## 2024-06-08 ENCOUNTER — Encounter: Payer: Self-pay | Admitting: Cardiology

## 2024-06-08 ENCOUNTER — Ambulatory Visit: Attending: Cardiology | Admitting: Cardiology

## 2024-06-08 VITALS — BP 134/62 | HR 93

## 2024-06-08 DIAGNOSIS — E782 Mixed hyperlipidemia: Secondary | ICD-10-CM | POA: Diagnosis not present

## 2024-06-08 DIAGNOSIS — I25118 Atherosclerotic heart disease of native coronary artery with other forms of angina pectoris: Secondary | ICD-10-CM

## 2024-06-08 DIAGNOSIS — I10 Essential (primary) hypertension: Secondary | ICD-10-CM

## 2024-06-08 NOTE — Patient Instructions (Signed)
" ° ° °   ° °  ° °  Medication Instructions:  Your physician recommends that you continue on your current medications as directed. Please refer to the Current Medication list given to you today.  *If you need a refill on your cardiac medications before your next appointment, please call your pharmacy*   Lab Work: Your physician recommends that you have a CMP, ApoB-lipids and Lp(a) today in the office.  If you have labs (blood work) drawn today and your tests are completely normal, you will receive your results only by: MyChart Message (if you have MyChart) OR A paper copy in the mail If you have any lab test that is abnormal or we need to change your treatment, we will call you to review the results.   Testing/Procedures: None ordered   Follow-Up: At [facility], you and your health needs are our priority.  As part of our continuing mission to provide you with exceptional heart care, we have created designated Provider Care Teams.  These Care Teams include your primary Cardiologist (physician) and Advanced Practice Providers (APPs -  Physician Assistants and Nurse Practitioners) who all work together to provide you with the care you need, when you need it.  We recommend signing up for the patient portal called MyChart.  Sign up information is provided on this After Visit Summary.  MyChart is used to connect with patients for Virtual Visits (Telemedicine).  Patients are able to view lab/test results, encounter notes, upcoming appointments, etc.  Non-urgent messages can be sent to your provider as well.   To learn more about what you can do with MyChart, go to [email or web address].    Your next appointment:   9 month(s)  The format for your next appointment:   In Person  Provider:   Redell Leiter, MD    Other Instructions none  Important Information About Sugar       "

## 2024-06-10 ENCOUNTER — Ambulatory Visit: Payer: Self-pay | Admitting: Cardiology

## 2024-06-10 LAB — COMPREHENSIVE METABOLIC PANEL WITH GFR
ALT: 17 IU/L (ref 0–44)
AST: 13 IU/L (ref 0–40)
Albumin: 4.5 g/dL (ref 3.8–4.9)
Alkaline Phosphatase: 57 IU/L (ref 47–123)
BUN/Creatinine Ratio: 14 (ref 9–20)
BUN: 15 mg/dL (ref 6–24)
Bilirubin Total: 0.5 mg/dL (ref 0.0–1.2)
CO2: 24 mmol/L (ref 20–29)
Calcium: 9.4 mg/dL (ref 8.7–10.2)
Chloride: 102 mmol/L (ref 96–106)
Creatinine, Ser: 1.09 mg/dL (ref 0.76–1.27)
Globulin, Total: 2 g/dL (ref 1.5–4.5)
Glucose: 105 mg/dL — ABNORMAL HIGH (ref 70–99)
Potassium: 4.4 mmol/L (ref 3.5–5.2)
Sodium: 142 mmol/L (ref 134–144)
Total Protein: 6.5 g/dL (ref 6.0–8.5)
eGFR: 82 mL/min/1.73

## 2024-06-10 LAB — LIPID PANEL+APOB
Apolipoprotein B: 116 mg/dL — ABNORMAL HIGH
Cholesterol, Total: 201 mg/dL — ABNORMAL HIGH (ref 100–199)
HDL-C: 35 mg/dL — ABNORMAL LOW
LDL-C (NIH Calc): 139 mg/dL — ABNORMAL HIGH (ref 0–99)
Non-HDL Cholesterol: 166 mg/dL — ABNORMAL HIGH (ref 0–129)
Triglycerides: 146 mg/dL (ref 0–149)

## 2024-06-10 LAB — LIPOPROTEIN A (LPA): Lipoprotein (a): <8.4 nmol/L

## 2024-06-22 ENCOUNTER — Other Ambulatory Visit (HOSPITAL_BASED_OUTPATIENT_CLINIC_OR_DEPARTMENT_OTHER): Payer: Self-pay | Admitting: Internal Medicine

## 2024-06-22 DIAGNOSIS — K429 Umbilical hernia without obstruction or gangrene: Secondary | ICD-10-CM

## 2024-06-24 ENCOUNTER — Other Ambulatory Visit (HOSPITAL_BASED_OUTPATIENT_CLINIC_OR_DEPARTMENT_OTHER): Payer: Self-pay | Admitting: Internal Medicine

## 2024-06-24 DIAGNOSIS — M25532 Pain in left wrist: Secondary | ICD-10-CM
